# Patient Record
Sex: Female | Born: 1935 | Race: White | Hispanic: No | Marital: Married | Smoking: Former smoker
Health system: Southern US, Community
[De-identification: ages and names within clinical notes are randomized; demographics above are authoritative.]

## PROBLEM LIST (undated history)

## (undated) DIAGNOSIS — Z860100 Personal history of colon polyps, unspecified: Secondary | ICD-10-CM

## (undated) DIAGNOSIS — N951 Menopausal and female climacteric states: Secondary | ICD-10-CM

## (undated) DIAGNOSIS — K5730 Diverticulosis of large intestine without perforation or abscess without bleeding: Secondary | ICD-10-CM

## (undated) DIAGNOSIS — E039 Hypothyroidism, unspecified: Secondary | ICD-10-CM

## (undated) DIAGNOSIS — F319 Bipolar disorder, unspecified: Secondary | ICD-10-CM

## (undated) DIAGNOSIS — G4733 Obstructive sleep apnea (adult) (pediatric): Secondary | ICD-10-CM

## (undated) DIAGNOSIS — F32A Depression, unspecified: Secondary | ICD-10-CM

## (undated) DIAGNOSIS — F329 Major depressive disorder, single episode, unspecified: Secondary | ICD-10-CM

## (undated) DIAGNOSIS — E785 Hyperlipidemia, unspecified: Secondary | ICD-10-CM

## (undated) HISTORY — DX: Major depressive disorder, single episode, unspecified: F32.9

## (undated) HISTORY — DX: Diverticulosis of large intestine without perforation or abscess without bleeding: K57.30

## (undated) HISTORY — DX: Obstructive sleep apnea (adult) (pediatric): G47.33

## (undated) HISTORY — PX: TONSILLECTOMY AND ADENOIDECTOMY: SHX28

## (undated) HISTORY — PX: COLONOSCOPY: SHX174

## (undated) HISTORY — DX: Menopausal and female climacteric states: N95.1

## (undated) HISTORY — DX: Hypothyroidism, unspecified: E03.9

## (undated) HISTORY — DX: Bipolar disorder, unspecified: F31.9

## (undated) HISTORY — PX: POLYPECTOMY: SHX149

## (undated) HISTORY — DX: Hyperlipidemia, unspecified: E78.5

## (undated) HISTORY — DX: Depression, unspecified: F32.A

## (undated) HISTORY — DX: Personal history of colonic polyps: Z86.010

## (undated) HISTORY — PX: APPENDECTOMY: SHX54

## (undated) HISTORY — DX: Personal history of colon polyps, unspecified: Z86.0100

---

## 1998-04-08 ENCOUNTER — Ambulatory Visit (HOSPITAL_COMMUNITY): Admission: RE | Admit: 1998-04-08 | Discharge: 1998-04-08 | Payer: Self-pay | Admitting: Gynecology

## 1999-03-13 ENCOUNTER — Other Ambulatory Visit: Admission: RE | Admit: 1999-03-13 | Discharge: 1999-03-13 | Payer: Self-pay | Admitting: Gynecology

## 2000-02-29 ENCOUNTER — Other Ambulatory Visit: Admission: RE | Admit: 2000-02-29 | Discharge: 2000-02-29 | Payer: Self-pay | Admitting: Gynecology

## 2001-03-03 ENCOUNTER — Other Ambulatory Visit: Admission: RE | Admit: 2001-03-03 | Discharge: 2001-03-03 | Payer: Self-pay | Admitting: Gynecology

## 2001-07-09 ENCOUNTER — Encounter (INDEPENDENT_AMBULATORY_CARE_PROVIDER_SITE_OTHER): Payer: Self-pay | Admitting: Specialist

## 2001-07-10 ENCOUNTER — Inpatient Hospital Stay (HOSPITAL_COMMUNITY): Admission: EM | Admit: 2001-07-10 | Discharge: 2001-07-14 | Payer: Self-pay | Admitting: Emergency Medicine

## 2002-03-04 ENCOUNTER — Other Ambulatory Visit: Admission: RE | Admit: 2002-03-04 | Discharge: 2002-03-04 | Payer: Self-pay | Admitting: Gynecology

## 2003-03-12 ENCOUNTER — Other Ambulatory Visit: Admission: RE | Admit: 2003-03-12 | Discharge: 2003-03-12 | Payer: Self-pay | Admitting: Gynecology

## 2004-03-20 ENCOUNTER — Other Ambulatory Visit: Admission: RE | Admit: 2004-03-20 | Discharge: 2004-03-20 | Payer: Self-pay | Admitting: Gynecology

## 2004-04-19 ENCOUNTER — Ambulatory Visit (HOSPITAL_BASED_OUTPATIENT_CLINIC_OR_DEPARTMENT_OTHER): Admission: RE | Admit: 2004-04-19 | Discharge: 2004-04-19 | Payer: Self-pay | Admitting: Gynecology

## 2004-09-29 ENCOUNTER — Other Ambulatory Visit: Admission: RE | Admit: 2004-09-29 | Discharge: 2004-09-29 | Payer: Self-pay | Admitting: Gynecology

## 2005-04-05 ENCOUNTER — Other Ambulatory Visit: Admission: RE | Admit: 2005-04-05 | Discharge: 2005-04-05 | Payer: Self-pay | Admitting: Gynecology

## 2005-09-24 ENCOUNTER — Other Ambulatory Visit: Admission: RE | Admit: 2005-09-24 | Discharge: 2005-09-24 | Payer: Self-pay | Admitting: Gynecology

## 2006-02-11 ENCOUNTER — Ambulatory Visit: Payer: Self-pay | Admitting: Internal Medicine

## 2006-02-25 ENCOUNTER — Ambulatory Visit: Payer: Self-pay | Admitting: Internal Medicine

## 2006-03-22 ENCOUNTER — Ambulatory Visit: Payer: Self-pay | Admitting: Gastroenterology

## 2006-04-01 ENCOUNTER — Encounter: Payer: Self-pay | Admitting: Internal Medicine

## 2006-04-02 ENCOUNTER — Other Ambulatory Visit: Admission: RE | Admit: 2006-04-02 | Discharge: 2006-04-02 | Payer: Self-pay | Admitting: Gynecology

## 2006-04-05 ENCOUNTER — Encounter: Payer: Self-pay | Admitting: Gastroenterology

## 2006-04-05 ENCOUNTER — Encounter: Payer: Self-pay | Admitting: Internal Medicine

## 2006-04-05 ENCOUNTER — Ambulatory Visit: Payer: Self-pay | Admitting: Gastroenterology

## 2006-04-05 LAB — HM COLONOSCOPY

## 2006-08-28 ENCOUNTER — Ambulatory Visit: Payer: Self-pay | Admitting: Internal Medicine

## 2006-10-01 ENCOUNTER — Other Ambulatory Visit: Admission: RE | Admit: 2006-10-01 | Discharge: 2006-10-01 | Payer: Self-pay | Admitting: Gynecology

## 2007-02-26 ENCOUNTER — Encounter: Payer: Self-pay | Admitting: Internal Medicine

## 2007-02-26 DIAGNOSIS — N951 Menopausal and female climacteric states: Secondary | ICD-10-CM

## 2007-02-26 DIAGNOSIS — F319 Bipolar disorder, unspecified: Secondary | ICD-10-CM

## 2007-02-26 DIAGNOSIS — E039 Hypothyroidism, unspecified: Secondary | ICD-10-CM | POA: Insufficient documentation

## 2007-02-26 DIAGNOSIS — Z86010 Personal history of colonic polyps: Secondary | ICD-10-CM

## 2007-02-26 DIAGNOSIS — K5730 Diverticulosis of large intestine without perforation or abscess without bleeding: Secondary | ICD-10-CM

## 2007-02-26 DIAGNOSIS — E785 Hyperlipidemia, unspecified: Secondary | ICD-10-CM

## 2007-02-27 ENCOUNTER — Encounter: Payer: Self-pay | Admitting: Internal Medicine

## 2007-02-27 ENCOUNTER — Ambulatory Visit: Payer: Self-pay | Admitting: Internal Medicine

## 2007-04-08 ENCOUNTER — Encounter: Payer: Self-pay | Admitting: Internal Medicine

## 2007-09-18 ENCOUNTER — Encounter: Payer: Self-pay | Admitting: Internal Medicine

## 2008-02-26 ENCOUNTER — Ambulatory Visit: Payer: Self-pay | Admitting: Internal Medicine

## 2008-02-26 DIAGNOSIS — F329 Major depressive disorder, single episode, unspecified: Secondary | ICD-10-CM

## 2008-02-26 LAB — CONVERTED CEMR LAB
ALT: 21 units/L (ref 0–35)
AST: 20 units/L (ref 0–37)
Albumin: 3.6 g/dL (ref 3.5–5.2)
BUN: 21 mg/dL (ref 6–23)
Basophils Relative: 0.3 % (ref 0.0–1.0)
Chloride: 106 meq/L (ref 96–112)
Creatinine, Ser: 0.8 mg/dL (ref 0.4–1.2)
Eosinophils Absolute: 0.1 10*3/uL (ref 0.0–0.7)
Eosinophils Relative: 1.7 % (ref 0.0–5.0)
GFR calc non Af Amer: 75 mL/min
HCT: 37.3 % (ref 36.0–46.0)
Hemoglobin: 13.1 g/dL (ref 12.0–15.0)
MCV: 94 fL (ref 78.0–100.0)
Neutrophils Relative %: 60.4 % (ref 43.0–77.0)
RBC: 3.96 M/uL (ref 3.87–5.11)
TSH: 3.04 microintl units/mL (ref 0.35–5.50)
WBC: 6.7 10*3/uL (ref 4.5–10.5)

## 2008-02-27 ENCOUNTER — Telehealth (INDEPENDENT_AMBULATORY_CARE_PROVIDER_SITE_OTHER): Payer: Self-pay

## 2009-02-24 ENCOUNTER — Encounter: Payer: Self-pay | Admitting: Internal Medicine

## 2009-02-25 ENCOUNTER — Ambulatory Visit: Payer: Self-pay | Admitting: Internal Medicine

## 2009-02-25 LAB — CONVERTED CEMR LAB
Albumin: 3.5 g/dL (ref 3.5–5.2)
Basophils Absolute: 0 10*3/uL (ref 0.0–0.1)
CO2: 28 meq/L (ref 19–32)
Chloride: 108 meq/L (ref 96–112)
Eosinophils Absolute: 0.1 10*3/uL (ref 0.0–0.7)
Glucose, Bld: 97 mg/dL (ref 70–99)
HCT: 35.7 % — ABNORMAL LOW (ref 36.0–46.0)
HDL: 36 mg/dL — ABNORMAL LOW (ref >39.00)
Hemoglobin: 12.4 g/dL (ref 12.0–15.0)
Lymphs Abs: 1.9 10*3/uL (ref 0.7–4.0)
MCHC: 34.7 g/dL (ref 30.0–36.0)
MCV: 94.9 fL (ref 78.0–100.0)
Neutro Abs: 3.3 10*3/uL (ref 1.4–7.7)
Potassium: 4.2 meq/L (ref 3.5–5.1)
RDW: 12.6 % (ref 11.5–14.6)
Sodium: 143 meq/L (ref 135–145)
TSH: 10.16 microintl units/mL — ABNORMAL HIGH (ref 0.35–5.50)

## 2009-02-28 ENCOUNTER — Telehealth: Payer: Self-pay | Admitting: Internal Medicine

## 2010-01-10 ENCOUNTER — Encounter: Payer: Self-pay | Admitting: Internal Medicine

## 2010-02-28 ENCOUNTER — Ambulatory Visit: Payer: Self-pay | Admitting: Internal Medicine

## 2010-02-28 DIAGNOSIS — N764 Abscess of vulva: Secondary | ICD-10-CM

## 2010-03-14 ENCOUNTER — Telehealth: Payer: Self-pay

## 2010-04-25 ENCOUNTER — Encounter: Payer: Self-pay | Admitting: Internal Medicine

## 2010-09-17 LAB — CONVERTED CEMR LAB
Albumin: 4 g/dL (ref 3.5–5.2)
Alkaline Phosphatase: 56 units/L (ref 39–117)
Basophils Absolute: 0 10*3/uL (ref 0.0–0.1)
Basophils Relative: 0.2 % (ref 0.0–1.0)
Bilirubin, Direct: 0.1 mg/dL (ref 0.0–0.3)
CO2: 27 meq/L (ref 19–32)
CO2: 28 meq/L (ref 19–32)
Calcium: 9.1 mg/dL (ref 8.4–10.5)
Chloride: 109 meq/L (ref 96–112)
Cholesterol: 164 mg/dL (ref 0–200)
Creatinine, Ser: 0.7 mg/dL (ref 0.4–1.2)
Direct LDL: 103.4 mg/dL
Eosinophils Absolute: 0.1 10*3/uL (ref 0.0–0.6)
Eosinophils Absolute: 0.1 10*3/uL (ref 0.0–0.7)
Eosinophils Relative: 1.4 % (ref 0.0–5.0)
GFR calc Af Amer: 127 mL/min
Glucose, Bld: 93 mg/dL (ref 70–99)
Glucose, Bld: 96 mg/dL (ref 70–99)
HDL: 43.3 mg/dL (ref >39.00)
Hemoglobin: 13.2 g/dL (ref 12.0–15.0)
Hemoglobin: 13.5 g/dL (ref 12.0–15.0)
Lymphocytes Relative: 29.4 % (ref 12.0–46.0)
Lymphocytes Relative: 31.9 % (ref 12.0–46.0)
Lymphs Abs: 2 10*3/uL (ref 0.7–4.0)
MCHC: 34.9 g/dL (ref 30.0–36.0)
MCV: 94.6 fL (ref 78.0–100.0)
MCV: 95 fL (ref 78.0–100.0)
Monocytes Absolute: 0.3 10*3/uL (ref 0.1–1.0)
Monocytes Absolute: 0.5 10*3/uL (ref 0.2–0.7)
Monocytes Relative: 7.9 % (ref 3.0–11.0)
Neutro Abs: 3.5 10*3/uL (ref 1.4–7.7)
Neutro Abs: 4.4 10*3/uL (ref 1.4–7.7)
Potassium: 4 meq/L (ref 3.5–5.1)
RDW: 12.7 % (ref 11.5–14.6)
Sodium: 143 meq/L (ref 135–145)
TSH: 1.57 microintl units/mL (ref 0.35–5.50)
TSH: 2.86 microintl units/mL (ref 0.35–5.50)
Total CK: 307 units/L (ref 7–177)
Total Protein: 6.8 g/dL (ref 6.0–8.3)
Total Protein: 6.9 g/dL (ref 6.0–8.3)
Triglycerides: 142 mg/dL (ref 0.0–149.0)
VLDL: 31 mg/dL (ref 0–40)
WBC: 6 10*3/uL (ref 4.5–10.5)

## 2010-09-21 NOTE — Letter (Signed)
Summary: Gynecology-Dr. Beather Arbour  Gynecology-Dr. Beather Arbour   Imported By: Maryln Gottron 01/17/2010 13:32:49  _____________________________________________________________________  External Attachment:    Type:   Image     Comment:   External Document

## 2010-09-21 NOTE — Assessment & Plan Note (Signed)
Summary: pt will come in fasting/njr   Vital Signs:  Patient profile:   76 year old female Height:      62 inches Weight:      197 pounds BMI:     36.16 Temp:     97.9 degrees F oral BP sitting:   118 / 70  (right arm) Cuff size:   regular  Vitals Entered By: Duard Brady LPN (February 28, 2010 8:33 AM) CC: cpx - doing well  Is Patient Diabetic? No   CC:  cpx - doing well .  History of Present Illness: 76 year old patient who is seen today for a comprehensive evaluation.  She has a history of depression and is followed by psychiatry.  She is followed by Dr. Nicholas Lose for her gynecologic care.  She has mild dyslipidemia, hypothyroidism, and a history of colonic polyps.  She remains on Lipitor 10 mg daily Here for Medicare AWV:  1.   Risk factors based on Past M, S, F history:  personal risk factors include dyslipidemia.  She has a family history of coronary artery disease and a personal history of colonic polyps. 2.   Physical Activities: no activity restrictions, fairly sedentary 3.   Depression/mood: history of bipolar depression, which has been stable followed by psychiatry 4.   Hearing: no hearing deficits 5.   ADL's: completely independent in all aspects of daily living 6.   Fall Risk: low 7.   Home Safety: no problems identified 8.   Height, weight, &visual acuity:no change in high at or weight.  Visual acuity is normal.  Does have annual eye examinations 9.   Counseling: heart healthy diet regular.  Exercise and modest weight loss.  All encouraged 10.   Labs ordered based on risk factors: complete laboratory profile, including lipid panel will be reviewed 11.           Referral Coordination- mammogram this fall 12.           Care Plan- heart healthy diet calorie restricted diet and exercise regimen.  All encouraged 13.            Cognitive Assessment- alert and oriented, with normal affect   Allergies (verified): No Known Drug Allergies  Past History:  Past Medical  History: Colonic polyps, hx of Depression ( Dr Raquel James) Diverticulosis, colon Hyperlipidemia Hypothyroidism menopausal syndrome OSA (CPAP) 17 cm (Clint Young)  Past Surgical History: Reviewed history from 02/26/2008 and no changes required. Appendectomy 2002 Thyroidectomy colonoscopy August 2007  Family History: Reviewed history from 02/26/2008 and no changes required. father died att 41, complications of coronary artery disease mother died at 73-history of diabetes one brother, status post CABG Family History of CAD Female 1st degree relative <50 family history also positive for breast and lung cancer  Social History: Reviewed history from 02/27/2007 and no changes required. married two daughters, 4 grandchildren  Review of Systems  The patient denies anorexia, fever, weight loss, weight gain, vision loss, decreased hearing, hoarseness, chest pain, syncope, dyspnea on exertion, peripheral edema, prolonged cough, headaches, hemoptysis, abdominal pain, melena, hematochezia, severe indigestion/heartburn, hematuria, incontinence, genital sores, muscle weakness, suspicious skin lesions, transient blindness, difficulty walking, depression, unusual weight change, abnormal bleeding, enlarged lymph nodes, angioedema, and breast masses.    Physical Exam  General:  overweight-appearing.  110/72overweight-appearing.   Head:  Normocephalic and atraumatic without obvious abnormalities. No apparent alopecia or balding. Eyes:  No corneal or conjunctival inflammation noted. EOMI. Perrla. Funduscopic exam benign, without hemorrhages, exudates or papilledema. Vision grossly  normal. Ears:  External ear exam shows no significant lesions or deformities.  Otoscopic examination reveals clear canals, tympanic membranes are intact bilaterally without bulging, retraction, inflammation or discharge. Hearing is grossly normal bilaterally. Mouth:  Oral mucosa and oropharynx without lesions or exudates.  Teeth  in good repair. Neck:  No deformities, masses, or tenderness noted. Chest Wall:  No deformities, masses, or tenderness noted. Breasts:  No mass, nodules, thickening, tenderness, bulging, retraction, inflamation, nipple discharge or skin changes noted.   Lungs:  Normal respiratory effort, chest expands symmetrically. Lungs are clear to auscultation, no crackles or wheezes. Heart:  Normal rate and regular rhythm. S1 and S2 normal without gallop, murmur, click, rub or other extra sounds. Abdomen:  Bowel sounds positive,abdomen soft and non-tender without masses, organomegaly or hernias noted. Msk:  No deformity or scoliosis noted of thoracic or lumbar spine.   Pulses:  R and L carotid,radial,femoral,dorsalis pedis and posterior tibial pulses are full and equal bilaterally Extremities:  No clubbing, cyanosis, edema, or deformity noted with normal full range of motion of all joints.   Neurologic:  No cranial nerve deficits noted. Station and gait are normal. Plantar reflexes are down-going bilaterally. DTRs are symmetrical throughout. Sensory, motor and coordinative functions appear intact. Skin:  Intact without suspicious lesions or rashes Cervical Nodes:  No lymphadenopathy noted Axillary Nodes:  No palpable lymphadenopathy Inguinal Nodes:  No significant adenopathy Psych:  Cognition and judgment appear intact. Alert and cooperative with normal attention span and concentration. No apparent delusions, illusions, hallucinations   Impression & Recommendations:  Problem # 1:  HEALTH MAINTENANCE EXAM (ICD-V70.0)  Orders: EKG w/ Interpretation (93000) First annual wellness visit with prevention plan  (Z6109)  Complete Medication List: 1)  Vivelle-dot 0.0375 Mg/24hr Pttw (Estradiol) .Marland Kitchen.. 1 2x week 2)  Lipitor 10 Mg Tabs (Atorvastatin calcium) .Marland Kitchen.. 1 once daily 3)  Divalproex Sodium 250 Mg Tbec (Divalproex sodium) .... 3 once daily 4)  Prometrium 200 Mg Caps (Progesterone micronized) .... Uad 5)   Clonazepam 1 Mg Tabs (Clonazepam) .Marland Kitchen.. 1 at bedtime 6)  Levothyroxine Sodium 100 Mcg Tabs (Levothyroxine sodium) .Marland Kitchen.. 1 once daily 7)  Prometrium 200 Mg Caps (Progesterone micronized) .... As directed  Other Orders: Venipuncture ([address]) TLB-Lipid Panel (80061-LIPID) TLB-BMP (Basic Metabolic Panel-BMET) (80048-METABOL) TLB-CBC Platelet - w/Differential (85025-CBCD) TLB-Hepatic/Liver Function Pnl (80076-HEPATIC) TLB-TSH (Thyroid Stimulating Hormone) (09811-BJY)  Patient Instructions: 1)  Limit your Sodium (Salt). 2)  It is important that you exercise regularly at least 20 minutes 5 times a week. If you develop chest pain, have severe difficulty breathing, or feel very tired , stop exercising immediately and seek medical attention. 3)  You need to lose weight. Consider a lower calorie diet and regular exercise.  4)  Take 400-600mg  of Ibuprofen (Advil, Motrin) with food every 4-6 hours as needed for relief of pain or comfort of fever. Prescriptions: LEVOTHYROXINE SODIUM 100 MCG TABS (LEVOTHYROXINE SODIUM) 1 once daily  #90.0 Each x 6   Entered and Authorized by:   Gordy Savers  MD   Signed by:   Gordy Savers  MD on 02/28/2010   Method used:   Electronically to        [facility] [id number]* (retail)       [address] [place], [place]  [address]       Ph: [phone]       Fax: [phone]   RxID:   [id number] LIPITOR 10 MG  TABS (ATORVASTATIN  CALCIUM) 1 once daily  #90.0 Each x 6   Entered and Authorized by:   Gordy Savers  MD   Signed by:   Gordy Savers  MD on 02/28/2010   Method used:   Electronically to        [facility] [id number]* (retail)       [address] [place], [place]  [address]       Ph: [phone]       Fax: [phone]   RxID:   [id number]   Appended Document: pt will come in fasting/njr Addendum:      1.  Depression- contiue psychiatric f/u      2.  Dyslipidemia-continue lipitor      3.   Hypothyroidism- continue levothyroxine

## 2010-09-21 NOTE — Progress Notes (Signed)
Summary: labwork mail  Phone Note Call from Patient Call back at Home Phone [phone]   Caller: Patient Call For: Gordy Savers  MD Summary of Call: Pt would like a copy of labwork mail to her. Initial call taken by: Heron Sabins,  March 14, 2010 9:38 AM  Follow-up for Phone Call        labs mailed to home address as requestedKIK Follow-up by: Duard Brady LPN,  March 14, 2010 10:11 AM

## 2010-10-12 ENCOUNTER — Other Ambulatory Visit: Payer: Self-pay | Admitting: Gynecology

## 2011-01-05 NOTE — Op Note (Signed)
[facility]  Patient:    Emily Castaneda, Emily Castaneda Visit Number: [id number] MRN: [id number]          Service Type: SUR Location: 3W 0356 01 Attending Physician:  Meredith Leeds Dictated by:   Zigmund Daniel, M.D. Proc. Date: 07/09/01 Admit Date:  07/09/2001                             Operative Report  PREOPERATIVE DIAGNOSIS:  Acute appendicitis.  POSTOPERATIVE DIAGNOSIS:  Acute appendicitis with perforation and peritonitis.  OPERATION:  Appendectomy.  SURGEON:  Zigmund Daniel, M.D.  ANESTHESIA:  General.  DESCRIPTION OF PROCEDURE:  After the patient was anesthetized, monitored, and had a Foley catheter, I palpated the abdomen, and found a large mass in the right lower quadrant.  It was evident to me that laparoscopic surgery would be difficult and hazardous, so I made a lower midline incision, and entered the abdomen, and removed fluid which was present.  It was cloudy, slightly smelly fluid.  There was obvious peritonitis around the small bowel and right lower quadrant, but no feces was noted.  I found inflammation in the right lower quadrant.  The sigmoid appeared normal.  There was an obvious perforated appendix.  I mobilized the cecum by incising peritoneum to it, and inferior to it, and got it mobilized enough to see the acutely inflamed and perforated appendix.  It was partly walled off by the ileum, so the peritonitis was less than it could have been by the localized reaction.  I clamped the mesoappendix with a Kelly clamp and divided it, and suture ligated the appendiceal artery with 2-0 Vicryl.  I suture ligated the base of the appendix with a 2-0 Vicryl suture ligature.  I amputated the appendix and cauterized the mucosa of the stump.  The appendix was healthy at the base and the appearance of this appeared secure.  I got good hemostasis and then copiously irrigated the local area.  I irrigated the abdominal cavity with about 2 L  of fluid and removed the irrigant.  Hemostasis was good.  Sponge, needle, and instrument counts were correct.  No other abdominal abnormalities were noted.  I closed the fascia with running #1 PDS and loosely closed the skin with 3-0 nylon, packing gauze into the _________ between the closure.  The patient tolerated the operation well. Dictated by:   Zigmund Daniel, M.D. Attending Physician:  Meredith Leeds DD:  07/09/01 TD:  07/10/01 Job: [id number]/[id number]

## 2011-01-05 NOTE — Letter (Signed)
December 12, 2006     RE:  ZARRIA, TOWELL  MRN:  [id number]  /  DOB:  [birth year]   To whom it may concern,   Ladesha Pacini. Woodbeck is a patient followed in our internal medicine practice.  She has a number of medical problems including bipolar disorder and is  treated with multiple drugs.  This office feels that she may benefit  from a driving school to assist with safety issues.    Sincerely,      Gordy Savers, MD  Electronically Signed    PFK/MedQ  DD: 12/12/2006  DT: 12/12/2006  Job #: [id number]

## 2011-01-05 NOTE — Discharge Summary (Signed)
[facility]  Patient:    LAVETA, GILKEY Visit Number: [id number] MRN: [id number]          Service Type: SUR Location: 3W 0356 01 Attending Physician:  Meredith Leeds Dictated by:   Zigmund Daniel, M.D. Admit Date:  07/09/2001 Discharge Date: 07/14/2001                             Discharge Summary  HISTORY OF PRESENT ILLNESS:  The patient is a 76 year old white female with several days of increasing abdominal pain.  She was found to have tenderness in the right lower quadrant and a white count of 28,000.  Appendicitis or some other acute abdominal process was suspected, and I was consulted.  I agreed, and advised exploration with probable appendectomy.  The patients general health is quite good.  She suffers from bipolar illness, and that has been stable lately.  HOSPITAL COURSE:  After fluid resuscitation and administration of IV antibiotics, I took the patient to the operating room, and after she was anesthetized I found that there was a very large right lower quadrant mass.  I explored her through a lower midline incision, finding acute appendicitis with perforation, abscess, and peritonitis.  I was able to do an appendectomy rather then a colectomy, and then copiously irrigated the abdominal cavity. The patient did quite well postoperatively with pretty early return of GI function, and was eating and feeling well by the fourth postoperative day. Her bowels had moved.  She was afebrile, and white count had returned to normal.  I sent her home after removal of the sutures, and application of Steri-Strips.  She was given pain medication and oral antibiotic.  She was to call if there was a fever, wound redness or drainage, or other problem.  DIAGNOSIS:  Acute appendicitis with abscess and peritonitis.  OPERATION:  Appendectomy.  CONDITION ON DISCHARGE:  Improved. Dictated by:   Zigmund Daniel, M.D. Attending Physician:  Meredith Leeds DD:  07/24/01 TD:  07/24/01 Job: [id number]/[id number]

## 2011-01-05 NOTE — H&P (Signed)
[facility]  Patient:    Emily Castaneda, Emily Castaneda Visit Number: [id number] MRN: [id number]          Service Type: SUR Location: 3W 0356 01 Attending Physician:  Meredith Leeds Dictated by:   Zigmund Daniel, M.D. Admit Date:  07/09/2001 Discharge Date: 07/14/2001                           History and Physical  CHIEF COMPLAINT:  Abdominal pain.  HISTORY OF PRESENT ILLNESS:  The patient is a 76 year old white female with a white count of 28,000, and right lower quadrant pain and tenderness progressive over a few days.  There was also anorexia, previous nausea, vomiting, and diarrhea.  She had no chronic GI disease.  PAST MEDICAL HISTORY:  Bipolar illness, and is on Depakote, klonopin, and she also take Synthroid for hypothyroidism.  SOCIAL HISTORY:  She does not smoke.  She drinks socially and moderately.  She denies serious chronic aliments.  Review of systems, family history, childhood illnesses, unremarkable.  PHYSICAL EXAMINATION:  GENERAL:  The patient is a little bit overweight.  Mental status was normal.  VITAL SIGNS:  Normal as recorded by the nurse.  HEENT:  Unremarkable.  NECK:  Unremarkable.  CHEST:  Clear to auscultation.  BREASTS:  Normal.  HEART:  Regular rate and rhythm.  No murmur or gallop.  ABDOMEN:  Tender in the right lower quadrant with rebound tenderness.  RECTAL:  Not performed.  PELVIC:  Not performed.  The remainder of the examination unremarkable.  IMPRESSION:  Acute appendicitis.  PLAN:  Appendectomy. Dictated by:   Zigmund Daniel, M.D. Attending Physician:  Meredith Leeds DD:  07/30/01 TD:  07/30/01 Job: [id number]

## 2011-01-05 NOTE — Procedures (Signed)
NAME:  BRINLYN, CENA              ACCOUNT NO.:  [id number]   MEDICAL RECORD NO.:  [id number]          PATIENT TYPE:  OUT   LOCATION:  SLEEP CENTER                 FACILITY:  [facility]   PHYSICIAN:  Clinton D. Maple Hudson, M.D. DATE OF BIRTH:  03/16/1935   DATE OF ADMISSION:  04/19/2004  DATE OF DISCHARGE:  04/19/2004                              NOCTURNAL POLYSOMNOGRAM   REFERRING PHYSICIAN:  Luvenia Redden, M.D.   INDICATIONS FOR STUDY/HISTORY:  Hypersomnia with sleep apnea.  Excessive  daytime somnolence.  Complaint of insufficient energy with additional  diagnosis of bipolar disorder.  An Epworth Sleepiness Scale of 11/24.  Neck  size 20 inches.  BMI 32.8.  Weight 183 pounds.   MEDICATIONS:  Depakote, Lipitor, Synthroid, clonazepam, aspirin, Tylenol,  multivitamins.   SLEEP ARCHITECTURE:  Total sleep time 301 minutes with sleep efficiency of  77%.  Stage I was 20%.  Stage II 64%.  Stages III and IV absent.  REM was  16% of total sleep time.  Latency to sleep onset 30 minutes.  Latency to REM  217 minutes.  Awake after sleep onset 59 minutes.  Arousal index 46.   RESPIRATORY DATA:  Split study protocol.  RDI 75 per hour before CPAP,  indicating severe obstructive sleep apnea/hypopnea syndrome.  This included  137 obstructive apneas and 39 hypopneas before CPAP.  The events were not  positional.  REM RDI was 50 per hour.  CPAP was titrated to suggested  initial home pressure of 17 CWP (RDI 0 per hour).  The technician took her  up to 20 CWP because of some breakthrough snoring.  That experience suggests  that comfort would be difficult to maintain at higher pressures, and an  initial home trial of 17 CWP is recommended.  A medium Respiron full-face  mask was utilized with a heated humidifier.   OXYGEN DATA:  Moderately loud snoring before CPAP with moderate oxygen  desaturation to 77%.  After CPAP oxygen saturation held at 93-95%.   CARDIAC DATA:  Sinus rhythm with occasional PAC.   MOVEMENT/PARASOMNIA:  Occasional leg jerk without significant effect on  sleep.   IMPRESSION/RECOMMENDATIONS:  Severe obstructive sleep apnea/hypopnea  syndrome, Respiratory Disturbance Index of 75 per hour with moderate oxygen  desaturation and loud snoring.  CPAP control was achieved at 17 CWP using a  medication Respironics comfort gel, full face mask with heated humidifier.                                  ______________________________                                Rennis Chris Maple Hudson, M.D.                                Diplomate, American Board of Sleep Medicine   CDY/MEDQ  D:  04/23/2004 13:27:03  T:  04/24/2004 19:53:08  Job:  [id number]

## 2011-01-23 ENCOUNTER — Other Ambulatory Visit: Payer: Self-pay | Admitting: Gynecology

## 2011-03-12 ENCOUNTER — Encounter: Payer: Self-pay | Admitting: Internal Medicine

## 2011-03-13 ENCOUNTER — Encounter: Payer: Self-pay | Admitting: Internal Medicine

## 2011-03-13 ENCOUNTER — Ambulatory Visit (INDEPENDENT_AMBULATORY_CARE_PROVIDER_SITE_OTHER): Payer: Medicare Other | Admitting: Internal Medicine

## 2011-03-13 VITALS — BP 110/70 | HR 72 | Temp 97.5°F | Resp 16 | Ht 63.0 in | Wt 193.0 lb

## 2011-03-13 DIAGNOSIS — Z860100 Personal history of colon polyps, unspecified: Secondary | ICD-10-CM

## 2011-03-13 DIAGNOSIS — Z0000 Encounter for general adult medical examination without abnormal findings: Secondary | ICD-10-CM

## 2011-03-13 DIAGNOSIS — E785 Hyperlipidemia, unspecified: Secondary | ICD-10-CM

## 2011-03-13 DIAGNOSIS — E039 Hypothyroidism, unspecified: Secondary | ICD-10-CM

## 2011-03-13 DIAGNOSIS — N951 Menopausal and female climacteric states: Secondary | ICD-10-CM

## 2011-03-13 DIAGNOSIS — F3289 Other specified depressive episodes: Secondary | ICD-10-CM

## 2011-03-13 DIAGNOSIS — F329 Major depressive disorder, single episode, unspecified: Secondary | ICD-10-CM

## 2011-03-13 DIAGNOSIS — K5730 Diverticulosis of large intestine without perforation or abscess without bleeding: Secondary | ICD-10-CM

## 2011-03-13 LAB — CBC WITH DIFFERENTIAL/PLATELET
Basophils Absolute: 0 10*3/uL (ref 0.0–0.1)
Eosinophils Absolute: 0.1 10*3/uL (ref 0.0–0.7)
HCT: 38.1 % (ref 36.0–46.0)
Lymphs Abs: 1.7 10*3/uL (ref 0.7–4.0)
MCV: 94.6 fl (ref 78.0–100.0)
Monocytes Absolute: 0.4 10*3/uL (ref 0.1–1.0)
Neutrophils Relative %: 66.8 % (ref 43.0–77.0)
Platelets: 175 10*3/uL (ref 150.0–400.0)
RDW: 13.8 % (ref 11.5–14.6)

## 2011-03-13 LAB — HEPATIC FUNCTION PANEL
ALT: 21 U/L (ref 0–35)
AST: 21 U/L (ref 0–37)
Albumin: 3.8 g/dL (ref 3.5–5.2)
Alkaline Phosphatase: 43 U/L (ref 39–117)
Bilirubin, Direct: 0.1 mg/dL (ref 0.0–0.3)
Total Protein: 7 g/dL (ref 6.0–8.3)

## 2011-03-13 LAB — BASIC METABOLIC PANEL
BUN: 18 mg/dL (ref 6–23)
Calcium: 8.5 mg/dL (ref 8.4–10.5)
Chloride: 110 mEq/L (ref 96–112)
Creatinine, Ser: 0.7 mg/dL (ref 0.4–1.2)
GFR: 86.9 mL/min (ref >60.00)

## 2011-03-13 LAB — LIPID PANEL
Cholesterol: 154 mg/dL (ref 0–200)
Triglycerides: 169 mg/dL — ABNORMAL HIGH (ref 0.0–149.0)

## 2011-03-13 LAB — TSH: TSH: 2.82 u[IU]/mL (ref 0.35–5.50)

## 2011-03-13 MED ORDER — ATORVASTATIN CALCIUM 10 MG PO TABS: 10.0000 mg | ORAL_TABLET | Freq: Every day | ORAL | Status: DC

## 2011-03-13 MED ORDER — LEVOTHYROXINE SODIUM 100 MCG PO TABS: 100.0000 ug | ORAL_TABLET | Freq: Every day | ORAL | Status: DC

## 2011-03-13 NOTE — Progress Notes (Signed)
  Subjective:    Patient ID: Emily Castaneda, female    DOB: 1934-10-12, 76 y.o.   MRN: [id number]  HPI CC: cpx - doing well .   History of Present Illness:   76 year-old patient who is seen today for a comprehensive evaluation. She has a history of depression and is followed by psychiatry. She is followed by Dr. Nicholas Lose for her gynecologic care. She has mild dyslipidemia, hypothyroidism, and a history of colonic polyps. She remains on Lipitor 10 mg daily   Here for Medicare AWV:   1. Risk factors based on Past M, S, F history: personal risk factors include dyslipidemia. She has a family history of coronary artery disease and a personal history of colonic polyps.  2. Physical Activities: no activity restrictions, fairly sedentary  3. Depression/mood: history of bipolar depression, which has been stable followed by psychiatry  4. Hearing: no hearing deficits  5. ADL's: completely independent in all aspects of daily living  6. Fall Risk: low  7. Home Safety: no problems identified  8. Height, weight, &visual acuity:no change in high at or weight. Visual acuity is normal. Does have annual eye examinations  9. Counseling: heart healthy diet regular. Exercise and modest weight loss. All encouraged  10. Labs ordered based on risk factors: complete laboratory profile, including lipid panel will be reviewed  11. Referral Coordination- mammogram this fall  12. Care Plan- heart healthy diet calorie restricted diet and exercise regimen. All encouraged  13. Cognitive Assessment- alert and oriented, with normal affect   Allergies (verified):  No Known Drug Allergies   Past History:  Past Medical History:  Colonic polyps, hx of  Depression ( Dr Raquel James)  Diverticulosis, colon  Hyperlipidemia  Hypothyroidism  menopausal syndrome  OSA (CPAP) 17 cm (Clint Young)   Past Surgical History:  Reviewed history from 02/26/2008 and no changes required.  Appendectomy 2002  Thyroidectomy  colonoscopy  August 2007   Family History:  Reviewed history from 02/26/2008 and no changes required.  father died att 56, complications of coronary artery disease  mother died at 73-history of diabetes  one brother, status post CABG  Family History of CAD Female 1st degree relative <50  family history also positive for breast and lung cancer   Social History:  Reviewed history from 02/27/2007 and no changes required.  married two daughters, 4 grandchildren    Review of Systems     Objective:   Physical Exam        Assessment & Plan:

## 2011-03-13 NOTE — Patient Instructions (Signed)
Limit your sodium (Salt) intake  You need to lose weight.  Consider a lower calorie diet and regular exercise.    It is important that you exercise regularly, at least 20 minutes 3 to 4 times per week.  If you develop chest pain or shortness of breath seek  medical attention.  Schedule your colonoscopy to help detect colon cancer.  Return in one year for follow-up

## 2011-03-14 NOTE — Progress Notes (Signed)
Quick Note:  Copy faxed to Dr. Raquel James ______

## 2011-03-19 ENCOUNTER — Other Ambulatory Visit: Payer: Self-pay

## 2011-03-19 NOTE — Telephone Encounter (Signed)
rx sent in on 03/15/11 90x6 rf---verified.

## 2011-04-30 ENCOUNTER — Encounter: Payer: Self-pay | Admitting: Internal Medicine

## 2011-05-17 ENCOUNTER — Ambulatory Visit (HOSPITAL_BASED_OUTPATIENT_CLINIC_OR_DEPARTMENT_OTHER)
Admission: RE | Admit: 2011-05-17 | Discharge: 2011-05-17 | Disposition: A | Discharge status: Home / Self Care | Payer: Medicare Other | Source: Ambulatory Visit | Attending: Gynecology | Admitting: Gynecology

## 2011-05-17 ENCOUNTER — Other Ambulatory Visit: Payer: Self-pay | Admitting: Gynecology

## 2011-05-17 DIAGNOSIS — N950 Postmenopausal bleeding: Secondary | ICD-10-CM | POA: Insufficient documentation

## 2011-05-17 DIAGNOSIS — F319 Bipolar disorder, unspecified: Secondary | ICD-10-CM | POA: Insufficient documentation

## 2011-05-17 DIAGNOSIS — E039 Hypothyroidism, unspecified: Secondary | ICD-10-CM | POA: Insufficient documentation

## 2011-05-17 LAB — POCT HEMOGLOBIN-HEMACUE: Hemoglobin: 12.8 g/dL (ref 12.0–15.0)

## 2011-05-23 NOTE — Op Note (Signed)
  [name], Emily Castaneda                ACCOUNT NO.:  [id number]  MEDICAL RECORD NO.:  [id number]  LOCATION:                                 FACILITY:  PHYSICIAN:  Gretta Cool, M.D. DATE OF BIRTH:  1934-11-27  DATE OF PROCEDURE:  05/17/2011 DATE OF DISCHARGE:                              OPERATIVE REPORT   PREOPERATIVE DIAGNOSES: 1. Recurrent postmenopausal bleeding. 2. History of hysteroscopy, resection of endometrial polyp and     ablation in 1999. 3. Negative endometrial sampling outpatient office, but suspicious     endometrial ultrasound.  POSTOPERATIVE DIAGNOSES: 1. Recurrent postmenopausal bleeding. 2. History of hysteroscopy, resection of endometrial polyp and     ablation in 1999. 3. Negative endometrial sampling outpatient office, but suspicious     endometrial ultrasound.  PROCEDURE:  Hysteroscopy with resection of endometrial tissue, suspicious for hyperplasia or more.  SURGEON:  Gretta Cool, MD  ANESTHESIA:  IV sedation and paracervical block.  DESCRIPTION OF PROCEDURE:  Under excellent paracervical block and sedation as above, the patient prepped and draped in Allen stirrups. The cervix was grasped with a single-tooth tenaculum and pulled down into view.  It was progressively dilated with series Pratt dilators to accommodate 5 mm resectoscope.  The resectoscope was introduced and the cavity examined.  There was much loculation of the cavity, but there was endometrial looking tissue protruding from the apical areas.  That was resected and that opened up further endometrial cavity expose to the view of the hysteroscope.  That tissue was also resected as possible and submitted for pathologic exam.  Tissue was felt to be suspicious for endometrial hyperplasia or more.  At this point, the procedure was terminated without complication.  The patient returned to recovery room in excellent condition.          ______________________________ Gretta Cool, M.D.     CWL/MEDQ  D:  05/17/2011  T:  05/17/2011  Job:  [id number]  cc:   Gordy Savers, MD [address] [place] [address]  Electronically Signed by Beather Arbour M.D. on 05/23/2011 03:58:36 PM

## 2011-05-29 ENCOUNTER — Other Ambulatory Visit: Payer: Self-pay | Admitting: Internal Medicine

## 2011-09-07 ENCOUNTER — Telehealth: Payer: Self-pay | Admitting: Internal Medicine

## 2011-09-07 NOTE — Telephone Encounter (Signed)
i spoke with pt and she states she needs new cpap supplies from [facility]. Pt has not been seen in over 10 years by CDY. I advised pt she will need consult apt to get new supplies. Pt is scheduled to see RA 09/24/11 at 10:15. Pt aware to arrive 15 min early to fill out paper work and is aware of the address. Nothing further was needed

## 2011-09-24 ENCOUNTER — Ambulatory Visit (INDEPENDENT_AMBULATORY_CARE_PROVIDER_SITE_OTHER): Payer: Medicare Other | Admitting: Pulmonary Disease

## 2011-09-24 ENCOUNTER — Encounter: Payer: Self-pay | Admitting: Pulmonary Disease

## 2011-09-24 VITALS — BP 126/78 | HR 95 | Temp 98.2°F | Ht 63.0 in | Wt 194.0 lb

## 2011-09-24 DIAGNOSIS — Z9989 Dependence on other enabling machines and devices: Secondary | ICD-10-CM

## 2011-09-24 DIAGNOSIS — G4733 Obstructive sleep apnea (adult) (pediatric): Secondary | ICD-10-CM

## 2011-09-24 NOTE — Progress Notes (Signed)
  Subjective:    Patient ID: Emily Castaneda, female    DOB: 1935/02/06, 77 y.o.   MRN: [id number]  HPI PCP - [facility] - Jeralyn Bennett  76/F, bipolar disorder , hypothyroidism presents for management of OSA  PSG 8/05 showed severe OSA with RDI 75/h corrected by CPAP 17 cm ESS 4/24 Takes klonopin at bedtime, occ takes lunesta  Bedtime 10-30 -11pm, latency 20-30 mins, few nocturnal awakenings, no nocturia, oob at 0700, rested , no dry mouth, no headaches She has been compliant with CPAP with nasal pillows -but needs new hose, pillows. Her humidifer shows crusting & filter cover was taped up. She denies excessive daytime somnolence , dryness, pressure is ok - she used other interfaces before settling on nasal pillows. There is no history suggestive of cataplexy, sleep paralysis or parasomnias     Review of Systems  Constitutional: Negative for fever and unexpected weight change.  HENT: Positive for congestion, rhinorrhea, sneezing, postnasal drip and sinus pressure. Negative for ear pain, nosebleeds, sore throat, trouble swallowing and dental problem.   Eyes: Positive for itching. Negative for redness.  Respiratory: Negative for cough, chest tightness, shortness of breath and wheezing.   Cardiovascular: Negative for palpitations and leg swelling.  Gastrointestinal: Negative for nausea, vomiting and diarrhea.  Genitourinary: Negative for dysuria.  Musculoskeletal: Negative for joint swelling.  Skin: Negative for rash.  Neurological: Negative for headaches.  Hematological: Does not bruise/bleed easily.  Psychiatric/Behavioral: Positive for dysphoric mood. The patient is not nervous/anxious.        Objective:   Physical Exam  Gen. Pleasant, obese, in no distress, normal affect ENT - no lesions, no post nasal drip, class 2-3 airway Neck: No JVD, no thyromegaly, no carotid bruits Lungs: no use of accessory muscles, no dullness to percussion, decreased without rales or rhonchi    Cardiovascular: Rhythm regular, heart sounds  normal, no murmurs or gallops, no peripheral edema Abdomen: soft and non-tender, no hepatosplenomegaly, BS normal. Musculoskeletal: No deformities, no cyanosis or clubbing Neuro:  alert, non focal, no tremors       Assessment & Plan:

## 2011-09-24 NOTE — Patient Instructions (Signed)
We will get you CPAP supplies for your machine

## 2011-09-24 NOTE — Assessment & Plan Note (Signed)
Will get her new supplies - ct CPAP 17 cm with nasal pillows, chk download in 1 mnth Went over care of machine including filter & humidifier - she may need a new one Weight loss encouraged, compliance with goal of at least 4-6 hrs every night is the expectation. Advised against medications with sedative side effects Cautioned against driving when sleepy - understanding that sleepiness will vary on a day to day basis

## 2011-11-30 ENCOUNTER — Other Ambulatory Visit: Payer: Self-pay | Admitting: Internal Medicine

## 2012-02-01 ENCOUNTER — Other Ambulatory Visit: Payer: Self-pay | Admitting: Gynecology

## 2012-02-28 ENCOUNTER — Other Ambulatory Visit: Payer: Self-pay | Admitting: Internal Medicine

## 2012-03-04 ENCOUNTER — Telehealth: Payer: Self-pay | Admitting: Pulmonary Disease

## 2012-03-04 DIAGNOSIS — G4733 Obstructive sleep apnea (adult) (pediatric): Secondary | ICD-10-CM

## 2012-03-04 NOTE — Telephone Encounter (Signed)
Pt called back.  Pt stated she is concerned about obtaining her new CPAP machine.  Pt has had current CPAP machine for the past 6 years.  Pt stated that [facility] has provided her w/ a re-built motor for the time being.  Pt is going out of town on March 16, 2012 & would like to ensure she has a new CPAP prior to this date.  Advised pt that a nurse will call her back before COB today.    Antionette Fairy

## 2012-03-04 NOTE — Telephone Encounter (Signed)
Spoke with pt. She states that she is needing order sent to [facility] for a new CPAP machine. She states that her machine is 75 yrs old.  RA, please advise if okay to send order thanks!

## 2012-03-04 NOTE — Telephone Encounter (Signed)
Ok to send order. 

## 2012-03-20 ENCOUNTER — Telehealth: Payer: Self-pay

## 2012-03-20 DIAGNOSIS — E039 Hypothyroidism, unspecified: Secondary | ICD-10-CM

## 2012-03-20 MED ORDER — LEVOTHYROXINE SODIUM 100 MCG PO TABS: 100.0000 ug | ORAL_TABLET | Freq: Every day | ORAL | Status: DC

## 2012-03-20 NOTE — Telephone Encounter (Signed)
Needs to be seen - last seen 02/2011

## 2012-03-28 ENCOUNTER — Encounter: Payer: Medicare Other | Admitting: Internal Medicine

## 2012-04-01 ENCOUNTER — Encounter: Payer: Medicare Other | Admitting: Internal Medicine

## 2012-04-07 ENCOUNTER — Telehealth: Payer: Self-pay | Admitting: Internal Medicine

## 2012-04-07 ENCOUNTER — Encounter: Payer: Self-pay | Admitting: Family Medicine

## 2012-04-07 ENCOUNTER — Ambulatory Visit (INDEPENDENT_AMBULATORY_CARE_PROVIDER_SITE_OTHER): Payer: Medicare Other | Admitting: Family Medicine

## 2012-04-07 VITALS — BP 110/62 | HR 102 | Temp 99.2°F | Wt 192.0 lb

## 2012-04-07 DIAGNOSIS — J40 Bronchitis, not specified as acute or chronic: Secondary | ICD-10-CM

## 2012-04-07 MED ORDER — AMOXICILLIN-POT CLAVULANATE 875-125 MG PO TABS: 1.0000 | ORAL_TABLET | Freq: Two times a day (BID) | ORAL | Status: AC

## 2012-04-07 NOTE — Telephone Encounter (Signed)
Caller: Gene/Spouse; Patient Name: Emily Castaneda; PCP: Eleonore Chiquito; Best Callback Phone Number: [phone].  Pt reports she has been diagnosed with Acute Bronchitis (pt was seen at [facility]) and she hasnt slept since 03/26/12.  She reports continuous coughing and stopped up on left side.  Pt states the medications she was prescribed are not working.  All emergent symptoms of Upper Respiratory Infection Protocol ruled out with exception to 'Symptoms worsen after 7 days or symptoms do not improve after 14 days of home care'.  Home care advice given and appt scheduled for 11:15 with Dr Clent Ridges

## 2012-04-07 NOTE — Progress Notes (Signed)
  Subjective:    Patient ID: Emily Castaneda, female    DOB: Jul 24, 1935, 77 y.o.   MRN: [id number]  HPI Here for 12 days of stuffy head, PND, ST, chest congestion, and coughing up green sputum. Low grade fevers. She got sick while on a cruise through [place], [place] and then she flew home. She was seen at [facility] on 04-03-12 and was felt to have a viral infection. Her WBC was normal. She was given cough syrup, an albuterol inhaler, and a prednisone taper. She is no better at all. No NVD. Drinking lots of fluids.    Review of Systems  Constitutional: Positive for fever.  HENT: Positive for congestion, sore throat and postnasal drip.   Eyes: Negative.   Respiratory: Positive for cough, chest tightness and wheezing.   Cardiovascular: Negative.        Objective:   Physical Exam  Constitutional: She appears well-developed and well-nourished.       Coughing frequently  HENT:  Right Ear: External ear normal.  Left Ear: External ear normal.  Nose: Nose normal.  Mouth/Throat: Oropharynx is clear and moist.  Eyes: Conjunctivae are normal.  Neck: No thyromegaly present.  Cardiovascular: Normal rate, regular rhythm, normal heart sounds and intact distal pulses.   Pulmonary/Chest: Effort normal. She has no rales.       Scattered rhonchi and wheezes   Lymphadenopathy:    She has no cervical adenopathy.          Assessment & Plan:  Given Augmentin. Recheck prn

## 2012-04-10 ENCOUNTER — Encounter: Payer: Medicare Other | Admitting: Internal Medicine

## 2012-04-22 ENCOUNTER — Telehealth: Payer: Self-pay | Admitting: Internal Medicine

## 2012-04-22 NOTE — Telephone Encounter (Signed)
Caller: Eugene/Spouse; PCP: Eleonore Chiquito (Family Practice); CB#: [phone]; Call regarding Sore Throat   04-22-12  he states that she has had a sore throat and hearing loss onset about 03-20-12, he wants to know if they should come back and see the doctor they saw or see Dr Kirtland Bouchard or see specialist.  She has no fever, she is able to eat and drinkl  They just need direction of where to go for further care.  I suggested seeing Dr Kirtland Bouchard and if he felt she needed specialist then he could get her set up with one.   He is agreeable to that.  Appointment made for 04-23-12 at 1015 with Dr Kirtland Bouchard

## 2012-04-23 ENCOUNTER — Encounter: Payer: Self-pay | Admitting: Internal Medicine

## 2012-04-23 ENCOUNTER — Ambulatory Visit (INDEPENDENT_AMBULATORY_CARE_PROVIDER_SITE_OTHER): Payer: Medicare Other | Admitting: Internal Medicine

## 2012-04-23 VITALS — BP 100/80 | Temp 97.6°F | Wt 191.0 lb

## 2012-04-23 DIAGNOSIS — E039 Hypothyroidism, unspecified: Secondary | ICD-10-CM

## 2012-04-23 DIAGNOSIS — E785 Hyperlipidemia, unspecified: Secondary | ICD-10-CM

## 2012-04-23 DIAGNOSIS — H6590 Unspecified nonsuppurative otitis media, unspecified ear: Secondary | ICD-10-CM

## 2012-04-23 NOTE — Progress Notes (Signed)
Subjective:    Patient ID: Emily Castaneda, female    DOB: 1934/12/25, 77 y.o.   MRN: [id number]  HPI  77 year old patient who is seen today for followup. She developed an acute illness on approximately August 7 when out  of the country enjoying a cruise in [place]. She presented at that time with a severe sore throat. She was seen at both at [facility] ER and more recently in this office and was treated with Augmentin earlier. She has no fever. Her sore throat has resolved. Her chief complaint today is fullness in the left ear as well as some weakness. Her husband will be undergoing surgery for repair of a AAA later this month she also complains of diminished auditory acuity on the left. Stable medical problems include depression dyslipidemia and hypothyroidism  Past Medical History  Diagnosis Date  . Hx of colonic polyps   . Depression     Dr. Raquel James  . Diverticulosis of colon   . Hyperlipidemia   . Hypothyroidism   . Menopausal syndrome   . OSA (obstructive sleep apnea)     cpap; 17 cm (clint young)  . Endometriosis     History   Social History  . Marital Status: Married    Spouse Name: N/A    Number of Children: N/A  . Years of Education: N/A   Occupational History  . Not on file.   Social History Main Topics  . Smoking status: Former Smoker    Types: Cigarettes    Quit date: 08/21/1983  . Smokeless tobacco: Never Used   Comment: only smoked socially  . Alcohol Use: Yes     occ glass of wine   . Drug Use: No  . Sexually Active: Not on file   Other Topics Concern  . Not on file   Social History Narrative  . No narrative on file    Past Surgical History  Procedure Date  . Polypectomy     Dr. Nicholas Lose    Family History  Problem Relation Age of Onset  . Diabetes Mother   . Heart disease Father     complications of coronary heart disease  . Heart disease Other   . Cancer Other     BREAST AND LUNG     No Known Allergies  Current Outpatient Prescriptions on  File Prior to Visit  Medication Sig Dispense Refill  . atorvastatin (LIPITOR) 10 MG tablet TAKE 1 TABLET BY MOUTH ONCE DAILY  90 tablet  3  . atorvastatin (LIPITOR) 10 MG tablet TAKE 1 TABLET BY MOUTH DAILY  90 tablet  0  . Cholecalciferol (VITAMIN D3) 1000 UNITS CAPS Take 1 capsule by mouth daily.      . clonazePAM (KLONOPIN) 1 MG tablet Take 1 mg by mouth at bedtime.       . divalproex (DEPAKOTE) 250 MG EC tablet Take 250 mg by mouth. 5 tablets at bedtime      . estradiol (VIVELLE-DOT) 0.0375 MG/24HR Place 1 patch onto the skin 2 (two) times a week.        . eszopiclone (LUNESTA) 2 MG TABS Take 2 mg by mouth at bedtime as needed. Take immediately before bedtime      . levothyroxine (SYNTHROID, LEVOTHROID) 100 MCG tablet Take 1 tablet (100 mcg total) by mouth daily.  90 tablet  0  . progesterone (PROMETRIUM) 200 MG capsule Take 200 mg by mouth. Use as directed         BP 100/80  Temp  97.6 F (36.4 C) (Oral)  Wt 191 lb (86.637 kg)  SpO2 96%      Review of Systems  Constitutional: Negative.   HENT: Positive for hearing loss and ear pain (Fullness). Negative for congestion, sore throat, rhinorrhea, dental problem, sinus pressure and tinnitus.        Weber did not lateralize The left tympanic membrane showed some mild hyperemic vessels superiorly Auditory acuity mildly diminished on the left  Eyes: Negative for pain, discharge and visual disturbance.  Respiratory: Negative for cough and shortness of breath.   Cardiovascular: Negative for chest pain, palpitations and leg swelling.  Gastrointestinal: Negative for nausea, vomiting, abdominal pain, diarrhea, constipation, blood in stool and abdominal distention.  Genitourinary: Negative for dysuria, urgency, frequency, hematuria, flank pain, vaginal bleeding, vaginal discharge, difficulty urinating, vaginal pain and pelvic pain.  Musculoskeletal: Negative for joint swelling, arthralgias and gait problem.  Skin: Negative for rash.    Neurological: Negative for dizziness, syncope, speech difficulty, weakness, numbness and headaches.  Hematological: Negative for adenopathy.  Psychiatric/Behavioral: Negative for behavioral problems, dysphoric mood and agitation. The patient is not nervous/anxious.        Objective:   Physical Exam  Constitutional: She is oriented to person, place, and time. She appears well-developed and well-nourished.  HENT:  Head: Normocephalic.  Right Ear: External ear normal.  Left Ear: External ear normal.  Mouth/Throat: Oropharynx is clear and moist.       The left tympanic membrane revealed some very mild hyperemic vessels superiorly Hearing is impaired slightly on the left Weber did not lateralized    Eyes: Conjunctivae and EOM are normal. Pupils are equal, round, and reactive to light.  Neck: Normal range of motion. Neck supple. No thyromegaly present.  Cardiovascular: Normal rate, regular rhythm, normal heart sounds and intact distal pulses.   Pulmonary/Chest: Effort normal and breath sounds normal. No respiratory distress. She has no wheezes. She has no rales. She exhibits no tenderness.  Abdominal: Soft. Bowel sounds are normal. She exhibits no mass. There is no tenderness.  Musculoskeletal: Normal range of motion.  Lymphadenopathy:    She has no cervical adenopathy.  Neurological: She is alert and oriented to person, place, and time.  Skin: Skin is warm and dry. No rash noted.  Psychiatric: She has a normal mood and affect. Her behavior is normal.          Assessment & Plan:   Slowly resolving URI. Pharyngitis resolved probable mild serous otitis. Will treat with Nasonex decongestants saline irrigation Dyslipidemia stable

## 2012-04-23 NOTE — Patient Instructions (Signed)
Nasonex use daily   Use saline irrigation, warm  moist compresses and over-the-counter decongestants only as directed.  Call if there is no improvement in 5 to 7 days, or sooner if you develop increasing pain, fever, or any new symptoms.  Call or return to clinic prn if these symptoms worsen or fail to improve as anticipated.

## 2012-05-01 ENCOUNTER — Encounter: Payer: Medicare Other | Admitting: Internal Medicine

## 2012-05-09 ENCOUNTER — Encounter: Payer: Self-pay | Admitting: Gastroenterology

## 2012-05-16 LAB — HM MAMMOGRAPHY: HM Mammogram: NEGATIVE

## 2012-05-19 ENCOUNTER — Encounter: Payer: Self-pay | Admitting: Internal Medicine

## 2012-05-19 ENCOUNTER — Ambulatory Visit (INDEPENDENT_AMBULATORY_CARE_PROVIDER_SITE_OTHER): Payer: Medicare Other | Admitting: Internal Medicine

## 2012-05-19 VITALS — BP 120/78 | HR 72 | Resp 20 | Ht 62.0 in | Wt 194.0 lb

## 2012-05-19 DIAGNOSIS — Z860100 Personal history of colon polyps, unspecified: Secondary | ICD-10-CM

## 2012-05-19 DIAGNOSIS — E039 Hypothyroidism, unspecified: Secondary | ICD-10-CM

## 2012-05-19 DIAGNOSIS — E785 Hyperlipidemia, unspecified: Secondary | ICD-10-CM

## 2012-05-19 DIAGNOSIS — G4733 Obstructive sleep apnea (adult) (pediatric): Secondary | ICD-10-CM

## 2012-05-19 DIAGNOSIS — Z0000 Encounter for general adult medical examination without abnormal findings: Secondary | ICD-10-CM

## 2012-05-19 DIAGNOSIS — N951 Menopausal and female climacteric states: Secondary | ICD-10-CM

## 2012-05-19 LAB — LIPID PANEL
HDL: 46.1 mg/dL (ref >39.00)
Triglycerides: 113 mg/dL (ref 0.0–149.0)

## 2012-05-19 LAB — COMPREHENSIVE METABOLIC PANEL
CO2: 23 mEq/L (ref 19–32)
Calcium: 8.8 mg/dL (ref 8.4–10.5)
Chloride: 111 mEq/L (ref 96–112)
Creatinine, Ser: 0.7 mg/dL (ref 0.4–1.2)
GFR: 88.07 mL/min (ref >60.00)
Glucose, Bld: 91 mg/dL (ref 70–99)
Total Bilirubin: 0.4 mg/dL (ref 0.3–1.2)
Total Protein: 6.5 g/dL (ref 6.0–8.3)

## 2012-05-19 LAB — CBC WITH DIFFERENTIAL/PLATELET
Basophils Absolute: 0 10*3/uL (ref 0.0–0.1)
Eosinophils Relative: 0.7 % (ref 0.0–5.0)
HCT: 36.1 % (ref 36.0–46.0)
Lymphocytes Relative: 28.6 % (ref 12.0–46.0)
Lymphs Abs: 2 10*3/uL (ref 0.7–4.0)
Monocytes Relative: 8.9 % (ref 3.0–12.0)
Platelets: 229 10*3/uL (ref 150.0–400.0)
WBC: 7.1 10*3/uL (ref 4.5–10.5)

## 2012-05-19 LAB — TSH: TSH: 4.28 u[IU]/mL (ref 0.35–5.50)

## 2012-05-19 MED ORDER — ATORVASTATIN CALCIUM 10 MG PO TABS: 10.0000 mg | ORAL_TABLET | Freq: Every day | ORAL | Status: DC

## 2012-05-19 MED ORDER — LEVOTHYROXINE SODIUM 100 MCG PO TABS: 100.0000 ug | ORAL_TABLET | Freq: Every day | ORAL | Status: DC

## 2012-05-19 NOTE — Patient Instructions (Addendum)
Limit your sodium (Salt) intake  You need to lose weight.  Consider a lower calorie diet and regular exercise.    It is important that you exercise regularly, at least 20 minutes 3 to 4 times per week.  If you develop chest pain or shortness of breath seek  medical attention.  Return in one year for follow-up   Schedule your colonoscopy to help detect colon cancer. 

## 2012-05-19 NOTE — Progress Notes (Signed)
Patient ID: Emily Castaneda, female   DOB: 11-25-1934, 77 y.o.   MRN: [id number]  Subjective:    Patient ID: Emily Castaneda, female    DOB: [birth year], 77 y.o.   MRN: [id number]  Hyperlipidemia Pertinent negatives include no chest pain or shortness of breath.   CC: cpx - doing well .   History of Present Illness:   77 year-old patient who is seen today for a comprehensive evaluation. She has a history of depression and is followed by psychiatry. She is followed by Dr. Nicholas Lose for her gynecologic care. She has mild dyslipidemia, hypothyroidism, and a history of colonic polyps. She remains on Lipitor 10 mg daily.  Last colonoscopy was 2007.  Here for Medicare AWV:   1. Risk factors based on Past M, S, F history: personal risk factors include dyslipidemia. She has a family history of coronary artery disease and a personal history of colonic polyps.  2. Physical Activities: no activity restrictions, fairly sedentary  3. Depression/mood: history of bipolar depression, which has been stable followed by psychiatry  4. Hearing: no hearing deficits  5. ADL's: completely independent in all aspects of daily living  6. Fall Risk: low  7. Home Safety: no problems identified  8. Height, weight, &visual acuity:no change in high at or weight. Visual acuity is normal. Does have annual eye examinations  9. Counseling: heart healthy diet regular. Exercise and modest weight loss. All encouraged  10. Labs ordered based on risk factors: complete laboratory profile, including lipid panel will be reviewed  11. Referral Coordination- mammogram this fall  12. Care Plan- heart healthy diet calorie restricted diet and exercise regimen. All encouraged  13. Cognitive Assessment- alert and oriented, with normal affect   Allergies (verified):  No Known Drug Allergies   Past History:  Past Medical History:  Colonic polyps, hx of  Depression ( Dr Raquel James)  Diverticulosis, colon  Hyperlipidemia  Hypothyroidism    menopausal syndrome  OSA (CPAP) 17 cm (Clint Young)   Past Surgical History:  Reviewed history from 02/26/2008 and no changes required.  Appendectomy 2002  Thyroidectomy  colonoscopy August 2007   Family History:  Reviewed history from 02/26/2008 and no changes required.  father died att 40, complications of coronary artery disease  mother died at 73-history of diabetes  one brother, status post CABG  Family History of CAD Female 1st degree relative <50  family history also positive for breast and lung cancer   Social History:  Reviewed history from 02/27/2007 and no changes required.  married two daughters, 4 grandchildren    Review of Systems  Constitutional: Negative for fever, appetite change, fatigue and unexpected weight change.  HENT: Negative for hearing loss, ear pain, nosebleeds, congestion, sore throat, mouth sores, trouble swallowing, neck stiffness, dental problem, voice change, sinus pressure and tinnitus.   Eyes: Negative for photophobia, pain, redness and visual disturbance.  Respiratory: Negative for cough, chest tightness and shortness of breath.   Cardiovascular: Negative for chest pain, palpitations and leg swelling.  Gastrointestinal: Negative for nausea, vomiting, abdominal pain, diarrhea, constipation, blood in stool, abdominal distention and rectal pain.  Genitourinary: Negative for dysuria, urgency, frequency, hematuria, flank pain, vaginal bleeding, vaginal discharge, difficulty urinating, genital sores, vaginal pain, menstrual problem and pelvic pain.  Musculoskeletal: Negative for back pain and arthralgias.  Skin: Negative for rash.  Neurological: Negative for dizziness, syncope, speech difficulty, weakness, light-headedness, numbness and headaches.  Hematological: Negative for adenopathy. Does not bruise/bleed easily.  Psychiatric/Behavioral: Negative for suicidal ideas,  behavioral problems, self-injury, dysphoric mood and agitation. The patient is not  nervous/anxious.        Objective:   Physical Exam  Constitutional: She is oriented to person, place, and time. She appears well-developed and well-nourished.  HENT:  Head: Normocephalic and atraumatic.  Right Ear: External ear normal.  Left Ear: External ear normal.  Mouth/Throat: Oropharynx is clear and moist.  Eyes: Conjunctivae normal and EOM are normal.  Neck: Normal range of motion. Neck supple. No JVD present. No thyromegaly present.  Cardiovascular: Normal rate, regular rhythm, normal heart sounds and intact distal pulses.   No murmur heard.      Right dorsalis pedis pulse diminished  Pulmonary/Chest: Effort normal and breath sounds normal. She has no wheezes. She has no rales.  Abdominal: Soft. Bowel sounds are normal. She exhibits no distension and no mass. There is no tenderness. There is no rebound and no guarding.       Obese Lower midline scar  Genitourinary: Vagina normal.  Musculoskeletal: Normal range of motion. She exhibits no edema and no tenderness.  Neurological: She is alert and oriented to person, place, and time. She has normal reflexes. No cranial nerve deficit. She exhibits normal muscle tone. Coordination normal.  Skin: Skin is warm and dry. No rash noted.  Psychiatric: She has a normal mood and affect. Her behavior is normal.          Assessment & Plan:   Preventive health examination Hypothyroidism. We'll check a TSH Hyperlipidemia. A lipid profile be reviewed Exogenous obesity. Weight loss exercise encouraged History colonic polyps. Followup colonoscopy recommended. She has received a letter to schedule followup History depression. Followup psychiatry Menopausal syndrome. A recent mammogram has been performed last week she does receive annual gynecologic care OSA on CPAP. Stable   Recheck 1 year or as needed All medications refilled

## 2012-06-18 ENCOUNTER — Encounter: Payer: Self-pay | Admitting: Gastroenterology

## 2012-07-22 ENCOUNTER — Ambulatory Visit (AMBULATORY_SURGERY_CENTER): Payer: Medicare Other

## 2012-07-22 VITALS — Ht 63.0 in | Wt 198.6 lb

## 2012-07-22 DIAGNOSIS — Z86010 Personal history of colonic polyps: Secondary | ICD-10-CM

## 2012-07-22 DIAGNOSIS — Z1211 Encounter for screening for malignant neoplasm of colon: Secondary | ICD-10-CM

## 2012-07-22 MED ORDER — MOVIPREP 100 G PO SOLR: ORAL | Status: DC

## 2012-08-01 ENCOUNTER — Encounter: Payer: Self-pay | Admitting: Gastroenterology

## 2012-08-01 ENCOUNTER — Encounter: Payer: Medicare Other | Admitting: Gastroenterology

## 2012-08-01 ENCOUNTER — Ambulatory Visit (AMBULATORY_SURGERY_CENTER): Payer: Medicare Other | Admitting: Gastroenterology

## 2012-08-01 VITALS — BP 112/60 | HR 71 | Temp 97.8°F | Resp 17 | Ht 63.0 in | Wt 198.0 lb

## 2012-08-01 DIAGNOSIS — K5730 Diverticulosis of large intestine without perforation or abscess without bleeding: Secondary | ICD-10-CM

## 2012-08-01 DIAGNOSIS — D126 Benign neoplasm of colon, unspecified: Secondary | ICD-10-CM

## 2012-08-01 DIAGNOSIS — Z86010 Personal history of colonic polyps: Secondary | ICD-10-CM

## 2012-08-01 DIAGNOSIS — Z1211 Encounter for screening for malignant neoplasm of colon: Secondary | ICD-10-CM

## 2012-08-01 MED ORDER — SODIUM CHLORIDE 0.9 % IV SOLN: 500.0000 mL | INTRAVENOUS | Status: DC

## 2012-08-01 NOTE — Patient Instructions (Addendum)
YOU HAD AN ENDOSCOPIC PROCEDURE TODAY AT THE [facility]: Refer to the procedure report that was given to you for any specific questions about what was found during the examination.  If the procedure report does not answer your questions, please call your gastroenterologist to clarify.  If you requested that your care partner not be given the details of your procedure findings, then the procedure report has been included in a sealed envelope for you to review at your convenience later.  YOU SHOULD EXPECT: Some feelings of bloating in the abdomen. Passage of more gas than usual.  Walking can help get rid of the air that was put into your GI tract during the procedure and reduce the bloating. If you had a lower endoscopy (such as a colonoscopy or flexible sigmoidoscopy) you may notice spotting of blood in your stool or on the toilet paper. If you underwent a bowel prep for your procedure, then you may not have a normal bowel movement for a few days.  DIET: Your first meal following the procedure should be a light meal and then it is ok to progress to your normal diet.  A half-sandwich or bowl of soup is an example of a good first meal.  Heavy or fried foods are harder to digest and may make you feel nauseous or bloated.  Likewise meals heavy in dairy and vegetables can cause extra gas to form and this can also increase the bloating.  Drink plenty of fluids but you should avoid alcoholic beverages for 24 hours.  ACTIVITY: Your care partner should take you home directly after the procedure.  You should plan to take it easy, moving slowly for the rest of the day.  You can resume normal activity the day after the procedure however you should NOT DRIVE or use heavy machinery for 24 hours (because of the sedation medicines used during the test).    SYMPTOMS TO REPORT IMMEDIATELY: A gastroenterologist can be reached at any hour.  During normal business hours, 8:30 AM to 5:00 PM Monday through Friday,  call [phone].  After hours and on weekends, please call the GI answering service at [phone] who will take a message and have the physician on call contact you.   Following lower endoscopy (colonoscopy or flexible sigmoidoscopy):  Excessive amounts of blood in the stool  Significant tenderness or worsening of abdominal pains  Swelling of the abdomen that is new, acute  Fever of 100F or higher  FOLLOW UP: If any biopsies were taken you will be contacted by phone or by letter within the next 1-3 weeks.  Call your gastroenterologist if you have not heard about the biopsies in 3 weeks.  Our staff will call the home number listed on your records the next business day following your procedure to check on you and address any questions or concerns that you may have at that time regarding the information given to you following your procedure. This is a courtesy call and so if there is no answer at the home number and we have not heard from you through the emergency physician on call, we will assume that you have returned to your regular daily activities without incident.  SIGNATURES/CONFIDENTIALITY: You and/or your care partner have signed paperwork which will be entered into your electronic medical record.  These signatures attest to the fact that that the information above on your After Visit Summary has been reviewed and is understood.  Full responsibility of the confidentiality of this   discharge information lies with you and/or your care-partner.   Diverticulosis-handout given  High Fiber Diet-handout given  Repeat colonoscopy in 5 years  

## 2012-08-01 NOTE — Progress Notes (Signed)
Patient did not experience any of the following events: a burn prior to discharge; a fall within the facility; wrong site/side/patient/procedure/implant event; or a hospital transfer or hospital admission upon discharge from the facility. (G8907) Patient did not have preoperative order for IV antibiotic SSI prophylaxis. (G8918)  

## 2012-08-01 NOTE — Op Note (Signed)
[facility] [address] [place], [address]   COLONOSCOPY PROCEDURE REPORT  PATIENT: Emily, Castaneda  MR#: [id number] BIRTHDATE: 04-27-1935 , 77  yrs. old GENDER: Female ENDOSCOPIST: Mardella Layman, MD, [facility] REFERRED BY: PROCEDURE DATE:  08/01/2012 PROCEDURE:   Colonoscopy with snare polypectomy ASA CLASS:   Class II INDICATIONS:Patient's personal history of adenomatous colon polyps.  MEDICATIONS: propofol (Diprivan) 200mg  IV  DESCRIPTION OF PROCEDURE:   After the risks and benefits and of the procedure were explained, informed consent was obtained.  A digital rectal exam revealed no abnormalities of the rectum.    The LB [id number]  endoscope was introduced through the anus and advanced to the cecum, which was identified by both the appendix and ileocecal valve .  The quality of the prep was good, using MoviPrep .  The instrument was then slowly withdrawn as the colon was fully examined.     COLON FINDINGS: The colon was redundant.  Manual abdominal counter-pressure was used to reach the cecum.   There was moderate diverticulosis noted in the descending colon and sigmoid colon with associated muscular hypertrophy.     Retroflexed views revealed no abnormalities.  There is a very redundant and tortuous colon. in the right colon there was a flat 1 cm polyp with a mucous catheter and was removed with the hot snare technique. A smaller right colon polyp was removed with the cold snare.    The scope was then withdrawn from the patient and the procedure completed.  COMPLICATIONS: There were no complications. ENDOSCOPIC IMPRESSION: 1.   The colon was redundant ,difficult exam. 2.   There was moderate diverticulosis noted in the descending colon and sigmoid colon 3. Right colon serrated adenomas RECOMMENDATIONS: 1.  Await biopsy results 2.  High fiber diet 3.  Repeat colonoscopy in 5 years if polyp adenomatous; otherwise 10 years   REPEAT  EXAM:  GE:XBMWU Lysle Dingwall, MD  _______________________________ eSigned:  Mardella Layman, MD, [facility] 08/01/2012 2:42 PM     PATIENT NAME:  Emily, Castaneda MR#: [id number]

## 2012-08-04 ENCOUNTER — Telehealth: Payer: Self-pay | Admitting: *Deleted

## 2012-08-04 NOTE — Telephone Encounter (Signed)
  Follow up Call-  Call back number 08/01/2012  Post procedure Call Back phone  # [phone]  Permission to leave phone message Yes     Patient questions:  Do you have a fever, pain , or abdominal swelling? no Pain Score  0 *  Have you tolerated food without any problems? yes  Have you been able to return to your normal activities? yes  Do you have any questions about your discharge instructions: Diet   no Medications  no Follow up visit  no  Do you have questions or concerns about your Care? no  Actions: * If pain score is 4 or above: No action needed, pain <4.

## 2012-08-08 ENCOUNTER — Encounter: Payer: Self-pay | Admitting: Gastroenterology

## 2012-08-10 ENCOUNTER — Other Ambulatory Visit: Payer: Self-pay | Admitting: Internal Medicine

## 2012-12-18 ENCOUNTER — Encounter: Payer: Self-pay | Admitting: Internal Medicine

## 2012-12-18 ENCOUNTER — Ambulatory Visit (INDEPENDENT_AMBULATORY_CARE_PROVIDER_SITE_OTHER): Payer: Medicare Other | Admitting: Internal Medicine

## 2012-12-18 VITALS — BP 120/80 | HR 99 | Temp 98.0°F | Resp 20 | Wt 200.0 lb

## 2012-12-18 DIAGNOSIS — F3289 Other specified depressive episodes: Secondary | ICD-10-CM

## 2012-12-18 DIAGNOSIS — G4733 Obstructive sleep apnea (adult) (pediatric): Secondary | ICD-10-CM

## 2012-12-18 DIAGNOSIS — E785 Hyperlipidemia, unspecified: Secondary | ICD-10-CM

## 2012-12-18 DIAGNOSIS — Z9989 Dependence on other enabling machines and devices: Secondary | ICD-10-CM

## 2012-12-18 DIAGNOSIS — F329 Major depressive disorder, single episode, unspecified: Secondary | ICD-10-CM

## 2012-12-18 DIAGNOSIS — E039 Hypothyroidism, unspecified: Secondary | ICD-10-CM

## 2012-12-18 NOTE — Progress Notes (Signed)
Subjective:    Patient ID: Emily Castaneda, female    DOB: 1935/03/29, 78 y.o.   MRN: [id number]  HPI  78 year old patient who is seen today at the urging of her husband do to mainly decreased exercise capacity. She was on trips recently to [place] and also the [place] and had a difficult time keeping up with the her party. She states she does have some left hip pain and some difficulty going downstairs but not going upstairs;  her weight is 200 pounds and she is not very active. She is planning on starting water aerobics next month. On a trip to [place] she forgot her thyroid supplementation. She has been compliant with the medication at this time  Past Medical History  Diagnosis Date  . Hx of colonic polyps   . Depression     Dr. Raquel James  . Diverticulosis of colon   . Hyperlipidemia   . Hypothyroidism   . Menopausal syndrome   . OSA (obstructive sleep apnea)     cpap; 17 cm (clint young)  . Endometriosis   . Manic, depressive     History   Social History  . Marital Status: Married    Spouse Name: N/A    Number of Children: N/A  . Years of Education: N/A   Occupational History  . Not on file.   Social History Main Topics  . Smoking status: Former Smoker    Types: Cigarettes    Quit date: 08/21/1983  . Smokeless tobacco: Never Used     Comment: only smoked socially  . Alcohol Use: .5 - 1 oz/week    1-2 drink(s) per week     Comment: occ glass of wine   . Drug Use: No  . Sexually Active: Not on file   Other Topics Concern  . Not on file   Social History Narrative  . No narrative on file    Past Surgical History  Procedure Laterality Date  . Polypectomy      Dr. Nicholas Lose  . Colonoscopy    . Appendectomy    . Tonsillectomy and adenoidectomy      Family History  Problem Relation Age of Onset  . Diabetes Mother   . Heart disease Father     complications of coronary heart disease  . Heart disease Other   . Cancer Other     BREAST AND LUNG   . Diabetes Brother    . Colon cancer Neg Hx   . Rectal cancer Neg Hx   . Stomach cancer Neg Hx     No Known Allergies  Current Outpatient Prescriptions on File Prior to Visit  Medication Sig Dispense Refill  . atorvastatin (LIPITOR) 10 MG tablet Take 1 tablet (10 mg total) by mouth daily.  90 tablet  3  . Cholecalciferol (VITAMIN D3) 1000 UNITS CAPS Take 1 capsule by mouth daily.      . clonazePAM (KLONOPIN) 1 MG tablet Take 1 mg by mouth at bedtime.       . divalproex (DEPAKOTE) 250 MG EC tablet Take 250 mg by mouth. 5 tablets at bedtime      . eszopiclone (LUNESTA) 2 MG TABS Take 2 mg by mouth at bedtime as needed. Take immediately before bedtime      . levothyroxine (SYNTHROID, LEVOTHROID) 100 MCG tablet TAKE 1 TABLET BY MOUTH DAILY  90 tablet  0   No current facility-administered medications on file prior to visit.    BP 120/80  Pulse 99  Temp(Src)  98 F (36.7 C) (Oral)  Resp 20  Wt 200 lb (90.719 kg)  BMI 35.44 kg/m2  SpO2 96%       Review of Systems  Constitutional: Negative.   HENT: Negative for hearing loss, congestion, sore throat, rhinorrhea, dental problem, sinus pressure and tinnitus.   Eyes: Negative for pain, discharge and visual disturbance.  Respiratory: Negative for cough and shortness of breath.   Cardiovascular: Negative for chest pain, palpitations and leg swelling.  Gastrointestinal: Negative for nausea, vomiting, abdominal pain, diarrhea, constipation, blood in stool and abdominal distention.  Genitourinary: Negative for dysuria, urgency, frequency, hematuria, flank pain, vaginal bleeding, vaginal discharge, difficulty urinating, vaginal pain and pelvic pain.  Musculoskeletal: Positive for gait problem. Negative for joint swelling and arthralgias.  Skin: Negative for rash.  Neurological: Negative for dizziness, syncope, speech difficulty, weakness, numbness and headaches.  Hematological: Negative for adenopathy.  Psychiatric/Behavioral: Negative for behavioral problems,  dysphoric mood and agitation. The patient is not nervous/anxious.        Objective:   Physical Exam  Constitutional: She is oriented to person, place, and time. She appears well-developed and well-nourished.  HENT:  Head: Normocephalic.  Right Ear: External ear normal.  Left Ear: External ear normal.  Mouth/Throat: Oropharynx is clear and moist.  Eyes: Conjunctivae and EOM are normal. Pupils are equal, round, and reactive to light.  Neck: Normal range of motion. Neck supple. No thyromegaly present.  Cardiovascular: Normal rate, regular rhythm, normal heart sounds and intact distal pulses.   Pulmonary/Chest: Effort normal and breath sounds normal.  Abdominal: Soft. Bowel sounds are normal. She exhibits no mass. There is no tenderness.  Musculoskeletal: Normal range of motion.  Lymphadenopathy:    She has no cervical adenopathy.  Neurological: She is alert and oriented to person, place, and time.  Skin: Skin is warm and dry. No rash noted.  Psychiatric: She has a normal mood and affect. Her behavior is normal.          Assessment & Plan:   Exercise intolerance. Probably multifactorial with deconditioning weight and arthritis are contributing factors. The patient also was off thyroid supplementation during one of her prolonged trips. Exam is unremarkable. Laboratory studies from the fall reviewed  History depression. Followup psychiatry  Seen the fall for her annual physical

## 2012-12-18 NOTE — Patient Instructions (Signed)
It is important that you exercise regularly, at least 20 minutes 3 to 4 times per week.  If you develop chest pain or shortness of breath seek  medical attention.  You need to lose weight.  Consider a lower calorie diet and regular exercise.  Return in 6 months for follow-up  

## 2012-12-18 NOTE — Progress Notes (Signed)
  Subjective:    Patient ID: Emily Castaneda, female    DOB: Jan 05, 1935, 78 y.o.   MRN: [id number]  HPI  Wt Readings from Last 3 Encounters:  12/18/12 200 lb (90.719 kg)  08/01/12 198 lb (89.812 kg)  07/22/12 198 lb 9.6 oz (90.084 kg)    Review of Systems     Objective:   Physical Exam        Assessment & Plan:

## 2013-01-07 ENCOUNTER — Other Ambulatory Visit (HOSPITAL_COMMUNITY)
Admission: RE | Admit: 2013-01-07 | Discharge: 2013-01-07 | Disposition: A | Discharge status: Home / Self Care | Payer: Medicare Other | Source: Ambulatory Visit | Attending: Gynecology | Admitting: Gynecology

## 2013-01-07 ENCOUNTER — Ambulatory Visit (INDEPENDENT_AMBULATORY_CARE_PROVIDER_SITE_OTHER): Payer: Medicare Other | Admitting: Gynecology

## 2013-01-07 ENCOUNTER — Encounter: Payer: Self-pay | Admitting: Gynecology

## 2013-01-07 VITALS — BP 130/80 | Ht 62.0 in | Wt 199.0 lb

## 2013-01-07 DIAGNOSIS — F319 Bipolar disorder, unspecified: Secondary | ICD-10-CM

## 2013-01-07 DIAGNOSIS — Z780 Asymptomatic menopausal state: Secondary | ICD-10-CM

## 2013-01-07 DIAGNOSIS — R5381 Other malaise: Secondary | ICD-10-CM

## 2013-01-07 DIAGNOSIS — Z1151 Encounter for screening for human papillomavirus (HPV): Secondary | ICD-10-CM | POA: Insufficient documentation

## 2013-01-07 DIAGNOSIS — N898 Other specified noninflammatory disorders of vagina: Secondary | ICD-10-CM

## 2013-01-07 DIAGNOSIS — Z124 Encounter for screening for malignant neoplasm of cervix: Secondary | ICD-10-CM

## 2013-01-07 DIAGNOSIS — Z01419 Encounter for gynecological examination (general) (routine) without abnormal findings: Secondary | ICD-10-CM | POA: Insufficient documentation

## 2013-01-07 DIAGNOSIS — R5383 Other fatigue: Secondary | ICD-10-CM

## 2013-01-07 LAB — WET PREP FOR TRICH, YEAST, CLUE: Yeast Wet Prep HPF POC: NONE SEEN

## 2013-01-07 NOTE — Patient Instructions (Signed)
Shingles Vaccine What You Need to Know WHAT IS SHINGLES?  Shingles is a painful skin rash, often with blisters. It is also called Herpes Zoster or just Zoster.  A shingles rash usually appears on one side of the face or body and lasts from 2 to 4 weeks. Its main symptom is pain, which can be quite severe. Other symptoms of shingles can include fever, headache, chills, and upset stomach. Very rarely, a shingles infection can lead to pneumonia, hearing problems, blindness, brain inflammation (encephalitis), or death.  For about 1 person in 5, severe pain can continue even after the rash clears up. This is called post-herpetic neuralgia.  Shingles is caused by the Varicella Zoster virus. This is the same virus that causes chickenpox. Only someone who has had a case of chickenpox or rarely, has gotten chickenpox vaccine, can get shingles. The virus stays in your body. It can reappear many years later to cause a case of shingles.  You cannot catch shingles from another person with shingles. However, a person who has never had chickenpox (or chickenpox vaccine) could get chickenpox from someone with shingles. This is not very common.  Shingles is far more common in people 50 and older than in younger people. It is also more common in people whose immune systems are weakened because of a disease such as cancer or drugs such as steroids or chemotherapy.  At least 1 million people get shingles per year in the [place]. SHINGLES VACCINE  A vaccine for shingles was licensed in 2006. In clinical trials, the vaccine reduced the risk of shingles by 50%. It can also reduce the pain in people who still get shingles after being vaccinated.  A single dose of shingles vaccine is recommended for adults 60 years of age and older. SOME PEOPLE SHOULD NOT GET SHINGLES VACCINE OR SHOULD WAIT A person should not get shingles vaccine if he or she:  Has ever had a life-threatening allergic reaction to gelatin, the  antibiotic neomycin, or any other component of shingles vaccine. Tell your caregiver if you have any severe allergies.  Has a weakened immune system because of current:  AIDS or another disease that affects the immune system.  Treatment with drugs that affect the immune system, such as prolonged use of high-dose steroids.  Cancer treatment, such as radiation or chemotherapy.  Cancer affecting the bone marrow or lymphatic system, such as leukemia or lymphoma.  Is pregnant, or might be pregnant. Women should not become pregnant until at least 4 weeks after getting shingles vaccine. Someone with a minor illness, such as a cold, may be vaccinated. Anyone with a moderate or severe acute illness should usually wait until he or she recovers before getting the vaccine. This includes anyone with a temperature of 101.3 F (38 C) or higher. WHAT ARE THE RISKS FROM SHINGLES VACCINE?  A vaccine, like any medicine, could possibly cause serious problems, such as severe allergic reactions. However, the risk of a vaccine causing serious harm, or death, is extremely small.  No serious problems have been identified with shingles vaccine. Mild Problems  Redness, soreness, swelling, or itching at the site of the injection (about 1 person in 3).  Headache (about 1 person in 70). Like all vaccines, shingles vaccine is being closely monitored for unusual or severe problems. WHAT IF THERE IS A MODERATE OR SEVERE REACTION? What should I look for? Any unusual condition, such as a severe allergic reaction or a high fever. If a severe allergic reaction   occurred, it would be within a few minutes to an hour after the shot. Signs of a serious allergic reaction can include difficulty breathing, weakness, hoarseness or wheezing, a fast heartbeat, hives, dizziness, paleness, or swelling of the throat. What should I do?  Call your caregiver, or get the person to a caregiver right away.  Tell the caregiver what  happened, the date and time it happened, and when the vaccination was given.  Ask the caregiver to report the reaction by filing a Vaccine Adverse Event Reporting System (VAERS) form. Or, you can file this report through the VAERS web site at www.vaers.hhs.gov or by calling 1-800-822-7967. VAERS does not provide medical advice. HOW CAN I LEARN MORE?  Ask your caregiver. He or she can give you the vaccine package insert or suggest other sources of information.  Contact the Centers for Disease Control and Prevention (CDC):  Call 1-800-232-4636 (1-800-CDC-INFO).  Visit the CDC website at www.cdc.gov/vaccines CDC Shingles Vaccine VIS (05/25/08) Document Released: 06/03/2006 Document Revised: 10/29/2011 Document Reviewed: 05/25/2008 ExitCare Patient Information 2014 [employer].  

## 2013-01-07 NOTE — Progress Notes (Signed)
Emily Castaneda 1935-01-06 [id number]   History:    78 y.o. who is new to the practice and was a former patient of Dr. Beather Arbour presented to the office complaining of a vaginal discharge. Her gynecological history after reviewing her records was as follows:  resectoscopic polypectomy 1999 benign endometrial polyp no hyperplasia or malignancy identified  Resectoscopic polypectomy also in 1989  CIN-1 treated with LEEP excision several years ago  Endometrial biopsy benign February 2012 for postmenopausal bleeding  Patient has history of bipolar disorder. Her primary physician is Dr. Reeves Dam who has been treating her for dyslipidemia. Patient had her T. Vaccine and her Pneumovax vaccine but has not had her shingles vaccine. Patient has had normal Pap smear for the past 3 years and her mammogram was normal in 2013.   Past medical history,surgical history, family history and social history were all reviewed and documented in the EPIC chart.  Gynecologic History No LMP recorded. Patient is postmenopausal. Contraception: post menopausal status Last Pap: 2013. Results were: normal Last mammogram: 2013. Results were: normal  Obstetric History OB History   Grav Para Term Preterm Abortions TAB SAB Ect Mult Living   3 3        3      # Outc Date GA Lbr Len/2nd Wgt Sex Del Anes PTL Lv   1 PAR            2 PAR            3 PAR                ROS: A ROS was performed and pertinent positives and negatives are included in the history.  GENERAL: No fevers or chills. HEENT: No change in vision, no earache, sore throat or sinus congestion. NECK: No pain or stiffness. CARDIOVASCULAR: No chest pain or pressure. No palpitations. PULMONARY: No shortness of breath, cough or wheeze. GASTROINTESTINAL: No abdominal pain, nausea, vomiting or diarrhea, melena or bright red blood per rectum. GENITOURINARY: No urinary frequency, urgency, hesitancy or dysuria. MUSCULOSKELETAL: No joint or muscle pain,  no back pain, no recent trauma. DERMATOLOGIC: No rash, no itching, no lesions. ENDOCRINE: No polyuria, polydipsia, no heat or cold intolerance. No recent change in weight. HEMATOLOGICAL: No anemia or easy bruising or bleeding. NEUROLOGIC: No headache, seizures, numbness, tingling or weakness. PSYCHIATRIC: No depression, no loss of interest in normal activity or change in sleep pattern.     Exam: chaperone present  BP 130/80  Ht 5\' 2"  (1.575 m)  Wt 199 lb (90.266 kg)  BMI 36.39 kg/m2  Body mass index is 36.39 kg/(m^2).  General appearance : Well developed well nourished female. No acute distress HEENT: Neck supple, trachea midline, no carotid bruits, no thyroidmegaly Lungs: Clear to auscultation, no rhonchi or wheezes, or rib retractions  Heart: Regular rate and rhythm, no murmurs or gallops Breast:Examined in sitting and supine position were symmetrical in appearance, no palpable masses or tenderness,  no skin retraction, no nipple inversion, no nipple discharge, no skin discoloration, no axillary or supraclavicular lymphadenopathy Abdomen: no palpable masses or tenderness, no rebound or guarding Extremities: no edema or skin discoloration or tenderness  Pelvic:  Bartholin, Urethra, Skene Glands: Within normal limits             Vagina: No gross lesions or discharge  Cervix: No gross lesions or discharge  Uterus  axial, normal size, shape and consistency, non-tender and mobile  Adnexa  Without masses or tenderness  Anus and perineum  normal   Rectovaginal  normal sphincter tone without palpated masses or tenderness             Hemoccult card provided   Wet prep negative  Assessment/Plan:  78 y.o. postmenopausal patient who does not recall when her last bone density study was done but she has informed me that it was normal over 5 years ago. She will schedule a bone density study here in our office the next 2 weeks. We did do a Pap smear today because of her history of dysplasia in the  past. We did discuss the new guidelines. She was given a prescription for shingles vaccine. Hemoccult cards were provided her to submit to the office for testing. We discussed importance of calcium and vitamin D and regular exercise for osteoporosis prevention. She was reminded to do her monthly self breast examination. She will need a followup colonoscopy in 2019. She will schedule her mammogram for September of this year.   Ok Edwards MD, 9:49 AM 01/07/2013

## 2013-03-03 ENCOUNTER — Ambulatory Visit (INDEPENDENT_AMBULATORY_CARE_PROVIDER_SITE_OTHER): Payer: Medicare Other

## 2013-03-03 DIAGNOSIS — Z780 Asymptomatic menopausal state: Secondary | ICD-10-CM

## 2013-05-19 ENCOUNTER — Encounter: Payer: Self-pay | Admitting: Internal Medicine

## 2013-05-19 LAB — HM MAMMOGRAPHY: HM Mammogram: NEGATIVE

## 2013-05-21 ENCOUNTER — Encounter: Payer: Self-pay | Admitting: Gynecology

## 2013-05-31 ENCOUNTER — Other Ambulatory Visit: Payer: Self-pay | Admitting: Internal Medicine

## 2013-06-01 NOTE — Telephone Encounter (Signed)
Pt husband called and stated that they where leaving the state, early tomorrow morning, and they would like to receive this refill asap. Please assist.

## 2013-08-14 ENCOUNTER — Other Ambulatory Visit: Payer: Self-pay | Admitting: Internal Medicine

## 2013-09-11 ENCOUNTER — Ambulatory Visit (INDEPENDENT_AMBULATORY_CARE_PROVIDER_SITE_OTHER): Payer: Medicare Other | Admitting: Internal Medicine

## 2013-09-11 ENCOUNTER — Encounter: Payer: Self-pay | Admitting: Internal Medicine

## 2013-09-11 VITALS — BP 110/70 | HR 86 | Temp 97.9°F | Resp 20 | Ht 62.0 in | Wt 200.0 lb

## 2013-09-11 DIAGNOSIS — Z860100 Personal history of colon polyps, unspecified: Secondary | ICD-10-CM

## 2013-09-11 DIAGNOSIS — E785 Hyperlipidemia, unspecified: Secondary | ICD-10-CM

## 2013-09-11 DIAGNOSIS — Z0000 Encounter for general adult medical examination without abnormal findings: Secondary | ICD-10-CM

## 2013-09-11 DIAGNOSIS — Z9989 Dependence on other enabling machines and devices: Secondary | ICD-10-CM

## 2013-09-11 DIAGNOSIS — E039 Hypothyroidism, unspecified: Secondary | ICD-10-CM

## 2013-09-11 DIAGNOSIS — K5730 Diverticulosis of large intestine without perforation or abscess without bleeding: Secondary | ICD-10-CM

## 2013-09-11 DIAGNOSIS — F3130 Bipolar disorder, current episode depressed, mild or moderate severity, unspecified: Secondary | ICD-10-CM

## 2013-09-11 DIAGNOSIS — G4733 Obstructive sleep apnea (adult) (pediatric): Secondary | ICD-10-CM

## 2013-09-11 DIAGNOSIS — Z23 Encounter for immunization: Secondary | ICD-10-CM

## 2013-09-11 LAB — COMPREHENSIVE METABOLIC PANEL
ALBUMIN: 3.7 g/dL (ref 3.5–5.2)
ALT: 26 U/L (ref 0–35)
AST: 28 U/L (ref 0–37)
Alkaline Phosphatase: 42 U/L (ref 39–117)
BUN: 20 mg/dL (ref 6–23)
CHLORIDE: 107 meq/L (ref 96–112)
CO2: 28 mEq/L (ref 19–32)
Calcium: 9.2 mg/dL (ref 8.4–10.5)
Creatinine, Ser: 0.9 mg/dL (ref 0.4–1.2)
GFR: 68.07 mL/min (ref >60.00)
Glucose, Bld: 104 mg/dL — ABNORMAL HIGH (ref 70–99)
POTASSIUM: 4.2 meq/L (ref 3.5–5.1)
Sodium: 143 mEq/L (ref 135–145)
Total Bilirubin: 0.6 mg/dL (ref 0.3–1.2)
Total Protein: 7 g/dL (ref 6.0–8.3)

## 2013-09-11 LAB — CBC WITH DIFFERENTIAL/PLATELET
Basophils Absolute: 0 10*3/uL (ref 0.0–0.1)
Basophils Relative: 0.3 % (ref 0.0–3.0)
EOS ABS: 0 10*3/uL (ref 0.0–0.7)
Eosinophils Relative: 0.6 % (ref 0.0–5.0)
HCT: 38.5 % (ref 36.0–46.0)
Hemoglobin: 13.1 g/dL (ref 12.0–15.0)
Lymphocytes Relative: 32.3 % (ref 12.0–46.0)
Lymphs Abs: 2.1 10*3/uL (ref 0.7–4.0)
MCHC: 34 g/dL (ref 30.0–36.0)
MCV: 95.2 fl (ref 78.0–100.0)
Monocytes Absolute: 0.6 10*3/uL (ref 0.1–1.0)
Monocytes Relative: 9.3 % (ref 3.0–12.0)
NEUTROS PCT: 57.5 % (ref 43.0–77.0)
Neutro Abs: 3.8 10*3/uL (ref 1.4–7.7)
Platelets: 210 10*3/uL (ref 150.0–400.0)
RBC: 4.04 Mil/uL (ref 3.87–5.11)
RDW: 14.1 % (ref 11.5–14.6)
WBC: 6.7 10*3/uL (ref 4.5–10.5)

## 2013-09-11 LAB — TSH: TSH: 2.65 u[IU]/mL (ref 0.35–5.50)

## 2013-09-11 LAB — LIPID PANEL
CHOLESTEROL: 168 mg/dL (ref 0–200)
HDL: 45.5 mg/dL (ref >39.00)
LDL CALC: 87 mg/dL (ref 0–99)
Total CHOL/HDL Ratio: 4
Triglycerides: 179 mg/dL — ABNORMAL HIGH (ref 0.0–149.0)
VLDL: 35.8 mg/dL (ref 0.0–40.0)

## 2013-09-11 MED ORDER — ATORVASTATIN CALCIUM 10 MG PO TABS: ORAL_TABLET | ORAL | Status: DC

## 2013-09-11 MED ORDER — LEVOTHYROXINE SODIUM 100 MCG PO TABS: ORAL_TABLET | ORAL | Status: DC

## 2013-09-11 NOTE — Progress Notes (Signed)
Pre-visit discussion using our clinic review tool. No additional management support is needed unless otherwise documented below in the visit note.  

## 2013-09-11 NOTE — Progress Notes (Signed)
Patient ID: Emily Castaneda, female   DOB: 1935/07/24, 79 y.o.   MRN: [id number]  Subjective:    Patient ID: Emily Castaneda, female    DOB: 17-May-1935, 79 y.o.   MRN: [id number]  Hyperlipidemia Pertinent negatives include no chest pain or shortness of breath.   CC: cpx - doing well .   History of Present Illness:   79  year-old patient who is seen today for a comprehensive evaluation. She has a history of depression and is followed by psychiatry. She is followed by GYN. She has a history of dyslipidemia hypothyroidism, and a history of colonic polyps. She remains on Lipitor 10 mg daily.  Last colonoscopy was 2013.   Here for Medicare AWV:   1. Risk factors based on Past M, S, F history: personal risk factors include dyslipidemia. She has a family history of coronary artery disease and a personal history of colonic polyps.  2. Physical Activities: no activity restrictions, fairly sedentary  3. Depression/mood: history of bipolar depression, which has been stable followed by psychiatry  4. Hearing: no hearing deficits  5. ADL's: completely independent in all aspects of daily living  6. Fall Risk: low  7. Home Safety: no problems identified  8. Height, weight, &visual acuity:no change in high at or weight. Visual acuity is normal. Does have annual eye examinations  9. Counseling: heart healthy diet regular. Exercise and modest weight loss. All encouraged  10. Labs ordered based on risk factors: complete laboratory profile, including lipid panel will be reviewed  11. Referral Coordination- mammogram this fall  12. Care Plan- heart healthy diet calorie restricted diet and exercise regimen. All encouraged  13. Cognitive Assessment- alert and oriented, with normal affect   Allergies (verified):  No Known Drug Allergies   Past History:  Past Medical History:  Colonic polyps, hx of  Sharlett Iles) Depression ( Dr Abner Greenspan)  Diverticulosis, colon  Hyperlipidemia  Hypothyroidism  menopausal  syndrome  OSA (CPAP) 17 cm (Clint Young)   Past Surgical History:   Appendectomy 2002  Thyroidectomy  colonoscopy August 2007  2013  Family History:   father died att 8, complications of coronary artery disease  mother died at 37-history of diabetes  one brother, status post CABG  Family History of CAD Female 1st degree relative <50  family history also positive for breast and lung cancer   Social History:   married two daughters, 4 grandchildren    Review of Systems  Constitutional: Negative for fever, appetite change, fatigue and unexpected weight change.  HENT: Negative for congestion, dental problem, ear pain, hearing loss, mouth sores, nosebleeds, sinus pressure, sore throat, tinnitus, trouble swallowing and voice change.   Eyes: Negative for photophobia, pain, redness and visual disturbance.  Respiratory: Negative for cough, chest tightness and shortness of breath.   Cardiovascular: Negative for chest pain, palpitations and leg swelling.  Gastrointestinal: Negative for nausea, vomiting, abdominal pain, diarrhea, constipation, blood in stool, abdominal distention and rectal pain.  Genitourinary: Negative for dysuria, urgency, frequency, hematuria, flank pain, vaginal bleeding, vaginal discharge, difficulty urinating, genital sores, vaginal pain, menstrual problem and pelvic pain.  Musculoskeletal: Negative for arthralgias, back pain and neck stiffness.  Skin: Negative for rash.  Neurological: Negative for dizziness, syncope, speech difficulty, weakness, light-headedness, numbness and headaches.  Hematological: Negative for adenopathy. Does not bruise/bleed easily.  Psychiatric/Behavioral: Negative for suicidal ideas, behavioral problems, self-injury, dysphoric mood and agitation. The patient is not nervous/anxious.        Objective:   Physical Exam  Constitutional: She is oriented to person, place, and time. She appears well-developed and well-nourished.  HENT:  Head:  Normocephalic and atraumatic.  Right Ear: External ear normal.  Left Ear: External ear normal.  Mouth/Throat: Oropharynx is clear and moist.  Eyes: Conjunctivae and EOM are normal.  Neck: Normal range of motion. Neck supple. No JVD present. No thyromegaly present.  Cardiovascular: Normal rate, regular rhythm, normal heart sounds and intact distal pulses.   No murmur heard. Right dorsalis pedis pulse diminished  Pulmonary/Chest: Effort normal and breath sounds normal. She has no wheezes. She has no rales.  Abdominal: Soft. Bowel sounds are normal. She exhibits no distension and no mass. There is no tenderness. There is no rebound and no guarding.  Obese Lower midline scar  Genitourinary: Vagina normal.  Musculoskeletal: Normal range of motion. She exhibits no edema and no tenderness.  Neurological: She is alert and oriented to person, place, and time. She has normal reflexes. No cranial nerve deficit. She exhibits normal muscle tone. Coordination normal.  Skin: Skin is warm and dry. No rash noted.  Psychiatric: She has a normal mood and affect. Her behavior is normal.          Assessment & Plan:   Preventive health examination Hypothyroidism. We'll check a TSH Hyperlipidemia. A lipid profile be reviewed Exogenous obesity. Weight loss exercise encouraged History colonic polyps. History depression. Followup psychiatry Menopausal syndrome. A recent mammogram has been performed;  she does receive annual gynecologic care OSA on CPAP. Stable   Recheck 1 year or as needed All medications refilled

## 2013-09-11 NOTE — Patient Instructions (Signed)
Limit your sodium (Salt) intake    It is important that you exercise regularly, at least 20 minutes 3 to 4 times per week.  If you develop chest pain or shortness of breath seek  medical attention.  You need to lose weight.  Consider a lower calorie diet and regular exercise.  Take a calcium supplement, plus [phone] units of vitamin D  Return in one year for follow-up

## 2013-11-12 ENCOUNTER — Telehealth: Payer: Self-pay | Admitting: Internal Medicine

## 2013-11-12 MED ORDER — HYDROCODONE-HOMATROPINE 5-1.5 MG/5ML PO SYRP: 5.0000 mL | ORAL_SOLUTION | Freq: Four times a day (QID) | ORAL | Status: DC | PRN

## 2013-11-12 MED ORDER — AZITHROMYCIN 250 MG PO TABS: ORAL_TABLET | ORAL | Status: DC

## 2013-11-12 NOTE — Telephone Encounter (Signed)
Noted and Dr. Raliegh Ip aware, see other message.

## 2013-11-12 NOTE — Telephone Encounter (Signed)
Pt's husband calling on behalf of pt to inform PCP that when they last spoke earlier today the patient was not running a fever.  However, she now has a fever of 101.

## 2013-11-12 NOTE — Telephone Encounter (Signed)
Called Emily Castaneda back told him Dr. Raliegh Ip is going to send Z-Pak Rx into the pharmacy. Emily Castaneda verbalized understanding.

## 2013-11-12 NOTE — Telephone Encounter (Signed)
Discussed with Dr. Raliegh Ip, okay to send Z-pak into pharmacy.

## 2013-11-12 NOTE — Telephone Encounter (Signed)
Hydromet 6 ounces 1 teaspoon  every 6 hours as needed for cough and congestion 

## 2013-11-12 NOTE — Telephone Encounter (Signed)
Pt hus is calling back requesting dr Raliegh Ip to call him personally

## 2013-11-12 NOTE — Telephone Encounter (Signed)
Spoke to pt's husband Cornelia Copa, told him Rx for cough medicine is ready for pickup. He said he talked to Dr. Raliegh Ip little while ago and he asked if pt had fever he said no but when he checked it again at 2:00 pt temp was 101 degrees to let Dr. Raliegh Ip know incase he wants to order something else. Told him okay will let him know and get back to him.

## 2013-11-12 NOTE — Telephone Encounter (Signed)
Pt's husband is calling wanting to know if dr. Raliegh Ip can call in something for his wife, states she has real bad chest congestion and coughing. Pt states she is not feeling well enough to come in. Requesting nurse call with recommendation on what to do. Send to [facility] on [address]

## 2013-11-12 NOTE — Telephone Encounter (Signed)
Please advise 

## 2013-11-13 ENCOUNTER — Inpatient Hospital Stay (HOSPITAL_COMMUNITY)
Admission: EM | Admit: 2013-11-13 | Discharge: 2013-11-17 | DRG: 202 | Disposition: A | Discharge status: Home / Self Care | Payer: Medicare Other | Attending: Internal Medicine | Admitting: Internal Medicine

## 2013-11-13 ENCOUNTER — Encounter (HOSPITAL_COMMUNITY): Payer: Self-pay | Admitting: Emergency Medicine

## 2013-11-13 ENCOUNTER — Emergency Department (HOSPITAL_COMMUNITY): Payer: Medicare Other

## 2013-11-13 DIAGNOSIS — J219 Acute bronchiolitis, unspecified: Secondary | ICD-10-CM | POA: Diagnosis present

## 2013-11-13 DIAGNOSIS — J9600 Acute respiratory failure, unspecified whether with hypoxia or hypercapnia: Secondary | ICD-10-CM | POA: Diagnosis present

## 2013-11-13 DIAGNOSIS — Z803 Family history of malignant neoplasm of breast: Secondary | ICD-10-CM

## 2013-11-13 DIAGNOSIS — Z8249 Family history of ischemic heart disease and other diseases of the circulatory system: Secondary | ICD-10-CM

## 2013-11-13 DIAGNOSIS — E785 Hyperlipidemia, unspecified: Secondary | ICD-10-CM | POA: Diagnosis present

## 2013-11-13 DIAGNOSIS — R509 Fever, unspecified: Secondary | ICD-10-CM | POA: Insufficient documentation

## 2013-11-13 DIAGNOSIS — Z860100 Personal history of colon polyps, unspecified: Secondary | ICD-10-CM

## 2013-11-13 DIAGNOSIS — R0902 Hypoxemia: Secondary | ICD-10-CM

## 2013-11-13 DIAGNOSIS — F3289 Other specified depressive episodes: Secondary | ICD-10-CM

## 2013-11-13 DIAGNOSIS — E039 Hypothyroidism, unspecified: Secondary | ICD-10-CM

## 2013-11-13 DIAGNOSIS — F319 Bipolar disorder, unspecified: Secondary | ICD-10-CM | POA: Diagnosis present

## 2013-11-13 DIAGNOSIS — J218 Acute bronchiolitis due to other specified organisms: Principal | ICD-10-CM

## 2013-11-13 DIAGNOSIS — G4733 Obstructive sleep apnea (adult) (pediatric): Secondary | ICD-10-CM

## 2013-11-13 DIAGNOSIS — D72829 Elevated white blood cell count, unspecified: Secondary | ICD-10-CM | POA: Diagnosis present

## 2013-11-13 DIAGNOSIS — J209 Acute bronchitis, unspecified: Secondary | ICD-10-CM | POA: Diagnosis present

## 2013-11-13 DIAGNOSIS — Z833 Family history of diabetes mellitus: Secondary | ICD-10-CM

## 2013-11-13 DIAGNOSIS — F329 Major depressive disorder, single episode, unspecified: Secondary | ICD-10-CM

## 2013-11-13 DIAGNOSIS — Z79899 Other long term (current) drug therapy: Secondary | ICD-10-CM

## 2013-11-13 DIAGNOSIS — I10 Essential (primary) hypertension: Secondary | ICD-10-CM | POA: Diagnosis present

## 2013-11-13 DIAGNOSIS — J9601 Acute respiratory failure with hypoxia: Secondary | ICD-10-CM | POA: Diagnosis present

## 2013-11-13 DIAGNOSIS — Z9989 Dependence on other enabling machines and devices: Secondary | ICD-10-CM

## 2013-11-13 DIAGNOSIS — Z87891 Personal history of nicotine dependence: Secondary | ICD-10-CM

## 2013-11-13 DIAGNOSIS — F32A Depression, unspecified: Secondary | ICD-10-CM | POA: Diagnosis present

## 2013-11-13 LAB — COMPREHENSIVE METABOLIC PANEL
ALK PHOS: 50 U/L (ref 39–117)
ALT: 21 U/L (ref 0–35)
AST: 25 U/L (ref 0–37)
Albumin: 3.3 g/dL — ABNORMAL LOW (ref 3.5–5.2)
BILIRUBIN TOTAL: 0.5 mg/dL (ref 0.3–1.2)
BUN: 29 mg/dL — ABNORMAL HIGH (ref 6–23)
CHLORIDE: 97 meq/L (ref 96–112)
CO2: 25 meq/L (ref 19–32)
Calcium: 9.5 mg/dL (ref 8.4–10.5)
Creatinine, Ser: 1.03 mg/dL (ref 0.50–1.10)
GFR calc Af Amer: 60 mL/min — ABNORMAL LOW (ref >90)
GFR, EST NON AFRICAN AMERICAN: 51 mL/min — AB (ref >90)
Glucose, Bld: 126 mg/dL — ABNORMAL HIGH (ref 70–99)
Potassium: 4.6 mEq/L (ref 3.7–5.3)
SODIUM: 138 meq/L (ref 137–147)
Total Protein: 7.6 g/dL (ref 6.0–8.3)

## 2013-11-13 LAB — CBC WITH DIFFERENTIAL/PLATELET
Basophils Absolute: 0 10*3/uL (ref 0.0–0.1)
Basophils Relative: 0 % (ref 0–1)
Eosinophils Absolute: 0 10*3/uL (ref 0.0–0.7)
Eosinophils Relative: 0 % (ref 0–5)
HCT: 38.4 % (ref 36.0–46.0)
Hemoglobin: 13 g/dL (ref 12.0–15.0)
LYMPHS ABS: 1.1 10*3/uL (ref 0.7–4.0)
Lymphocytes Relative: 9 % — ABNORMAL LOW (ref 12–46)
MCH: 32.5 pg (ref 26.0–34.0)
MCHC: 33.9 g/dL (ref 30.0–36.0)
MCV: 96 fL (ref 78.0–100.0)
MONOS PCT: 14 % — AB (ref 3–12)
Monocytes Absolute: 1.7 10*3/uL — ABNORMAL HIGH (ref 0.1–1.0)
Neutro Abs: 9.2 10*3/uL — ABNORMAL HIGH (ref 1.7–7.7)
Neutrophils Relative %: 77 % (ref 43–77)
PLATELETS: 140 10*3/uL — AB (ref 150–400)
RBC: 4 MIL/uL (ref 3.87–5.11)
RDW: 13 % (ref 11.5–15.5)
WBC: 12 10*3/uL — ABNORMAL HIGH (ref 4.0–10.5)

## 2013-11-13 LAB — I-STAT CG4 LACTIC ACID, ED: Lactic Acid, Venous: 3.5 mmol/L — ABNORMAL HIGH (ref 0.5–2.2)

## 2013-11-13 MED ORDER — CEFTRIAXONE SODIUM 1 G IJ SOLR: 1.0000 g | INTRAMUSCULAR | Status: DC

## 2013-11-13 MED ORDER — SODIUM CHLORIDE 0.9 % IV SOLN
1000.0000 mL | Freq: Once | INTRAVENOUS | Status: AC
  Administered 2013-11-13: 1000 mL via INTRAVENOUS

## 2013-11-13 MED ORDER — DEXTROSE 5 % IV SOLN: 500.0000 mg | INTRAVENOUS | Status: DC

## 2013-11-13 MED ORDER — DEXTROSE 5 % IV SOLN
1.0000 g | Freq: Once | INTRAVENOUS | Status: AC
  Administered 2013-11-13: 1 g via INTRAVENOUS
  Filled 2013-11-13: qty 10

## 2013-11-13 MED ORDER — SODIUM CHLORIDE 0.9 % IV SOLN: 1000.0000 mL | Freq: Once | INTRAVENOUS | Status: DC

## 2013-11-13 MED ORDER — DEXTROSE 5 % IV SOLN
500.0000 mg | Freq: Once | INTRAVENOUS | Status: AC
  Administered 2013-11-13: 500 mg via INTRAVENOUS

## 2013-11-13 MED ORDER — SODIUM CHLORIDE 0.9 % IV SOLN: 1000.0000 mL | INTRAVENOUS | Status: DC

## 2013-11-13 NOTE — ED Notes (Signed)
Pt began having cough, congestion, fever x 4 days.

## 2013-11-13 NOTE — ED Notes (Signed)
EKG given to EDP,Knapp,J. For review. 

## 2013-11-13 NOTE — ED Notes (Signed)
Pt. On cardiac monitor. 

## 2013-11-13 NOTE — ED Provider Notes (Signed)
CSN: [id number]     Arrival date & time 11/13/13  1942 History   First MD Initiated Contact with Patient 11/13/13 1959     Chief Complaint  Patient presents with  . Fever  . Cough    HPI Patient presents to emergency room with complaints of cough, congestion and fever for the last 4 days. Patient has had an occasional cough but rarely has felt like she's had a lot of mucus in her chest and sinuses. She's also had some throat discomfort. She has had a fever for the last couple of days with the highest being 104. She spoke with her primary care doctor initially recommended over-the-counter cough medications. Family called back when she started having fevers and an antibiotic, azithromycin, was called in for her. Patient feels like her symptoms have not been getting any better. She has felt somewhat weak. She denies any vomiting or diarrhea. She's had some slight nausea at times. She denies any dysuria. She has not noticed any rashes. Past Medical History  Diagnosis Date  . Hx of colonic polyps   . Depression     Dr. Abner Greenspan  . Diverticulosis of colon   . Hyperlipidemia   . Hypothyroidism   . Menopausal syndrome   . OSA (obstructive sleep apnea)     cpap; 17 cm (clint young)  . Endometriosis   . Manic, depressive    Past Surgical History  Procedure Laterality Date  . Polypectomy      Dr. Ubaldo Glassing  . Colonoscopy    . Appendectomy    . Tonsillectomy and adenoidectomy     Family History  Problem Relation Age of Onset  . Diabetes Mother   . Heart disease Father     complications of coronary heart disease  . Heart disease Other   . Cancer Other     BREAST AND LUNG   . Diabetes Brother   . Colon cancer Neg Hx   . Rectal cancer Neg Hx   . Stomach cancer Neg Hx   . Breast cancer Maternal Aunt   . Cancer Maternal Grandfather     LUNG  . Breast cancer Daughter    History  Substance Use Topics  . Smoking status: Former Smoker    Types: Cigarettes    Quit date: 08/21/1983  .  Smokeless tobacco: Never Used     Comment: only smoked socially  . Alcohol Use: .5 - 1 oz/week    1-2 drink(s) per week     Comment: occ glass of wine    OB History   Grav Para Term Preterm Abortions TAB SAB Ect Mult Living   3 3        3      Review of Systems  All other systems reviewed and are negative.      Allergies  Review of patient's allergies indicates no known allergies.  Home Medications   Current Outpatient Rx  Name  Route  Sig  Dispense  Refill  . atorvastatin (LIPITOR) 10 MG tablet   Oral   Take 10 mg by mouth daily.         Marland Kitchen azithromycin (ZITHROMAX) 250 MG tablet   Oral   Take 250-500 mg by mouth daily. Take 2 tablets on day 1 then take 1 tablet daily until finished         . cholecalciferol (VITAMIN D) 1000 UNITS tablet   Oral   Take 1,000 Units by mouth daily.         Marland Kitchen  clonazePAM (KLONOPIN) 1 MG tablet   Oral   Take 1 mg by mouth at bedtime.          . divalproex (DEPAKOTE ER) 250 MG 24 hr tablet   Oral   Take 1,250 mg by mouth every evening.         . eszopiclone (LUNESTA) 2 MG TABS   Oral   Take 2 mg by mouth at bedtime as needed for sleep. Take immediately before bedtime         . guaiFENesin (MUCINEX) 600 MG 12 hr tablet   Oral   Take 600 mg by mouth 2 (two) times daily as needed for to loosen phlegm.         Marland Kitchen HYDROcodone-homatropine (HYCODAN) 5-1.5 MG/5ML syrup   Oral   Take 5 mLs by mouth every 6 (six) hours as needed for cough.   120 mL   0   . levothyroxine (SYNTHROID, LEVOTHROID) 100 MCG tablet   Oral   Take 100 mcg by mouth daily before breakfast.          BP 127/60  Pulse 124  Temp(Src) 100.2 F (37.9 C) (Oral)  Resp 22  Ht 5\' 2"  (1.575 m)  Wt 190 lb (86.183 kg)  BMI 34.74 kg/m2  SpO2 93% Physical Exam  Nursing note and vitals reviewed. Constitutional: No distress.  HENT:  Head: Normocephalic and atraumatic.  Right Ear: External ear normal.  Left Ear: External ear normal.  Mouth/Throat: No  oropharyngeal exudate.  Eyes: Conjunctivae are normal. Right eye exhibits no discharge. Left eye exhibits no discharge. No scleral icterus.  Neck: Neck supple. No tracheal deviation present.  Cardiovascular: Normal rate, regular rhythm and intact distal pulses.   Pulmonary/Chest: Effort normal. No stridor. No respiratory distress. She has no wheezes (And few rhonchi). She has rales.  Abdominal: Soft. Bowel sounds are normal. She exhibits no distension. There is no tenderness. There is no rebound and no guarding.  Musculoskeletal: She exhibits no edema and no tenderness.  Neurological: She is alert. She has normal strength. No cranial nerve deficit (no facial droop, extraocular movements intact, no slurred speech) or sensory deficit. She exhibits normal muscle tone. She displays no seizure activity. Coordination normal.  Skin: Skin is warm and dry. No rash noted.  Psychiatric: She has a normal mood and affect.    ED Course  Procedures (including critical care time) Labs Review Labs Reviewed  CBC WITH DIFFERENTIAL - Abnormal; Notable for the following:    WBC 12.0 (*)    Platelets 140 (*)    Lymphocytes Relative 9 (*)    Monocytes Relative 14 (*)    Neutro Abs 9.2 (*)    Monocytes Absolute 1.7 (*)    All other components within normal limits  COMPREHENSIVE METABOLIC PANEL - Abnormal; Notable for the following:    Glucose, Bld 126 (*)    BUN 29 (*)    Albumin 3.3 (*)    GFR calc non Af Amer 51 (*)    GFR calc Af Amer 60 (*)    All other components within normal limits  I-STAT CG4 LACTIC ACID, ED - Abnormal; Notable for the following:    Lactic Acid, Venous 3.50 (*)    All other components within normal limits  CULTURE, BLOOD (ROUTINE X 2)  CULTURE, BLOOD (ROUTINE X 2)  URINE CULTURE  URINALYSIS, ROUTINE W REFLEX MICROSCOPIC   Imaging Review Dg Chest 2 View  11/13/2013   CLINICAL DATA:  Fever, cough and congestion.  EXAM: CHEST  2 VIEW  COMPARISON:  None.  FINDINGS: Cardiac  silhouette is mildly enlarged. Normal mediastinal and hilar contours.  Clear lungs.  No pleural effusion or pneumothorax.  The bony thorax is intact.  IMPRESSION: No acute cardiopulmonary disease.   Electronically Signed   By: Lajean Manes M.D.   On: 11/13/2013 21:33     EKG Interpretation   Date/Time:  Friday November 13 2013 19:51:07 EDT Ventricular Rate:  121 PR Interval:  172 QRS Duration: 90 QT Interval:  290 QTC Calculation: 411 R Axis:   45 Text Interpretation:  Sinus tachycardia Since last tracing rate faster  Confirmed by Jullian Previti  MD-J, Jameriah Trotti ([id number]) on 11/13/2013 7:57:29 PM     Medications  0.9 %  sodium chloride infusion (1,000 mLs Intravenous New Bag/Given 11/13/13 2102)    Followed by  0.9 %  sodium chloride infusion (not administered)    Followed by  0.9 %  sodium chloride infusion (not administered)  cefTRIAXone (ROCEPHIN) 1 g in dextrose 5 % 50 mL IVPB (not administered)  azithromycin (ZITHROMAX) 500 mg in dextrose 5 % 250 mL IVPB (not administered)  cefTRIAXone (ROCEPHIN) 1 g in dextrose 5 % 50 mL IVPB (1 g Intravenous New Bag/Given 11/13/13 2249)  azithromycin (ZITHROMAX) 500 mg in dextrose 5 % 250 mL IVPB (0 mg Intravenous Stopped 11/13/13 2250)    MDM   Final diagnoses:  Fever    Oxygen saturation is normal on supplemental oxygen.  No pna on cxr however she continues to have very ronchorous breath sounds.  Elevated lactic acid level concerning.  May have subclinical pneumonia versus bronchitis.  Will consult with hospitalist regarding admission for further treatment.    Kathalene Frames, MD 11/13/13 [phone]

## 2013-11-14 ENCOUNTER — Encounter (HOSPITAL_COMMUNITY): Payer: Self-pay | Admitting: *Deleted

## 2013-11-14 DIAGNOSIS — J9600 Acute respiratory failure, unspecified whether with hypoxia or hypercapnia: Secondary | ICD-10-CM

## 2013-11-14 DIAGNOSIS — J219 Acute bronchiolitis, unspecified: Secondary | ICD-10-CM | POA: Diagnosis present

## 2013-11-14 DIAGNOSIS — R509 Fever, unspecified: Secondary | ICD-10-CM

## 2013-11-14 LAB — URINALYSIS, ROUTINE W REFLEX MICROSCOPIC
Bilirubin Urine: NEGATIVE
GLUCOSE, UA: NEGATIVE mg/dL
HGB URINE DIPSTICK: NEGATIVE
KETONES UR: NEGATIVE mg/dL
Leukocytes, UA: NEGATIVE
Nitrite: NEGATIVE
Protein, ur: NEGATIVE mg/dL
Specific Gravity, Urine: 1.019 (ref 1.005–1.030)
Urobilinogen, UA: 0.2 mg/dL (ref 0.0–1.0)
pH: 5.5 (ref 5.0–8.0)

## 2013-11-14 LAB — COMPREHENSIVE METABOLIC PANEL
ALBUMIN: 2.9 g/dL — AB (ref 3.5–5.2)
ALT: 18 U/L (ref 0–35)
AST: 23 U/L (ref 0–37)
Alkaline Phosphatase: 43 U/L (ref 39–117)
BUN: 24 mg/dL — AB (ref 6–23)
CO2: 24 mEq/L (ref 19–32)
Calcium: 9.1 mg/dL (ref 8.4–10.5)
Chloride: 101 mEq/L (ref 96–112)
Creatinine, Ser: 0.85 mg/dL (ref 0.50–1.10)
GFR calc Af Amer: 75 mL/min — ABNORMAL LOW (ref >90)
GFR calc non Af Amer: 65 mL/min — ABNORMAL LOW (ref >90)
Glucose, Bld: 129 mg/dL — ABNORMAL HIGH (ref 70–99)
POTASSIUM: 4.1 meq/L (ref 3.7–5.3)
Sodium: 139 mEq/L (ref 137–147)
TOTAL PROTEIN: 6.9 g/dL (ref 6.0–8.3)
Total Bilirubin: 0.4 mg/dL (ref 0.3–1.2)

## 2013-11-14 LAB — BLOOD GAS, ARTERIAL
Acid-base deficit: 0.9 mmol/L (ref 0.0–2.0)
BICARBONATE: 23.8 meq/L (ref 20.0–24.0)
Drawn by: 317871
O2 Content: 4 L/min
O2 Saturation: 92.6 %
PH ART: 7.37 (ref 7.350–7.450)
Patient temperature: 37
TCO2: 21.7 mmol/L (ref 0–100)
pCO2 arterial: 42.1 mmHg (ref 35.0–45.0)
pO2, Arterial: 64.6 mmHg — ABNORMAL LOW (ref 80.0–100.0)

## 2013-11-14 LAB — CBC WITH DIFFERENTIAL/PLATELET
BASOS PCT: 0 % (ref 0–1)
Basophils Absolute: 0 10*3/uL (ref 0.0–0.1)
EOS PCT: 0 % (ref 0–5)
Eosinophils Absolute: 0 10*3/uL (ref 0.0–0.7)
HEMATOCRIT: 36.3 % (ref 36.0–46.0)
Hemoglobin: 12 g/dL (ref 12.0–15.0)
LYMPHS ABS: 0.9 10*3/uL (ref 0.7–4.0)
Lymphocytes Relative: 9 % — ABNORMAL LOW (ref 12–46)
MCH: 31.9 pg (ref 26.0–34.0)
MCHC: 33.1 g/dL (ref 30.0–36.0)
MCV: 96.5 fL (ref 78.0–100.0)
MONOS PCT: 13 % — AB (ref 3–12)
Monocytes Absolute: 1.2 10*3/uL — ABNORMAL HIGH (ref 0.1–1.0)
NEUTROS ABS: 7.5 10*3/uL (ref 1.7–7.7)
Neutrophils Relative %: 78 % — ABNORMAL HIGH (ref 43–77)
Platelets: 131 10*3/uL — ABNORMAL LOW (ref 150–400)
RBC: 3.76 MIL/uL — ABNORMAL LOW (ref 3.87–5.11)
RDW: 13.1 % (ref 11.5–15.5)
WBC: 9.6 10*3/uL (ref 4.0–10.5)

## 2013-11-14 LAB — EXPECTORATED SPUTUM ASSESSMENT W GRAM STAIN, RFLX TO RESP C

## 2013-11-14 LAB — INFLUENZA PANEL BY PCR (TYPE A & B)
H1N1 flu by pcr: NOT DETECTED
INFLAPCR: NEGATIVE
Influenza B By PCR: NEGATIVE

## 2013-11-14 LAB — LACTIC ACID, PLASMA: Lactic Acid, Venous: 1.4 mmol/L (ref 0.5–2.2)

## 2013-11-14 LAB — STREP PNEUMONIAE URINARY ANTIGEN: Strep Pneumo Urinary Antigen: NEGATIVE

## 2013-11-14 LAB — EXPECTORATED SPUTUM ASSESSMENT W REFEX TO RESP CULTURE

## 2013-11-14 MED ORDER — IPRATROPIUM-ALBUTEROL 0.5-2.5 (3) MG/3ML IN SOLN
3.0000 mL | RESPIRATORY_TRACT | Status: DC
  Administered 2013-11-14 – 2013-11-16 (×16): 3 mL via RESPIRATORY_TRACT
  Filled 2013-11-14 (×14): qty 3

## 2013-11-14 MED ORDER — DEXTROSE 5 % IV SOLN
1.0000 g | INTRAVENOUS | Status: DC
  Administered 2013-11-14 – 2013-11-16 (×3): 1 g via INTRAVENOUS
  Filled 2013-11-14 (×4): qty 10

## 2013-11-14 MED ORDER — DEXTROSE 5 % IV SOLN
1.0000 g | INTRAVENOUS | Status: DC
  Administered 2013-11-14: 1 g via INTRAVENOUS

## 2013-11-14 MED ORDER — LEVOTHYROXINE SODIUM 100 MCG PO TABS
100.0000 ug | ORAL_TABLET | Freq: Every day | ORAL | Status: DC
  Administered 2013-11-14 – 2013-11-17 (×4): 100 ug via ORAL
  Filled 2013-11-14 (×5): qty 1

## 2013-11-14 MED ORDER — ATORVASTATIN CALCIUM 10 MG PO TABS
10.0000 mg | ORAL_TABLET | Freq: Every day | ORAL | Status: DC
  Administered 2013-11-14 – 2013-11-17 (×4): 10 mg via ORAL
  Filled 2013-11-14 (×4): qty 1

## 2013-11-14 MED ORDER — ZOLPIDEM TARTRATE 5 MG PO TABS
5.0000 mg | ORAL_TABLET | Freq: Every evening | ORAL | Status: DC | PRN
  Administered 2013-11-14 – 2013-11-16 (×2): 5 mg via ORAL
  Filled 2013-11-14 (×2): qty 1

## 2013-11-14 MED ORDER — OSELTAMIVIR PHOSPHATE 75 MG PO CAPS
75.0000 mg | ORAL_CAPSULE | Freq: Every day | ORAL | Status: DC
  Administered 2013-11-14: 75 mg via ORAL
  Filled 2013-11-14 (×2): qty 1

## 2013-11-14 MED ORDER — PREDNISONE 50 MG PO TABS
50.0000 mg | ORAL_TABLET | Freq: Every day | ORAL | Status: DC
  Administered 2013-11-14 – 2013-11-15 (×2): 50 mg via ORAL
  Filled 2013-11-14 (×4): qty 1

## 2013-11-14 MED ORDER — ALBUTEROL SULFATE (2.5 MG/3ML) 0.083% IN NEBU: 2.5000 mg | INHALATION_SOLUTION | RESPIRATORY_TRACT | Status: DC | PRN

## 2013-11-14 MED ORDER — DIVALPROEX SODIUM ER 500 MG PO TB24
1250.0000 mg | ORAL_TABLET | Freq: Every evening | ORAL | Status: DC
  Administered 2013-11-14 – 2013-11-16 (×3): 1250 mg via ORAL
  Filled 2013-11-14 (×4): qty 1

## 2013-11-14 MED ORDER — DEXTROSE 5 % IV SOLN
500.0000 mg | INTRAVENOUS | Status: DC
  Administered 2013-11-14: 500 mg via INTRAVENOUS

## 2013-11-14 MED ORDER — SODIUM CHLORIDE 0.9 % IV SOLN
INTRAVENOUS | Status: DC
  Administered 2013-11-14: 02:00:00 via INTRAVENOUS

## 2013-11-14 MED ORDER — CLONAZEPAM 1 MG PO TABS
1.0000 mg | ORAL_TABLET | Freq: Every day | ORAL | Status: DC
  Administered 2013-11-14 – 2013-11-16 (×3): 1 mg via ORAL
  Filled 2013-11-14 (×3): qty 1

## 2013-11-14 MED ORDER — GUAIFENESIN ER 600 MG PO TB12
600.0000 mg | ORAL_TABLET | Freq: Two times a day (BID) | ORAL | Status: DC
  Administered 2013-11-14 – 2013-11-17 (×7): 600 mg via ORAL
  Filled 2013-11-14 (×10): qty 1

## 2013-11-14 MED ORDER — DEXTROSE 5 % IV SOLN
500.0000 mg | INTRAVENOUS | Status: DC
  Administered 2013-11-14 – 2013-11-16 (×3): 500 mg via INTRAVENOUS
  Filled 2013-11-14 (×4): qty 500

## 2013-11-14 MED ORDER — ENOXAPARIN SODIUM 40 MG/0.4ML ~~LOC~~ SOLN
40.0000 mg | SUBCUTANEOUS | Status: DC
  Administered 2013-11-14 – 2013-11-17 (×4): 40 mg via SUBCUTANEOUS
  Filled 2013-11-14 (×4): qty 0.4

## 2013-11-14 NOTE — Progress Notes (Addendum)
[facility] PROGRESS NOTE  Emily Castaneda RXV:[id number] DOB: March 17, 1935 DOA: 11/13/2013 PCP: Nyoka Cowden, MD  Assessment/Plan: Acute bronchitis/ bronchiolitis Continue O2 via nasal cannula, empiric IV Rocephin and azithromycin. Continue scheduled duonebs and add oral steroids. Follow blood culture, sputum culture, urine negative for strep antigen. Pending legionella antigen. ABG on admission showed mild hypoxia. elevated lactic acid on admission which has now resolved. -Flu PCR pending. On empiric Tamiflu -supportive care  with Tylenol and antitussives.   Obstructive sleep apnea  CPAP at bedtime    Hypothyroidism  Continue Synthroid    Depression and mood disorder  Continue Depakote     DVT Prophylaxis: subcutaneous Heparin  Nutrition: Advance as tolerated regular    Code Status: Full code Family Communication: None at bedside Disposition Plan: Home once improved   Consultants:  None  Procedures:  None  Antibiotics:  IV Rocephin and azithromycin  HPI/Subjective: Patient sen and examined. C/o productive cough with whitish phlegm with pleuritic chest pain on coughing and SOB on exertion  Objective: Filed Vitals:   11/14/13 0423  BP: 100/86  Pulse: 89  Temp: 98.8 F (37.1 C)  Resp: 20    Intake/Output Summary (Last 24 hours) at 11/14/13 1010 Last data filed at 11/14/13 0630  Gross per 24 hour  Intake    225 ml  Output      0 ml  Net    225 ml   Filed Weights   11/13/13 1945 11/13/13 2035 11/14/13 0126  Weight: 90.719 kg (200 lb) 86.183 kg (190 lb) 87.8 kg (193 lb 9 oz)    Exam:   General:  She female lying in bed in no acute distress  HEENT: No pallor, no icterus, moist oral mucosa,  Cardiovascular: Normal S1-S2, no murmurs or gallops  Respiratory: Bilateral coarse breath sounds with scattered wheezing and rhonchi  Abdomen: Soft, nontender, nondistended, bowel sounds present  Extremities: Warm, no edema  CNS: AAO  x3    Data Reviewed: Basic Metabolic Panel:  Recent Labs Lab 11/13/13 2000 11/14/13 0403  NA 138 139  K 4.6 4.1  CL 97 101  CO2 25 24  GLUCOSE 126* 129*  BUN 29* 24*  CREATININE 1.03 0.85  CALCIUM 9.5 9.1   Liver Function Tests:  Recent Labs Lab 11/13/13 2000 11/14/13 0403  AST 25 23  ALT 21 18  ALKPHOS 50 43  BILITOT 0.5 0.4  PROT 7.6 6.9  ALBUMIN 3.3* 2.9*   No results found for this basename: LIPASE, AMYLASE,  in the last 168 hours No results found for this basename: AMMONIA,  in the last 168 hours CBC:  Recent Labs Lab 11/13/13 2000 11/14/13 0403  WBC 12.0* 9.6  NEUTROABS 9.2* 7.5  HGB 13.0 12.0  HCT 38.4 36.3  MCV 96.0 96.5  PLT 140* 131*   Cardiac Enzymes: No results found for this basename: CKTOTAL, CKMB, CKMBINDEX, TROPONINI,  in the last 168 hours BNP (last 3 results) No results found for this basename: PROBNP,  in the last 8760 hours CBG: No results found for this basename: GLUCAP,  in the last 168 hours  Recent Results (from the past 240 hour(s))  CULTURE, EXPECTORATED SPUTUM-ASSESSMENT     Status: None   Collection Time    11/14/13  4:01 AM      Result Value Ref Range Status   Specimen Description SPU   Final   Special Requests NONE   Final   Sputum evaluation     Final   Value: THIS  SPECIMEN IS ACCEPTABLE. RESPIRATORY CULTURE REPORT TO FOLLOW.   Report Status 11/14/2013 FINAL   Final     Studies: Dg Chest 2 View  11/13/2013   CLINICAL DATA:  Fever, cough and congestion.  EXAM: CHEST  2 VIEW  COMPARISON:  None.  FINDINGS: Cardiac silhouette is mildly enlarged. Normal mediastinal and hilar contours.  Clear lungs.  No pleural effusion or pneumothorax.  The bony thorax is intact.  IMPRESSION: No acute cardiopulmonary disease.   Electronically Signed   By: Lajean Manes M.D.   On: 11/13/2013 21:33    Scheduled Meds: . sodium chloride  1,000 mL Intravenous Once  . atorvastatin  10 mg Oral Daily  . azithromycin  500 mg Intravenous Q24H   . cefTRIAXone (ROCEPHIN)  IV  1 g Intravenous Q24H  . clonazePAM  1 mg Oral QHS  . divalproex  1,250 mg Oral QPM  . enoxaparin (LOVENOX) injection  40 mg Subcutaneous Q24H  . guaiFENesin  600 mg Oral BID  . ipratropium-albuterol  3 mL Nebulization Q4H  . levothyroxine  100 mcg Oral QAC breakfast  . oseltamivir  75 mg Oral Daily   Continuous Infusions: . sodium chloride 50 mL/hr at 11/14/13 0200      Time spent: 25 minutes    Artrell Lawless  [facility] Pager [phone]. If 7PM-7AM, please contact night-coverage at [email or web address], password [email or web address] 11/14/2013, 10:10 AM  LOS: 1 day

## 2013-11-14 NOTE — Progress Notes (Signed)
Order noted for nocturnal CPAP. Equipment available and at the patient bedside. However, she declines at this time. RT will continue to follow.

## 2013-11-14 NOTE — Progress Notes (Signed)
PHARMACY - TAMIFLU  PHARMACIST - PHYSICIAN COMMUNICATION  DR:    CONCERNING: Renal dysfunction & Tamiflu  DESCRIPTION:  This patient has an order for Oseltamivir (Tamiflu) and has an CRCL@ 47 ml/min. The package insert for Oseltamivir recommends 30mg  BID x 5 days for patients with CrCl 30-60 ml/min.  To preserve an adequate supply of 30mg  capsules for our patients with more significant renal impairment the Infectious Diseases team has recommended to substitute Oseltamivir 75mg  qday x 5 days for patients with CrCl 30-60 ml/min at this time.   RECOMMENDATION:  Oseltamivir 75mg  PO DAILY x 5 days has been substituted for your patient. If you have any questions about this temporary substitution please feel free to call the pharmacy @ [phone].  Thank you, Leone Haven, PharmD

## 2013-11-14 NOTE — H&P (Signed)
[facility] History and Physical  Patient: Emily Castaneda  NWG:[id number]  DOB: [birth year]  DOS: the patient was seen and examined on 11/13/2013 PCP: Nyoka Cowden, MD  Chief Complaint: Shortness of breath and cough  HPI: Emily Castaneda is a 79 y.o. female with Past medical history of hypothyroidism, sleep apnea, hypertension, dyslipidemia. The patient presented with progressively worsening shortness of breath with cough and congestion. She mentions on Monday she was started having some nasal congestion. 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 From Wednesday onwards the patient started having progressively worsening symptoms of congestion and cough with whitish expectoration and without any blood. She started having fever. Initially her PCP recommended symptomatic treatments suspecting viral bronchitis. But she continues to have fever and progressively worsening shortness of breath for weeks her PCP placed on Z-Pak. She has taken 3 tablets of Z-Pak despite which her symptoms were not improving and her shortness of breath and generalized weakness for progressively worsening and therefore they brought her to the hospital. Patient denies any episodes of dizziness or lightheadedness, chest pain or chest tightness, nausea or vomiting, aspiration, diarrhea, burning urination. She has chronic leg swelling which is unchanged she denies any orthopnea or PND. She mentions when she lies down she starts having cough and cannot lie down on her back She denies any other sick contact or recent hospitalization  The patient is coming from home. And at her baseline is Independent for most of her  ADL.  Review of Systems: as mentioned in the history of present illness.  A Comprehensive review of the other systems is negative.  Past Medical History  Diagnosis Date  . Hx of colonic polyps   . Depression     Dr. Abner Greenspan  .  Diverticulosis of colon   . Hyperlipidemia   . Hypothyroidism   . Menopausal syndrome   . OSA (obstructive sleep apnea)     cpap; 17 cm (clint young)  . Endometriosis   . Manic, depressive    Past Surgical History  Procedure Laterality Date  . Polypectomy      Dr. Ubaldo Glassing  . Colonoscopy    . Appendectomy    . Tonsillectomy and adenoidectomy     Social History:  reports that she quit smoking about 30 years ago. Her smoking use included Cigarettes. She smoked 0.00 packs per day. She has never used smokeless tobacco. She reports that she drinks about 0.5 ounces of alcohol per week. She reports that she does not use illicit drugs.  No Known Allergies  Family History  Problem Relation Age of Onset  . Diabetes Mother   . Heart disease Father     complications of coronary heart disease  . Heart disease Other   . Cancer Other     BREAST AND LUNG   . Diabetes Brother   . Colon cancer Neg Hx   . Rectal cancer Neg Hx   . Stomach cancer Neg Hx   . Breast cancer Maternal Aunt   . Cancer Maternal Grandfather     LUNG  . Breast cancer Daughter     Prior to Admission medications   Medication Sig Start Date End Date Taking? Authorizing Provider  atorvastatin (LIPITOR) 10 MG tablet Take 10 mg by mouth daily.   Yes Historical Provider, MD  azithromycin (ZITHROMAX) 250 MG tablet Take 250-500 mg by mouth daily. Take 2 tablets on day 1 then take 1 tablet daily until finished  Yes Historical Provider, MD  cholecalciferol (VITAMIN D) 1000 UNITS tablet Take 1,000 Units by mouth daily.   Yes Historical Provider, MD  clonazePAM (KLONOPIN) 1 MG tablet Take 1 mg by mouth at bedtime.    Yes Historical Provider, MD  divalproex (DEPAKOTE ER) 250 MG 24 hr tablet Take 1,250 mg by mouth every evening.   Yes Historical Provider, MD  eszopiclone (LUNESTA) 2 MG TABS Take 2 mg by mouth at bedtime as needed for sleep. Take immediately before bedtime   Yes Historical Provider, MD  guaiFENesin (MUCINEX) 600 MG  12 hr tablet Take 600 mg by mouth 2 (two) times daily as needed for to loosen phlegm.   Yes Historical Provider, MD  HYDROcodone-homatropine (HYCODAN) 5-1.5 MG/5ML syrup Take 5 mLs by mouth every 6 (six) hours as needed for cough. 11/12/13  Yes Marletta Lor, MD  levothyroxine (SYNTHROID, LEVOTHROID) 100 MCG tablet Take 100 mcg by mouth daily before breakfast.   Yes Historical Provider, MD    Physical Exam: Filed Vitals:   11/13/13 2035 11/13/13 2045 11/13/13 2215 11/13/13 2300  BP:  125/61 113/50 109/49  Pulse:  111 109 100  Temp:      TempSrc:      Resp:  21 18 20   Height: 5\' 2"  (1.575 m)     Weight: 86.183 kg (190 lb)     SpO2:  91% 95% 96%    General: Alert, Awake and Oriented to Time, Place and Person. Appear in marked distress Eyes: PERRL ENT: Oral Mucosa clear moist. Neck: No JVD Cardiovascular: S1 and S2 Present, aortic systolic Murmur, Peripheral Pulses Present Respiratory: Bilateral Air entry equal and Decreased, bilateral rhonchi and occasional Crackles, no wheezes Abdomen: Bowel Sound Present, Soft and Non tender Skin: No Rash Extremities: Trace Pedal edema, no calf tenderness Neurologic: Grossly Unremarkable.  Labs on Admission:  CBC:  Recent Labs Lab 11/13/13 2000  WBC 12.0*  NEUTROABS 9.2*  HGB 13.0  HCT 38.4  MCV 96.0  PLT 140*    CMP     Component Value Date/Time   NA 138 11/13/2013 2000   K 4.6 11/13/2013 2000   CL 97 11/13/2013 2000   CO2 25 11/13/2013 2000   GLUCOSE 126* 11/13/2013 2000   BUN 29* 11/13/2013 2000   CREATININE 1.03 11/13/2013 2000   CALCIUM 9.5 11/13/2013 2000   PROT 7.6 11/13/2013 2000   ALBUMIN 3.3* 11/13/2013 2000   AST 25 11/13/2013 2000   ALT 21 11/13/2013 2000   ALKPHOS 50 11/13/2013 2000   BILITOT 0.5 11/13/2013 2000   GFRNONAA 51* 11/13/2013 2000   GFRAA 60* 11/13/2013 2000    Radiological Exams on Admission: Dg Chest 2 View  11/13/2013   CLINICAL DATA:  Fever, cough and congestion.  EXAM: CHEST  2 VIEW  COMPARISON:   None.  FINDINGS: Cardiac silhouette is mildly enlarged. Normal mediastinal and hilar contours.  Clear lungs.  No pleural effusion or pneumothorax.  The bony thorax is intact.  IMPRESSION: No acute cardiopulmonary disease.   Electronically Signed   By: Lajean Manes M.D.   On: 11/13/2013 21:33    EKG: Independently reviewed. sinus tachycardia.  Assessment/Plan Principal Problem:   Acute bronchiolitis Active Problems:   HYPOTHYROIDISM   HYPERLIPIDEMIA   DEPRESSION   OSA on CPAP   1. Acute bronchiolitis The patient is presenting with complaints of cough congestion shortness of breath. Her chest x-ray is clear for pneumonia. But she has extensive rhonchi bilaterally on examination. She also appears to  be a deep knee, tachycardic, hypoxic requiring 3 L of oxygen. She has mild leukocytosis and fever. With this suspicion for acute bronchitis/bronchiolitis is high. Possibility of influenza cannot be ruled out. At this point the patient will be admitted to the hospital under telemetry. I would continue her on community acquired coverage with ceftriaxone and azithromycin. I would add influenza PCR and Tamiflu. If the influenza PCR becomes negative I would add steroids. I will place her on duo nebs. I will obtain ABG. I would also add flutter valve and Mucinex for secretion clearance  2. Obstructive sleep apnea CPAP each bedtime  3. Hypothyroidism Continue Synthroid  4. Depression and mood disorder Continue Depakote  DVT Prophylaxis: subcutaneous Heparin Nutrition: Advance as tolerated regular  Code Status: Full  Family Communication: Husband was present at bedside, opportunity was given to ask question and all questions were answered satisfactorily at the time of interview. Disposition: Admitted to inpatient in telemetry unit.  Author: Berle Mull, MD [facility] Pager: [phone]  If 7PM-7AM, please contact night-coverage [email or web address] Password [email or web address]

## 2013-11-15 DIAGNOSIS — J9601 Acute respiratory failure with hypoxia: Secondary | ICD-10-CM | POA: Diagnosis present

## 2013-11-15 LAB — LEGIONELLA ANTIGEN, URINE: Legionella Antigen, Urine: NEGATIVE

## 2013-11-15 LAB — URINE CULTURE
Colony Count: NO GROWTH
Culture: NO GROWTH

## 2013-11-15 NOTE — Progress Notes (Signed)
[facility] PROGRESS NOTE  Emily Castaneda EXH:[id number] DOB: 1935-02-12 DOA: 11/13/2013 PCP: Nyoka Cowden, MD  Assessment/Plan: Acute bronchitis/ bronchiolitis Continue O2 via nasal cannula, maintaining sats on 2L via [name].  empiric IV Rocephin and azithromycin. Continue scheduled duonebs and oral steroids. Follow blood culture, sputum culture, urine negative for strep antigen. Pending legionella antigen. ABG on admission showed mild hypoxia. elevated lactic acid on admission which has now resolved. -Flu PCR negative. . D/c empiric Tamiflu -supportive care  with Tylenol and antitussives.   Obstructive sleep apnea  CPAP at bedtime    Hypothyroidism  Continue Synthroid    Depression and mood disorder  Continue Depakote     DVT Prophylaxis: subcutaneous Heparin  Diet :  regular    Code Status: Full code Family Communication: None at bedside Disposition Plan: Home once improved   Consultants:  None  Procedures:  None  Antibiotics:  IV Rocephin and azithromycin  HPI/Subjective: Patient sen and examined. Cough and SOB improved. Objective: Filed Vitals:   11/15/13 0449  BP: 121/51  Pulse: 85  Temp: 98.3 F (36.8 C)  Resp: 22    Intake/Output Summary (Last 24 hours) at 11/15/13 0945 Last data filed at 11/15/13 0900  Gross per 24 hour  Intake   2910 ml  Output   1350 ml  Net   1560 ml   Filed Weights   11/13/13 1945 11/13/13 2035 11/14/13 0126  Weight: 90.719 kg (200 lb) 86.183 kg (190 lb) 87.8 kg (193 lb 9 oz)    Exam:   General:  She female lying in bed in no acute distress  HEENT: No pallor, no icterus, moist oral mucosa,  Cardiovascular: Normal S1-S2, no murmurs or gallops  Respiratory: Bilateral coarse breath sounds with scattered wheezing and rhonchi. Clearer from yesterday  Abdomen: Soft, nontender, nondistended, bowel sounds present  Extremities: Warm, no edema  CNS: AAO x3    Data Reviewed: Basic Metabolic  Panel:  Recent Labs Lab 11/13/13 2000 11/14/13 0403  NA 138 139  K 4.6 4.1  CL 97 101  CO2 25 24  GLUCOSE 126* 129*  BUN 29* 24*  CREATININE 1.03 0.85  CALCIUM 9.5 9.1   Liver Function Tests:  Recent Labs Lab 11/13/13 2000 11/14/13 0403  AST 25 23  ALT 21 18  ALKPHOS 50 43  BILITOT 0.5 0.4  PROT 7.6 6.9  ALBUMIN 3.3* 2.9*   No results found for this basename: LIPASE, AMYLASE,  in the last 168 hours No results found for this basename: AMMONIA,  in the last 168 hours CBC:  Recent Labs Lab 11/13/13 2000 11/14/13 0403  WBC 12.0* 9.6  NEUTROABS 9.2* 7.5  HGB 13.0 12.0  HCT 38.4 36.3  MCV 96.0 96.5  PLT 140* 131*   Cardiac Enzymes: No results found for this basename: CKTOTAL, CKMB, CKMBINDEX, TROPONINI,  in the last 168 hours BNP (last 3 results) No results found for this basename: PROBNP,  in the last 8760 hours CBG: No results found for this basename: GLUCAP,  in the last 168 hours  Recent Results (from the past 240 hour(s))  CULTURE, BLOOD (ROUTINE X 2)     Status: None   Collection Time    11/13/13  8:39 PM      Result Value Ref Range Status   Specimen Description BLOOD RIGHT ARM   Final   Special Requests BOTTLES DRAWN AEROBIC AND ANAEROBIC 5CC   Final   Culture  Setup Time     Final  Value: 11/13/2013 23:56     Performed at [facility]   Culture     Final   Value:        BLOOD CULTURE RECEIVED NO GROWTH TO DATE CULTURE WILL BE HELD FOR 5 DAYS BEFORE ISSUING A FINAL NEGATIVE REPORT     Performed at [facility]   Report Status PENDING   Incomplete  CULTURE, BLOOD (ROUTINE X 2)     Status: None   Collection Time    11/13/13  8:45 PM      Result Value Ref Range Status   Specimen Description BLOOD RIGHT HAND   Final   Special Requests BOTTLES DRAWN AEROBIC AND ANAEROBIC 5CC   Final   Culture  Setup Time     Final   Value: 11/13/2013 23:56     Performed at [facility]   Culture     Final   Value:        BLOOD CULTURE  RECEIVED NO GROWTH TO DATE CULTURE WILL BE HELD FOR 5 DAYS BEFORE ISSUING A FINAL NEGATIVE REPORT     Performed at [facility]   Report Status PENDING   Incomplete  URINE CULTURE     Status: None   Collection Time    11/13/13 11:48 PM      Result Value Ref Range Status   Specimen Description URINE, CLEAN CATCH   Final   Special Requests NONE   Final   Culture  Setup Time     Final   Value: 11/14/2013 03:39     Performed at [facility]     Final   Value: NO GROWTH     Performed at [facility]   Culture     Final   Value: NO GROWTH     Performed at [facility]   Report Status 11/15/2013 FINAL   Final  CULTURE, EXPECTORATED SPUTUM-ASSESSMENT     Status: None   Collection Time    11/14/13  4:01 AM      Result Value Ref Range Status   Specimen Description SPU   Final   Special Requests NONE   Final   Sputum evaluation     Final   Value: THIS SPECIMEN IS ACCEPTABLE. RESPIRATORY CULTURE REPORT TO FOLLOW.   Report Status 11/14/2013 FINAL   Final  CULTURE, RESPIRATORY (NON-EXPECTORATED)     Status: None   Collection Time    11/14/13  4:01 AM      Result Value Ref Range Status   Specimen Description SPUTUM   Final   Special Requests NONE   Final   Gram Stain     Final   Value: ABUNDANT WBC PRESENT, PREDOMINANTLY PMN     FEW SQUAMOUS EPITHELIAL CELLS PRESENT     MODERATE GRAM POSITIVE COCCI IN PAIRS     IN CLUSTERS IN CHAINS FEW GRAM NEGATIVE RODS     FEW GRAM POSITIVE RODS   Culture PENDING   Incomplete   Report Status PENDING   Incomplete     Studies: Dg Chest 2 View  11/13/2013   CLINICAL DATA:  Fever, cough and congestion.  EXAM: CHEST  2 VIEW  COMPARISON:  None.  FINDINGS: Cardiac silhouette is mildly enlarged. Normal mediastinal and hilar contours.  Clear lungs.  No pleural effusion or pneumothorax.  The bony thorax is intact.  IMPRESSION: No acute cardiopulmonary disease.   Electronically Signed   By: Dedra Skeens.D.  On: 11/13/2013 21:33    Scheduled Meds: . sodium chloride  1,000 mL Intravenous Once  . atorvastatin  10 mg Oral Daily  . azithromycin  500 mg Intravenous Q24H  . cefTRIAXone (ROCEPHIN)  IV  1 g Intravenous Q24H  . clonazePAM  1 mg Oral QHS  . divalproex  1,250 mg Oral QPM  . enoxaparin (LOVENOX) injection  40 mg Subcutaneous Q24H  . guaiFENesin  600 mg Oral BID  . ipratropium-albuterol  3 mL Nebulization Q4H  . levothyroxine  100 mcg Oral QAC breakfast  . oseltamivir  75 mg Oral Daily  . predniSONE  50 mg Oral Q breakfast   Continuous Infusions: . sodium chloride 50 mL/hr at 11/14/13 0200      Time spent: 25 minutes    Aryaan Persichetti  [facility] Pager [phone]. If 7PM-7AM, please contact night-coverage at [email or web address], password [facility] 11/15/2013, 9:45 AM  LOS: 2 days

## 2013-11-15 NOTE — Progress Notes (Signed)
RT spoke to patient about wearing the CPAP and patient stated that she wouldn't feel comfortable wearing the CPAP due to her having to lay flat.  She explained that her having difficulty breathing is causing her to be more upright in the bed.  Patient is currently wearing a nasal cannula 5 L.  RT will continue to monitor.

## 2013-11-16 LAB — CULTURE, RESPIRATORY

## 2013-11-16 LAB — CULTURE, RESPIRATORY W GRAM STAIN: Culture: NORMAL

## 2013-11-16 MED ORDER — IPRATROPIUM-ALBUTEROL 0.5-2.5 (3) MG/3ML IN SOLN
3.0000 mL | Freq: Four times a day (QID) | RESPIRATORY_TRACT | Status: DC
  Administered 2013-11-17: 3 mL via RESPIRATORY_TRACT
  Filled 2013-11-16 (×2): qty 3

## 2013-11-16 MED ORDER — METHYLPREDNISOLONE SODIUM SUCC 125 MG IJ SOLR
60.0000 mg | Freq: Three times a day (TID) | INTRAMUSCULAR | Status: DC
  Administered 2013-11-16 – 2013-11-17 (×3): 60 mg via INTRAVENOUS
  Filled 2013-11-16 (×6): qty 0.96

## 2013-11-16 NOTE — Progress Notes (Signed)
Patient continues to refuse nocturnal CPAP. Order changed to prn per RT protocol.  

## 2013-11-16 NOTE — Progress Notes (Signed)
[facility] PROGRESS NOTE  Emily Castaneda FGH:[id number] DOB: 08-26-1934 DOA: 11/13/2013 PCP: Nyoka Cowden, MD   Brief narrative 79 y/o female with HTN, hypothyroidism, dyslipidemia OSA, presented with acute severe bronchitis with hypoxic respiratory failure.    Assessment/Plan: Acute bronchitis/ bronchiolitis Continue O2 via nasal cannula, maintaining sats on 2L via [place] ( reported to require 5 L at night) .  empiric IV Rocephin and azithromycin. Continue scheduled duonebs, prn albuterol nebs. switch po prednisone to IV solumedrol 60 mg q8 hrs.   blood culture so far negative, sputum culture growing mixed bacteria , urine negative for strep antigen.  legionella antigen negative. ABG on admission showed mild hypoxia. elevated lactic acid on admission which has now resolved. -Flu PCR negative.  -supportive care  with Tylenol and antitussives.   Obstructive sleep apnea  CPAP at bedtime    Hypothyroidism  Continue Synthroid    Depression and mood disorder  Continue Depakote     DVT Prophylaxis: subcutaneous Heparin  Diet :  regular    Code Status: Full code Family Communication: None at bedside Disposition Plan: Home once improved   Consultants:  None  Procedures:  None  Antibiotics:  IV Rocephin and azithromycin  HPI/Subjective: Patient seen and examined. Reports Cough and SOB to be better, however requirng almost 5 L via [place] periodically . Afebrile. ' . Objective: Filed Vitals:   11/16/13 0556  BP: 124/56  Pulse: 85  Temp: 97.6 F (36.4 C)  Resp: 20    Intake/Output Summary (Last 24 hours) at 11/16/13 0911 Last data filed at 11/16/13 0846  Gross per 24 hour  Intake   3350 ml  Output   1275 ml  Net   2075 ml   Filed Weights   11/13/13 1945 11/13/13 2035 11/14/13 0126  Weight: 90.719 kg (200 lb) 86.183 kg (190 lb) 87.8 kg (193 lb 9 oz)    Exam:   General:  She female lying in bed in no acute distress  HEENT: No pallor, no  icterus, moist oral mucosa,  Cardiovascular: Normal S1-S2, no murmurs or gallops  Respiratory: audible wheeze. Bilateral coarse breath sounds with scattered wheezing    Abdomen: Soft, nontender, nondistended, bowel sounds present  Extremities: Warm, no edema  CNS: AAO x3    Data Reviewed: Basic Metabolic Panel:  Recent Labs Lab 11/13/13 2000 11/14/13 0403  NA 138 139  K 4.6 4.1  CL 97 101  CO2 25 24  GLUCOSE 126* 129*  BUN 29* 24*  CREATININE 1.03 0.85  CALCIUM 9.5 9.1   Liver Function Tests:  Recent Labs Lab 11/13/13 2000 11/14/13 0403  AST 25 23  ALT 21 18  ALKPHOS 50 43  BILITOT 0.5 0.4  PROT 7.6 6.9  ALBUMIN 3.3* 2.9*   No results found for this basename: LIPASE, AMYLASE,  in the last 168 hours No results found for this basename: AMMONIA,  in the last 168 hours CBC:  Recent Labs Lab 11/13/13 2000 11/14/13 0403  WBC 12.0* 9.6  NEUTROABS 9.2* 7.5  HGB 13.0 12.0  HCT 38.4 36.3  MCV 96.0 96.5  PLT 140* 131*   Cardiac Enzymes: No results found for this basename: CKTOTAL, CKMB, CKMBINDEX, TROPONINI,  in the last 168 hours BNP (last 3 results) No results found for this basename: PROBNP,  in the last 8760 hours CBG: No results found for this basename: GLUCAP,  in the last 168 hours  Recent Results (from the past 240 hour(s))  CULTURE, BLOOD (ROUTINE X 2)  Status: None   Collection Time    11/13/13  8:39 PM      Result Value Ref Range Status   Specimen Description BLOOD RIGHT ARM   Final   Special Requests BOTTLES DRAWN AEROBIC AND ANAEROBIC 5CC   Final   Culture  Setup Time     Final   Value: 11/13/2013 23:56     Performed at [facility]   Culture     Final   Value:        BLOOD CULTURE RECEIVED NO GROWTH TO DATE CULTURE WILL BE HELD FOR 5 DAYS BEFORE ISSUING A FINAL NEGATIVE REPORT     Performed at [facility]   Report Status PENDING   Incomplete  CULTURE, BLOOD (ROUTINE X 2)     Status: None   Collection Time     11/13/13  8:45 PM      Result Value Ref Range Status   Specimen Description BLOOD RIGHT HAND   Final   Special Requests BOTTLES DRAWN AEROBIC AND ANAEROBIC 5CC   Final   Culture  Setup Time     Final   Value: 11/13/2013 23:56     Performed at [facility]   Culture     Final   Value:        BLOOD CULTURE RECEIVED NO GROWTH TO DATE CULTURE WILL BE HELD FOR 5 DAYS BEFORE ISSUING A FINAL NEGATIVE REPORT     Performed at [facility]   Report Status PENDING   Incomplete  URINE CULTURE     Status: None   Collection Time    11/13/13 11:48 PM      Result Value Ref Range Status   Specimen Description URINE, CLEAN CATCH   Final   Special Requests NONE   Final   Culture  Setup Time     Final   Value: 11/14/2013 03:39     Performed at [facility]     Final   Value: NO GROWTH     Performed at [facility]   Culture     Final   Value: NO GROWTH     Performed at [facility]   Report Status 11/15/2013 FINAL   Final  CULTURE, EXPECTORATED SPUTUM-ASSESSMENT     Status: None   Collection Time    11/14/13  4:01 AM      Result Value Ref Range Status   Specimen Description SPU   Final   Special Requests NONE   Final   Sputum evaluation     Final   Value: THIS SPECIMEN IS ACCEPTABLE. RESPIRATORY CULTURE REPORT TO FOLLOW.   Report Status 11/14/2013 FINAL   Final  CULTURE, RESPIRATORY (NON-EXPECTORATED)     Status: None   Collection Time    11/14/13  4:01 AM      Result Value Ref Range Status   Specimen Description SPUTUM   Final   Special Requests NONE   Final   Gram Stain     Final   Value: ABUNDANT WBC PRESENT, PREDOMINANTLY PMN     FEW SQUAMOUS EPITHELIAL CELLS PRESENT     MODERATE GRAM POSITIVE COCCI IN PAIRS     IN CLUSTERS IN CHAINS FEW GRAM NEGATIVE RODS     FEW GRAM POSITIVE RODS   Culture     Final   Value: NORMAL OROPHARYNGEAL FLORA     Performed at [facility]   Report Status PENDING   Incomplete  Studies: No results found.  Scheduled Meds: . sodium chloride  1,000 mL Intravenous Once  . atorvastatin  10 mg Oral Daily  . azithromycin  500 mg Intravenous Q24H  . cefTRIAXone (ROCEPHIN)  IV  1 g Intravenous Q24H  . clonazePAM  1 mg Oral QHS  . divalproex  1,250 mg Oral QPM  . enoxaparin (LOVENOX) injection  40 mg Subcutaneous Q24H  . guaiFENesin  600 mg Oral BID  . ipratropium-albuterol  3 mL Nebulization Q4H  . levothyroxine  100 mcg Oral QAC breakfast  . methylPREDNISolone (SOLU-MEDROL) injection  60 mg Intravenous 3 times per day   Continuous Infusions:      Time spent: 25 minutes    Anacaren Kohan  [facility] Pager [phone]. If 7PM-7AM, please contact night-coverage at [email or web address], password [facility] 11/16/2013, 9:11 AM  LOS: 3 days

## 2013-11-17 ENCOUNTER — Inpatient Hospital Stay (HOSPITAL_COMMUNITY): Payer: Medicare Other

## 2013-11-17 DIAGNOSIS — J209 Acute bronchitis, unspecified: Secondary | ICD-10-CM | POA: Diagnosis present

## 2013-11-17 MED ORDER — PREDNISONE 50 MG PO TABS: 50.0000 mg | ORAL_TABLET | Freq: Every day | ORAL | Status: DC

## 2013-11-17 MED ORDER — ALBUTEROL SULFATE HFA 108 (90 BASE) MCG/ACT IN AERS: 2.0000 | INHALATION_SPRAY | Freq: Four times a day (QID) | RESPIRATORY_TRACT | Status: DC | PRN

## 2013-11-17 MED ORDER — LEVOFLOXACIN 750 MG PO TABS: 750.0000 mg | ORAL_TABLET | Freq: Every day | ORAL | Status: DC

## 2013-11-17 NOTE — Discharge Summary (Addendum)
Physician Discharge Summary  PATRICK SOHM KGY:[id number] DOB: 10-30-1934 DOA: 11/13/2013  PCP: Nyoka Cowden, MD  Admit date: 11/13/2013 Discharge date: 11/17/2013  Time spent: 40 minutes  Recommendations for Outpatient Follow-up:  outpt PCP follow up in 1 week  Discharge Diagnoses:  Principal Problem:   Acute respiratory failure with hypoxia  Active Problems:   Acute bronchitis/ bronchiolitis   HYPOTHYROIDISM   HYPERLIPIDEMIA   DEPRESSION   OSA on CPAP   Discharge Condition: fair  Diet recommendation: regular  Filed Weights   11/13/13 1945 11/13/13 2035 11/14/13 0126  Weight: 90.719 kg (200 lb) 86.183 kg (190 lb) 87.8 kg (193 lb 9 oz)    History of present illness:  Please refer to admission H&P for details, but in brief, 79 y/o female with HTN, hypothyroidism, dyslipidemia OSA, presented with acute severe bronchitis with hypoxic respiratory failure.    Hospital Course:  Acute bronchitis/ bronchiolitis  Placed on O2 via nasal cannula, maintaining sats on 2L via [place] .  ABG on admission showed mild hypoxia. elevated lactic acid on admission which has now resolved.  -Flu PCR negative.  -supportive care with Tylenol and antitussives. Started empiric IV Rocephin and azithromycin. Patient also placed on scheduled duonebs, prn albuterol nebs.given persistent wheezing and cough started IV solumedrol and switched to po prednisone with improvement. blood culture negative, sputum culture growing mixed bacteria , urine negative for strep and legionella antigen. -patient much improved today with no shortness of breath and has minimal cough. maintaining o2 sat on RA. Repeat CXR done this am given left basilar crackles and  is normal.   i will discharge her on oral Levaquin for 2 more days to complete a 7 day course. Will prescribe her albuterol inhaler and 5 more days of oral prednisone.  Obstructive sleep apnea  CPAP at bedtime   Hypothyroidism  Continue Synthroid    Depression and mood disorder  Continue Depakote    Diet : regular   Code Status: Full code  Family Communication: None at bedside  Disposition Plan: Home   Consultants:  None Procedures:  None Antibiotics:  IV Rocephin and azithromycin     Discharge Exam: Filed Vitals:   11/17/13 0737  BP:   Pulse: 74  Temp:   Resp: 18    General: elderly  female lying in bed in no acute distress  HEENT: No pallor, no icterus, moist oral mucosa,  Cardiovascular: Normal S1-S2, no murmurs or gallops  Respiratory: clear b/l, fine left  basilar crackles  Abdomen: Soft, nontender, nondistended, bowel sounds present  Extremities: Warm, no edema  CNS: AAO x3   Discharge Instructions   Future Appointments Provider Department Dept Phone   01/08/2014 9:30 AM Terrance Mass, MD [facility] [phone]       Medication List    STOP taking these medications       azithromycin 250 MG tablet  Commonly known as:  ZITHROMAX      TAKE these medications       albuterol 108 (90 BASE) MCG/ACT inhaler  Commonly known as:  PROAIR HFA  Inhale 2 puffs into the lungs every 6 (six) hours as needed for wheezing or shortness of breath.     atorvastatin 10 MG tablet  Commonly known as:  LIPITOR  Take 10 mg by mouth daily.     cholecalciferol 1000 UNITS tablet  Commonly known as:  VITAMIN D  Take 1,000 Units by mouth daily.     clonazePAM 1 MG tablet  Commonly known as:  KLONOPIN  Take 1 mg by mouth at bedtime.     divalproex 250 MG 24 hr tablet  Commonly known as:  DEPAKOTE ER  Take 1,250 mg by mouth every evening.     eszopiclone 2 MG Tabs tablet  Commonly known as:  LUNESTA  Take 2 mg by mouth at bedtime as needed for sleep. Take immediately before bedtime     guaiFENesin 600 MG 12 hr tablet  Commonly known as:  MUCINEX  Take 600 mg by mouth 2 (two) times daily as needed for to loosen phlegm.     HYDROcodone-homatropine 5-1.5 MG/5ML syrup  Commonly  known as:  HYCODAN  Take 5 mLs by mouth every 6 (six) hours as needed for cough.     levofloxacin 750 MG tablet  Commonly known as:  LEVAQUIN  Take 1 tablet (750 mg total) by mouth daily. Until 4/2 ( 2 more days)     levothyroxine 100 MCG tablet  Commonly known as:  SYNTHROID, LEVOTHROID  Take 100 mcg by mouth daily before breakfast.     predniSONE 50 MG tablet  Commonly known as:  DELTASONE  Take 1 tablet (50 mg total) by mouth daily with breakfast.       No Known Allergies     Follow-up Information   Follow up with Nyoka Cowden, MD In 1 week.   Specialty:  Internal Medicine   Contact information:   [place] [address] [phone]        The results of significant diagnostics from this hospitalization (including imaging, microbiology, ancillary and laboratory) are listed below for reference.    Significant Diagnostic Studies: Dg Chest 2 View  11/13/2013   CLINICAL DATA:  Fever, cough and congestion.  EXAM: CHEST  2 VIEW  COMPARISON:  None.  FINDINGS: Cardiac silhouette is mildly enlarged. Normal mediastinal and hilar contours.  Clear lungs.  No pleural effusion or pneumothorax.  The bony thorax is intact.  IMPRESSION: No acute cardiopulmonary disease.   Electronically Signed   By: Lajean Manes M.D.   On: 11/13/2013 21:33    Microbiology: Recent Results (from the past 240 hour(s))  CULTURE, BLOOD (ROUTINE X 2)     Status: None   Collection Time    11/13/13  8:39 PM      Result Value Ref Range Status   Specimen Description BLOOD RIGHT ARM   Final   Special Requests BOTTLES DRAWN AEROBIC AND ANAEROBIC 5CC   Final   Culture  Setup Time     Final   Value: 11/13/2013 23:56     Performed at [facility]   Culture     Final   Value:        BLOOD CULTURE RECEIVED NO GROWTH TO DATE CULTURE WILL BE HELD FOR 5 DAYS BEFORE ISSUING A FINAL NEGATIVE REPORT     Performed at [facility]   Report Status PENDING   Incomplete   CULTURE, BLOOD (ROUTINE X 2)     Status: None   Collection Time    11/13/13  8:45 PM      Result Value Ref Range Status   Specimen Description BLOOD RIGHT HAND   Final   Special Requests BOTTLES DRAWN AEROBIC AND ANAEROBIC 5CC   Final   Culture  Setup Time     Final   Value: 11/13/2013 23:56     Performed at [facility]   Culture     Final  Value:        BLOOD CULTURE RECEIVED NO GROWTH TO DATE CULTURE WILL BE HELD FOR 5 DAYS BEFORE ISSUING A FINAL NEGATIVE REPORT     Performed at [facility]   Report Status PENDING   Incomplete  URINE CULTURE     Status: None   Collection Time    11/13/13 11:48 PM      Result Value Ref Range Status   Specimen Description URINE, CLEAN CATCH   Final   Special Requests NONE   Final   Culture  Setup Time     Final   Value: 11/14/2013 03:39     Performed at [facility]     Final   Value: NO GROWTH     Performed at [facility]   Culture     Final   Value: NO GROWTH     Performed at [facility]   Report Status 11/15/2013 FINAL   Final  CULTURE, EXPECTORATED SPUTUM-ASSESSMENT     Status: None   Collection Time    11/14/13  4:01 AM      Result Value Ref Range Status   Specimen Description SPU   Final   Special Requests NONE   Final   Sputum evaluation     Final   Value: THIS SPECIMEN IS ACCEPTABLE. RESPIRATORY CULTURE REPORT TO FOLLOW.   Report Status 11/14/2013 FINAL   Final  CULTURE, RESPIRATORY (NON-EXPECTORATED)     Status: None   Collection Time    11/14/13  4:01 AM      Result Value Ref Range Status   Specimen Description SPUTUM   Final   Special Requests NONE   Final   Gram Stain     Final   Value: ABUNDANT WBC PRESENT, PREDOMINANTLY PMN     FEW SQUAMOUS EPITHELIAL CELLS PRESENT     MODERATE GRAM POSITIVE COCCI IN PAIRS     IN CLUSTERS IN CHAINS FEW GRAM NEGATIVE RODS     FEW GRAM POSITIVE RODS   Culture     Final   Value: NORMAL OROPHARYNGEAL FLORA     Performed at  [facility]   Report Status 11/16/2013 FINAL   Final     Labs: Basic Metabolic Panel:  Recent Labs Lab 11/13/13 2000 11/14/13 0403  NA 138 139  K 4.6 4.1  CL 97 101  CO2 25 24  GLUCOSE 126* 129*  BUN 29* 24*  CREATININE 1.03 0.85  CALCIUM 9.5 9.1   Liver Function Tests:  Recent Labs Lab 11/13/13 2000 11/14/13 0403  AST 25 23  ALT 21 18  ALKPHOS 50 43  BILITOT 0.5 0.4  PROT 7.6 6.9  ALBUMIN 3.3* 2.9*   No results found for this basename: LIPASE, AMYLASE,  in the last 168 hours No results found for this basename: AMMONIA,  in the last 168 hours CBC:  Recent Labs Lab 11/13/13 2000 11/14/13 0403  WBC 12.0* 9.6  NEUTROABS 9.2* 7.5  HGB 13.0 12.0  HCT 38.4 36.3  MCV 96.0 96.5  PLT 140* 131*   Cardiac Enzymes: No results found for this basename: CKTOTAL, CKMB, CKMBINDEX, TROPONINI,  in the last 168 hours BNP: BNP (last 3 results) No results found for this basename: PROBNP,  in the last 8760 hours CBG: No results found for this basename: GLUCAP,  in the last 168 hours     Signed:  Jalexis Breed, [place]  [facility] 11/17/2013, 11:42 AM

## 2013-11-19 LAB — CULTURE, BLOOD (ROUTINE X 2)
Culture: NO GROWTH
Culture: NO GROWTH

## 2013-11-23 ENCOUNTER — Ambulatory Visit (INDEPENDENT_AMBULATORY_CARE_PROVIDER_SITE_OTHER)
Admission: RE | Admit: 2013-11-23 | Discharge: 2013-11-23 | Disposition: A | Discharge status: Home / Self Care | Payer: Medicare Other | Source: Ambulatory Visit | Attending: Family | Admitting: Family

## 2013-11-23 ENCOUNTER — Encounter: Payer: Self-pay | Admitting: Family

## 2013-11-23 ENCOUNTER — Ambulatory Visit (INDEPENDENT_AMBULATORY_CARE_PROVIDER_SITE_OTHER): Payer: Medicare Other | Admitting: Family

## 2013-11-23 ENCOUNTER — Telehealth: Payer: Self-pay | Admitting: Pulmonary Disease

## 2013-11-23 VITALS — BP 110/52 | HR 100 | Temp 98.7°F | Wt 193.0 lb

## 2013-11-23 DIAGNOSIS — R413 Other amnesia: Secondary | ICD-10-CM

## 2013-11-23 DIAGNOSIS — R05 Cough: Secondary | ICD-10-CM

## 2013-11-23 DIAGNOSIS — J209 Acute bronchitis, unspecified: Secondary | ICD-10-CM

## 2013-11-23 DIAGNOSIS — R059 Cough, unspecified: Secondary | ICD-10-CM

## 2013-11-23 LAB — BASIC METABOLIC PANEL
BUN: 17 mg/dL (ref 6–23)
CALCIUM: 8.8 mg/dL (ref 8.4–10.5)
CO2: 34 mEq/L — ABNORMAL HIGH (ref 19–32)
Chloride: 102 mEq/L (ref 96–112)
Creatinine, Ser: 0.9 mg/dL (ref 0.4–1.2)
GFR: 62.16 mL/min (ref >60.00)
Glucose, Bld: 95 mg/dL (ref 70–99)
POTASSIUM: 4.5 meq/L (ref 3.5–5.1)
Sodium: 142 mEq/L (ref 135–145)

## 2013-11-23 LAB — CBC WITH DIFFERENTIAL/PLATELET
BASOS PCT: 0.1 % (ref 0.0–3.0)
Basophils Absolute: 0 10*3/uL (ref 0.0–0.1)
EOS ABS: 0.1 10*3/uL (ref 0.0–0.7)
Eosinophils Relative: 1.2 % (ref 0.0–5.0)
HEMATOCRIT: 37.4 % (ref 36.0–46.0)
Hemoglobin: 12.6 g/dL (ref 12.0–15.0)
LYMPHS ABS: 2.8 10*3/uL (ref 0.7–4.0)
Lymphocytes Relative: 23.9 % (ref 12.0–46.0)
MCHC: 33.6 g/dL (ref 30.0–36.0)
MCV: 97.1 fl (ref 78.0–100.0)
MONO ABS: 0.6 10*3/uL (ref 0.1–1.0)
Monocytes Relative: 5.5 % (ref 3.0–12.0)
NEUTROS PCT: 69.3 % (ref 43.0–77.0)
Neutro Abs: 8 10*3/uL — ABNORMAL HIGH (ref 1.4–7.7)
Platelets: 212 10*3/uL (ref 150.0–400.0)
RBC: 3.85 Mil/uL — AB (ref 3.87–5.11)
RDW: 14.5 % (ref 11.5–14.6)
WBC: 11.5 10*3/uL — ABNORMAL HIGH (ref 4.5–10.5)

## 2013-11-23 MED ORDER — BENZONATATE 200 MG PO CAPS: 200.0000 mg | ORAL_CAPSULE | Freq: Two times a day (BID) | ORAL | Status: DC | PRN

## 2013-11-23 NOTE — Progress Notes (Signed)
Subjective:    Patient ID: Emily Castaneda, female    DOB: 1935-04-19, 79 y.o.   MRN: [id number]  HPI 79 y.o. White female presents today for hospital follow up. Pt is accompanied by her husband on presentation. Pt states that on "wednesday, I could not breath and developed a fever and called to get a z-pack. Then I got worse so I went to the hospital (March 27)." In the hospital pt was diagnosed with acute bronchitis and respiratory failure, she was started on 2L O2 [place] and 5L O2 [place] at night and also given prednisone and Levofloxican and discharged on March 30th. Pt states that she "feels like I am getting a little bit better, but I still have so much secretions from my nose and chest and I am so weak". Pt describes the secretions as white and thick and states they are constant. She also acknowledges throat pain that she relates to coughing, describes the pain a sore, she has been taking "cough medicine" to help with cough but is currently out. Pt states that "I am still short of breath and weak, I have a CPAP at home but have not started using it yet". Pt was also given a prescription from hospital for Albuterol 2 puffs Q6 hours, husband states that "she is not using it that often, she forgets". Husband also expresses concern stating that "I feel like she has gotten worse she she came home and they discharged her to soon! She has so many secretions and she is not using her inhaler like she should." Pt states that she is drinking water, eating soup and fruits with an improving appetite and is urinating regularly. She denies fever, malaise, and unexpected weight change.     Review of Systems  Constitutional: Positive for activity change and fatigue.  HENT: Positive for congestion, rhinorrhea, sore throat and voice change.   Eyes: Negative.   Respiratory: Positive for cough and shortness of breath.   Cardiovascular: Negative.   Gastrointestinal: Negative.   Endocrine: Negative.   Genitourinary:  Negative.   Musculoskeletal: Negative.   Skin: Negative.   Allergic/Immunologic: Negative.   Neurological: Positive for weakness.  Hematological: Negative.   Psychiatric/Behavioral: Negative.      Past Medical History  Diagnosis Date  . Hx of colonic polyps   . Depression     Dr. Abner Greenspan  . Diverticulosis of colon   . Hyperlipidemia   . Hypothyroidism   . Menopausal syndrome   . OSA (obstructive sleep apnea)     cpap; 17 cm (clint young)  . Endometriosis   . Manic, depressive     History   Social History  . Marital Status: Married    Spouse Name: N/A    Number of Children: N/A  . Years of Education: N/A   Occupational History  . Not on file.   Social History Main Topics  . Smoking status: Former Smoker    Types: Cigarettes    Quit date: 08/21/1983  . Smokeless tobacco: Never Used     Comment: only smoked socially  . Alcohol Use: .5 - 1 oz/week    1-2 drink(s) per week     Comment: occ glass of wine   . Drug Use: No  . Sexual Activity: Not on file   Other Topics Concern  . Not on file   Social History Narrative  . No narrative on file    Past Surgical History  Procedure Laterality Date  . Polypectomy  Dr. Ubaldo Glassing  . Colonoscopy    . Appendectomy    . Tonsillectomy and adenoidectomy      Family History  Problem Relation Age of Onset  . Diabetes Mother   . Heart disease Father     complications of coronary heart disease  . Heart disease Other   . Cancer Other     BREAST AND LUNG   . Diabetes Brother   . Colon cancer Neg Hx   . Rectal cancer Neg Hx   . Stomach cancer Neg Hx   . Breast cancer Maternal Aunt   . Cancer Maternal Grandfather     LUNG  . Breast cancer Daughter     No Known Allergies  Current Outpatient Prescriptions on File Prior to Visit  Medication Sig Dispense Refill  . albuterol (PROAIR HFA) 108 (90 BASE) MCG/ACT inhaler Inhale 2 puffs into the lungs every 6 (six) hours as needed for wheezing or shortness of breath.  3  Inhaler  2  . atorvastatin (LIPITOR) 10 MG tablet Take 10 mg by mouth daily.      . cholecalciferol (VITAMIN D) 1000 UNITS tablet Take 1,000 Units by mouth daily.      . clonazePAM (KLONOPIN) 1 MG tablet Take 1 mg by mouth at bedtime.       . divalproex (DEPAKOTE ER) 250 MG 24 hr tablet Take 1,250 mg by mouth every evening.      . eszopiclone (LUNESTA) 2 MG TABS Take 2 mg by mouth at bedtime as needed for sleep. Take immediately before bedtime      . guaiFENesin (MUCINEX) 600 MG 12 hr tablet Take 600 mg by mouth 2 (two) times daily as needed for to loosen phlegm.      Marland Kitchen HYDROcodone-homatropine (HYCODAN) 5-1.5 MG/5ML syrup Take 5 mLs by mouth every 6 (six) hours as needed for cough.  120 mL  0  . levofloxacin (LEVAQUIN) 750 MG tablet Take 1 tablet (750 mg total) by mouth daily.  2 tablet  0  . levothyroxine (SYNTHROID, LEVOTHROID) 100 MCG tablet Take 100 mcg by mouth daily before breakfast.      . predniSONE (DELTASONE) 50 MG tablet Take 1 tablet (50 mg total) by mouth daily with breakfast.  5 tablet  0   No current facility-administered medications on file prior to visit.    BP 110/52  Pulse 100  Temp(Src) 98.7 F (37.1 C) (Oral)  Wt 193 lb (87.544 kg)  SpO2 94%chart    Objective:   Physical Exam  Constitutional: She is oriented to person, place, and time. She appears well-developed. She is active. She has a sickly appearance. She appears ill.  HENT:  Head: Normocephalic.  Right Ear: Tympanic membrane, external ear and ear canal normal.  Left Ear: Tympanic membrane, external ear and ear canal normal.  Nose: Rhinorrhea present.  Mouth/Throat: Uvula is midline, oropharynx is clear and moist and mucous membranes are normal.  Cardiovascular: Normal rate, regular rhythm and normal heart sounds.   Pulmonary/Chest: Effort normal. She has decreased breath sounds in the right lower field and the left lower field.  Abdominal: Soft. Normal appearance and bowel sounds are normal.  Neurological:  She is alert and oriented to person, place, and time. She exhibits abnormal muscle tone.  Generalized weakness.   Skin: Skin is warm, dry and intact.  Psychiatric: She has a normal mood and affect. Her behavior is normal. Judgment and thought content normal. Her speech is delayed. Cognition and memory are impaired. She exhibits  abnormal recent memory.          Assessment & Plan:  79 y.o. White female presents for hospital follow up.  - Acute bronchitis: continue hospital prescribed medications until completion.   - CMP, CBC with differential  - chest xray   - Pulmonology consult  - Tessalon 200mg  for cough   - Memory Loss- CMP to test for electrolyte imbalance.  - Consult Home health to assist with medication compliance and close monitoring of bronchitis.   - Education: Continue to drink at least 64 oz of fluid daily to stay well hydrated. Use CPAP at night. Use inhaler as prescribed for 2 puffs Q6 hours.  - Follow up: Pending labs, chest xray and pulmonology consult.

## 2013-11-23 NOTE — Progress Notes (Signed)
Pre visit review using our clinic review tool, if applicable. No additional management support is needed unless otherwise documented below in the visit note. 

## 2013-11-23 NOTE — Patient Instructions (Signed)

## 2013-11-23 NOTE — Telephone Encounter (Signed)
There are only 2 providers in the office tomorrow that are accepting new pts LMOM TCB x1 for [facility]

## 2013-11-25 ENCOUNTER — Encounter: Payer: Self-pay | Admitting: Critical Care Medicine

## 2013-11-25 ENCOUNTER — Ambulatory Visit (INDEPENDENT_AMBULATORY_CARE_PROVIDER_SITE_OTHER): Payer: Medicare Other | Admitting: Critical Care Medicine

## 2013-11-25 ENCOUNTER — Telehealth: Payer: Self-pay | Admitting: Internal Medicine

## 2013-11-25 VITALS — BP 118/70 | HR 98 | Temp 98.4°F | Ht 62.0 in | Wt 188.8 lb

## 2013-11-25 DIAGNOSIS — Z9181 History of falling: Secondary | ICD-10-CM

## 2013-11-25 DIAGNOSIS — J218 Acute bronchiolitis due to other specified organisms: Secondary | ICD-10-CM

## 2013-11-25 DIAGNOSIS — J219 Acute bronchiolitis, unspecified: Secondary | ICD-10-CM

## 2013-11-25 DIAGNOSIS — J209 Acute bronchitis, unspecified: Secondary | ICD-10-CM

## 2013-11-25 DIAGNOSIS — R5381 Other malaise: Secondary | ICD-10-CM

## 2013-11-25 NOTE — Progress Notes (Signed)
Subjective:    Patient ID: Emily Castaneda, female    DOB: 1934-12-13, 79 y.o.   MRN: [id number]  HPI Comments: Chronic bronchitis/cough :  URI two weeks. Then worse and finally admitted via ED Cough is now better vs in hosp  Cough This is a new problem. The current episode started more than 1 month ago. The problem has been gradually improving. The cough is non-productive (was productive at first , clear ). Associated symptoms include chest pain, a fever, postnasal drip, rhinorrhea, a sore throat, shortness of breath and wheezing. Pertinent negatives include no chills, ear congestion, ear pain, headaches, heartburn, hemoptysis, myalgias, nasal congestion, rash, sweats or weight loss. Associated symptoms comments: Notes hoarseness No belching burping or clearing of throat. Risk factors for lung disease include smoking/tobacco exposure (social smoker only , passive smoke exposure excess). She has tried a beta-agonist inhaler for the symptoms. The treatment provided moderate relief. Her past medical history is significant for bronchitis. There is no history of asthma, bronchiectasis, COPD, emphysema, environmental allergies or pneumonia.   Past Medical History  Diagnosis Date  . Hx of colonic polyps   . Depression     Dr. Abner Greenspan  . Diverticulosis of colon   . Hyperlipidemia   . Hypothyroidism   . Menopausal syndrome   . OSA (obstructive sleep apnea)     cpap; 17 cm (clint young)  . Endometriosis   . Manic, depressive      Family History  Problem Relation Age of Onset  . Diabetes Mother   . Heart disease Father     complications of coronary heart disease  . Heart disease Other   . Cancer Other     BREAST AND LUNG   . Diabetes Brother   . Colon cancer Neg Hx   . Rectal cancer Neg Hx   . Stomach cancer Neg Hx   . Breast cancer Maternal Aunt   . Cancer Maternal Grandfather     LUNG  . Breast cancer Daughter      History   Social History  . Marital Status: Married    Spouse  Name: N/A    Number of Children: N/A  . Years of Education: N/A   Occupational History  . retired    Social History Main Topics  . Smoking status: Former Smoker -- 0.10 packs/day for 10 years    Types: Cigarettes    Quit date: 08/21/1983  . Smokeless tobacco: Never Used     Comment: only smoked socially  . Alcohol Use: 0.5 - 1.0 oz/week    1-2 drink(s) per week     Comment: occ glass of wine   . Drug Use: No  . Sexual Activity: Not on file   Other Topics Concern  . Not on file   Social History Narrative  . No narrative on file     No Known Allergies   Outpatient Prescriptions Prior to Visit  Medication Sig Dispense Refill  . albuterol (PROAIR HFA) 108 (90 BASE) MCG/ACT inhaler Inhale 2 puffs into the lungs every 6 (six) hours as needed for wheezing or shortness of breath.  3 Inhaler  2  . atorvastatin (LIPITOR) 10 MG tablet Take 10 mg by mouth daily.      . benzonatate (TESSALON) 200 MG capsule Take 1 capsule (200 mg total) by mouth 2 (two) times daily as needed for cough.  20 capsule  0  . cholecalciferol (VITAMIN D) 1000 UNITS tablet Take 1,000 Units by mouth daily.      Marland Kitchen  clonazePAM (KLONOPIN) 1 MG tablet Take 1 mg by mouth at bedtime.       . divalproex (DEPAKOTE ER) 250 MG 24 hr tablet Take 1,250 mg by mouth every evening.      . eszopiclone (LUNESTA) 2 MG TABS Take 2 mg by mouth at bedtime as needed for sleep. Take immediately before bedtime      . guaiFENesin (MUCINEX) 600 MG 12 hr tablet Take 600 mg by mouth 2 (two) times daily as needed for to loosen phlegm.      Marland Kitchen HYDROcodone-homatropine (HYCODAN) 5-1.5 MG/5ML syrup Take 5 mLs by mouth every 6 (six) hours as needed for cough.  120 mL  0  . levothyroxine (SYNTHROID, LEVOTHROID) 100 MCG tablet Take 100 mcg by mouth daily before breakfast.      . predniSONE (DELTASONE) 50 MG tablet Take 1 tablet (50 mg total) by mouth daily with breakfast.  5 tablet  0  . levofloxacin (LEVAQUIN) 750 MG tablet Take 1 tablet (750 mg  total) by mouth daily.  2 tablet  0   No facility-administered medications prior to visit.       Review of Systems  Constitutional: Positive for fever, activity change, appetite change and fatigue. Negative for chills, weight loss, diaphoresis and unexpected weight change.  HENT: Positive for congestion, postnasal drip, rhinorrhea and sore throat. Negative for dental problem, ear discharge, ear pain, facial swelling, hearing loss, mouth sores, nosebleeds, sinus pressure, sneezing, tinnitus, trouble swallowing and voice change.   Eyes: Positive for discharge. Negative for photophobia, itching and visual disturbance.  Respiratory: Positive for cough, shortness of breath and wheezing. Negative for apnea, hemoptysis, choking, chest tightness and stridor.   Cardiovascular: Positive for chest pain. Negative for palpitations and leg swelling.  Gastrointestinal: Negative for heartburn, nausea, vomiting, abdominal pain, constipation, blood in stool and abdominal distention.  Genitourinary: Negative for dysuria, urgency, frequency, hematuria, flank pain, decreased urine volume and difficulty urinating.  Musculoskeletal: Negative for arthralgias, back pain, gait problem, joint swelling, myalgias, neck pain and neck stiffness.  Skin: Negative for color change, pallor and rash.  Allergic/Immunologic: Negative for environmental allergies.  Neurological: Positive for dizziness. Negative for tremors, seizures, syncope, speech difficulty, weakness, light-headedness, numbness and headaches.  Hematological: Negative for adenopathy. Does not bruise/bleed easily.  Psychiatric/Behavioral: Negative for confusion, sleep disturbance and agitation. The patient is not nervous/anxious.        Objective:   Physical Exam Filed Vitals:   11/25/13 1156  BP: 118/70  Pulse: 98  Temp: 98.4 F (36.9 C)  TempSrc: Oral  Height: 5\' 2"  (1.575 m)  Weight: 188 lb 12.8 oz (85.639 kg)  SpO2: 94%    Gen: Pleasant,  well-nourished, in no distress,  normal affect  ENT: No lesions,  mouth clear,  oropharynx clear, no postnasal drip  Neck: No JVD, no TMG, no carotid bruits  Lungs: No use of accessory muscles, no dullness to percussion, clear without rales or rhonchi  Cardiovascular: RRR, heart sounds normal, no murmur or gallops, no peripheral edema  Abdomen: soft and NT, no HSM,  BS normal  Musculoskeletal: No deformities, no cyanosis or clubbing  Neuro severe imbalance, high fall risk  Skin: Warm, no lesions or rashes  No results found.        Assessment & Plan:   Acute bronchiolitis Post viral bronchiolitis now resolving. 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 Stop Prednisone No further antibiotics  Use benzonatate as needed for cough Follow up in 3-4 weeks  At high risk for falls High fall risk from debilitation PT eval   Updated Medication List Outpatient Encounter Prescriptions as of 11/25/2013  Medication Sig  . albuterol (PROAIR HFA) 108 (90 BASE) MCG/ACT inhaler Inhale 2 puffs into the lungs every 6 (six) hours as needed for wheezing or shortness of breath.  Marland Kitchen atorvastatin (LIPITOR) 10 MG tablet Take 10 mg by mouth daily.  . benzonatate (TESSALON) 200 MG capsule Take 1 capsule (200 mg total) by mouth 2 (two) times daily as needed for cough.  . cholecalciferol (VITAMIN D) 1000 UNITS tablet Take 1,000 Units by mouth daily.  . clonazePAM (KLONOPIN) 1 MG tablet Take 1 mg by mouth at bedtime.   . divalproex (DEPAKOTE ER) 250 MG 24 hr tablet Take 1,250 mg by mouth every evening.  . eszopiclone (LUNESTA) 2 MG TABS Take 2 mg by mouth at bedtime as needed for sleep. Take immediately before bedtime  . guaiFENesin (MUCINEX) 600 MG 12 hr tablet Take 600 mg by mouth 2 (two) times daily as needed for to loosen phlegm.  Marland Kitchen HYDROcodone-homatropine  (HYCODAN) 5-1.5 MG/5ML syrup Take 5 mLs by mouth every 6 (six) hours as needed for cough.  . levothyroxine (SYNTHROID, LEVOTHROID) 100 MCG tablet Take 100 mcg by mouth daily before breakfast.  . predniSONE (DELTASONE) 50 MG tablet Take 50 mg by mouth daily with breakfast.  . [DISCONTINUED] predniSONE (DELTASONE) 50 MG tablet Take 1 tablet (50 mg total) by mouth daily with breakfast.  . [DISCONTINUED] levofloxacin (LEVAQUIN) 750 MG tablet Take 1 tablet (750 mg total) by mouth daily.

## 2013-11-25 NOTE — Patient Instructions (Addendum)
We will refer you to Home Physical Therapy. Stop Prednisone You do not need anymore antibiotics  Use benzonatate as needed for cough Follow up with Dr. Joya Gaskins in 3-4 weeks

## 2013-11-25 NOTE — Telephone Encounter (Signed)
I spoke with Bonnita Nasuti and she states the pt has COPD and has been having increased SOB and cough and is not taking her medications properly. Dr. Megan Salon is asking for an appt asap. Pt scheduled to see Dr. Joya Gaskins today at 11:45am. Pt is aware of appt.  Williston Bing, CMA

## 2013-11-25 NOTE — Telephone Encounter (Signed)
Pt's daughter is calling in regards to pt requesting home care visits, pt states they spoke with dr. Raliegh Ip about it and wasn't sure if it has been set up yet. Daughter would like to know if its been set up yet. You can reach the daughter at [phone] .

## 2013-11-25 NOTE — Telephone Encounter (Signed)
Called Judson Roch back told her referral has been sent for [facility] and someone should get in touch with her. Sarah verbalized understanding.

## 2013-11-26 DIAGNOSIS — Z9181 History of falling: Secondary | ICD-10-CM | POA: Insufficient documentation

## 2013-11-26 NOTE — Assessment & Plan Note (Signed)
Post viral bronchiolitis now resolving. 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 Stop Prednisone No further antibiotics  Use benzonatate as needed for cough Follow up in 3-4 weeks

## 2013-11-26 NOTE — Assessment & Plan Note (Signed)
High fall risk from debilitation PT eval

## 2013-12-01 ENCOUNTER — Telehealth: Payer: Self-pay | Admitting: Internal Medicine

## 2013-12-01 DIAGNOSIS — J219 Acute bronchiolitis, unspecified: Secondary | ICD-10-CM

## 2013-12-01 DIAGNOSIS — Z9181 History of falling: Secondary | ICD-10-CM

## 2013-12-01 NOTE — Telephone Encounter (Signed)
Pt husband called and want Dr Burnice Logan to call him concerning his wife . Husband stated wife is having problem sleeping and states she can not use the c pack machine

## 2013-12-01 NOTE — Telephone Encounter (Signed)
Order sent.

## 2013-12-01 NOTE — Telephone Encounter (Signed)
Please set up for home physical therapy

## 2013-12-02 ENCOUNTER — Institutional Professional Consult (permissible substitution): Payer: Medicare Other | Admitting: Internal Medicine

## 2013-12-08 ENCOUNTER — Telehealth: Payer: Self-pay | Admitting: Internal Medicine

## 2013-12-08 NOTE — Telephone Encounter (Signed)
agree

## 2013-12-08 NOTE — Telephone Encounter (Signed)
[facility] home health nurse Gilberto Better) called to inform Dr Raliegh Ip that he has evaluated patient for home health therapy.  He will be seeing patient 1 time for 1 week then 2 x a week for 3 weeks. Please call him if there are any concerns, questions. Thanks [phone]

## 2013-12-08 NOTE — Telephone Encounter (Signed)
FYI

## 2013-12-15 ENCOUNTER — Telehealth: Payer: Self-pay | Admitting: Internal Medicine

## 2013-12-15 NOTE — Telephone Encounter (Signed)
Left message on voicemail to call office.  

## 2013-12-15 NOTE — Telephone Encounter (Signed)
Pt called back told her she needs to contact Pulmonary Dr. Joya Gaskins his office ordered [place]. Pt verbalized understanding.

## 2013-12-15 NOTE — Telephone Encounter (Signed)
Patient states she is doing fine and wants PCP to discontinue order for Home Health as she no longer needs nurse to come to her home.

## 2013-12-21 DIAGNOSIS — R269 Unspecified abnormalities of gait and mobility: Secondary | ICD-10-CM

## 2013-12-21 DIAGNOSIS — Z9181 History of falling: Secondary | ICD-10-CM

## 2013-12-21 DIAGNOSIS — J218 Acute bronchiolitis due to other specified organisms: Secondary | ICD-10-CM

## 2013-12-23 ENCOUNTER — Ambulatory Visit (INDEPENDENT_AMBULATORY_CARE_PROVIDER_SITE_OTHER): Payer: Medicare Other | Admitting: Critical Care Medicine

## 2013-12-23 ENCOUNTER — Encounter: Payer: Self-pay | Admitting: Critical Care Medicine

## 2013-12-23 VITALS — BP 112/86 | HR 120 | Temp 97.2°F | Ht 62.0 in | Wt 196.0 lb

## 2013-12-23 DIAGNOSIS — J219 Acute bronchiolitis, unspecified: Secondary | ICD-10-CM

## 2013-12-23 DIAGNOSIS — J218 Acute bronchiolitis due to other specified organisms: Secondary | ICD-10-CM

## 2013-12-23 NOTE — Progress Notes (Signed)
Subjective:    Patient ID: Emily Castaneda, female    DOB: 01-30-1935, 79 y.o.   MRN: [id number]  HPI Comments: Chronic bronchitis/cough :  URI two weeks. Then worse and finally admitted via ED Cough is now better vs in hosp   12/23/2013 Chief Complaint  Patient presents with  . 1 month follow up    Symptoms have resolved.  No SOB, wheezing, chest tightness/pain, or cough at this time.    All symptoms resolved Pt denies any significant sore throat, nasal congestion or excess secretions, fever, chills, sweats, unintended weight loss, pleurtic or exertional chest pain, orthopnea PND, or leg swelling Pt denies any increase in rescue therapy over baseline, denies waking up needing it or having any early am or nocturnal exacerbations of coughing/wheezing/or dyspnea. Pt also denies any obvious fluctuation in symptoms with  weather or environmental change or other alleviating or aggravating factors   Review of Systems  Constitutional: Positive for activity change, appetite change and fatigue. Negative for diaphoresis and unexpected weight change.  HENT: Positive for congestion. Negative for dental problem, ear discharge, facial swelling, hearing loss, mouth sores, nosebleeds, sinus pressure, sneezing, tinnitus, trouble swallowing and voice change.   Eyes: Positive for discharge. Negative for photophobia, itching and visual disturbance.  Respiratory: Negative for apnea, choking, chest tightness and stridor.   Cardiovascular: Negative for palpitations and leg swelling.  Gastrointestinal: Negative for nausea, vomiting, abdominal pain, constipation, blood in stool and abdominal distention.  Genitourinary: Negative for dysuria, urgency, frequency, hematuria, flank pain, decreased urine volume and difficulty urinating.  Musculoskeletal: Negative for arthralgias, back pain, gait problem, joint swelling, neck pain and neck stiffness.  Skin: Negative for color change and pallor.  Neurological: Negative  for dizziness, tremors, seizures, syncope, speech difficulty, weakness, light-headedness and numbness.  Hematological: Negative for adenopathy. Does not bruise/bleed easily.  Psychiatric/Behavioral: Negative for confusion, sleep disturbance and agitation. The patient is not nervous/anxious.        Objective:   Physical Exam  Filed Vitals:   12/23/13 1041  BP: 112/86  Pulse: 120  Temp: 97.2 F (36.2 C)  TempSrc: Oral  Height: 5\' 2"  (1.575 m)  Weight: 196 lb (88.905 kg)  SpO2: 97%    Gen: Pleasant, well-nourished, in no distress,  normal affect  ENT: No lesions,  mouth clear,  oropharynx clear, no postnasal drip  Neck: No JVD, no TMG, no carotid bruits  Lungs: No use of accessory muscles, no dullness to percussion, clear without rales or rhonchi  Cardiovascular: RRR, heart sounds normal, no murmur or gallops, no peripheral edema  Abdomen: soft and NT, no HSM,  BS normal  Musculoskeletal: No deformities, no cyanosis or clubbing  Neuro severe imbalance, high fall risk  Skin: Warm, no lesions or rashes  No results found.        Assessment & Plan:   Acute bronchiolitis Acute bronchiolitis with associated lower airway inflammation now resolved No additional antibiotic therapy indicated    Updated Medication List Outpatient Encounter Prescriptions as of 12/23/2013  Medication Sig  . atorvastatin (LIPITOR) 10 MG tablet Take 10 mg by mouth daily.  . cholecalciferol (VITAMIN D) 1000 UNITS tablet Take 1,000 Units by mouth daily.  . clonazePAM (KLONOPIN) 1 MG tablet Take 1 mg by mouth at bedtime.   . divalproex (DEPAKOTE ER) 250 MG 24 hr tablet Take 1,250 mg by mouth every evening.  . eszopiclone (LUNESTA) 2 MG TABS Take 2 mg by mouth at bedtime as needed for sleep. Take immediately  before bedtime  . levothyroxine (SYNTHROID, LEVOTHROID) 100 MCG tablet Take 100 mcg by mouth daily before breakfast.  . [DISCONTINUED] albuterol (PROAIR HFA) 108 (90 BASE) MCG/ACT inhaler  Inhale 2 puffs into the lungs every 6 (six) hours as needed for wheezing or shortness of breath.  . [DISCONTINUED] benzonatate (TESSALON) 200 MG capsule Take 1 capsule (200 mg total) by mouth 2 (two) times daily as needed for cough.  . [DISCONTINUED] guaiFENesin (MUCINEX) 600 MG 12 hr tablet Take 600 mg by mouth 2 (two) times daily as needed for to loosen phlegm.  . [DISCONTINUED] HYDROcodone-homatropine (HYCODAN) 5-1.5 MG/5ML syrup Take 5 mLs by mouth every 6 (six) hours as needed for cough.

## 2013-12-23 NOTE — Patient Instructions (Signed)
Stop all cough meds Return as needed

## 2013-12-24 NOTE — Assessment & Plan Note (Signed)
Acute bronchiolitis with associated lower airway inflammation now resolved No additional antibiotic therapy indicated

## 2014-01-08 ENCOUNTER — Encounter: Payer: Medicare Other | Admitting: Gynecology

## 2014-01-19 ENCOUNTER — Encounter: Payer: Self-pay | Admitting: Gynecology

## 2014-01-19 ENCOUNTER — Ambulatory Visit (INDEPENDENT_AMBULATORY_CARE_PROVIDER_SITE_OTHER): Payer: Medicare Other | Admitting: Gynecology

## 2014-01-19 VITALS — BP 124/82 | Ht 61.75 in | Wt 195.0 lb

## 2014-01-19 DIAGNOSIS — N898 Other specified noninflammatory disorders of vagina: Secondary | ICD-10-CM

## 2014-01-19 DIAGNOSIS — N959 Unspecified menopausal and perimenopausal disorder: Secondary | ICD-10-CM

## 2014-01-19 DIAGNOSIS — N952 Postmenopausal atrophic vaginitis: Secondary | ICD-10-CM

## 2014-01-19 LAB — WET PREP FOR TRICH, YEAST, CLUE
Clue Cells Wet Prep HPF POC: NONE SEEN
Trich, Wet Prep: NONE SEEN
WBC, Wet Prep HPF POC: NONE SEEN
Yeast Wet Prep HPF POC: NONE SEEN

## 2014-01-19 NOTE — Patient Instructions (Signed)

## 2014-01-19 NOTE — Progress Notes (Signed)
Emily Castaneda 11/01/1934 [id number]   History:    79 y.o.  for GYN exam with a set at times she feels vaginal moisture but no true urinary incontinence. The patient was.a former patient of Dr. Rexford Maus and we began to see her as a new patient in 2014. Review of her records indicated the following:  resectoscopic polypectomy 1999 benign endometrial polyp no hyperplasia or malignancy identified   Resectoscopic polypectomy also in 1989  CIN-1 treated with LEEP excision several years ago  Endometrial biopsy benign February 2012 for postmenopausal bleeding. No further bleeding reported. . Patient did have her shingles vaccine last year.   Patient has history of bipolar disorder. Her primary physician is Dr. Jenetta Downer who has been treating her for dyslipidemia. Patient had her T. Vaccine and her Pneumovax vaccine. Patient has had normal Pap smear for the past 3 years     Past medical history,surgical history, family history and social history were all reviewed and documented in the EPIC chart.  Gynecologic History No LMP recorded. Patient is postmenopausal. Contraception: post menopausal status Last Pap: 2014. Results were: normal Last mammogram: 2014. Results were: normal  Obstetric History OB History  Gravida Para Term Preterm AB SAB TAB Ectopic Multiple Living  3 3        3     # Outcome Date GA Lbr Len/2nd Weight Sex Delivery Anes PTL Lv  3 PAR           2 PAR           1 PAR                ROS: A ROS was performed and pertinent positives and negatives are included in the history.  GENERAL: No fevers or chills. HEENT: No change in vision, no earache, sore throat or sinus congestion. NECK: No pain or stiffness. CARDIOVASCULAR: No chest pain or pressure. No palpitations. PULMONARY: No shortness of breath, cough or wheeze. GASTROINTESTINAL: No abdominal pain, nausea, vomiting or diarrhea, melena or bright red blood per rectum. GENITOURINARY: No urinary frequency,  urgency, hesitancy or dysuria. MUSCULOSKELETAL: No joint or muscle pain, no back pain, no recent trauma. DERMATOLOGIC: No rash, no itching, no lesions. ENDOCRINE: No polyuria, polydipsia, no heat or cold intolerance. No recent change in weight. HEMATOLOGICAL: No anemia or easy bruising or bleeding. NEUROLOGIC: No headache, seizures, numbness, tingling or weakness. PSYCHIATRIC: No depression, no loss of interest in normal activity or change in sleep pattern.     Exam: chaperone present  BP 124/82  Ht 5' 1.75" (1.568 m)  Wt 195 lb (88.451 kg)  BMI 35.98 kg/m2  Body mass index is 35.98 kg/(m^2).  General appearance : Well developed well nourished female. No acute distress HEENT: Neck supple, trachea midline, no carotid bruits, no thyroidmegaly Lungs: Clear to auscultation, no rhonchi or wheezes, or rib retractions  Heart: Regular rate and rhythm, no murmurs or gallops Breast:Examined in sitting and supine position were symmetrical in appearance, no palpable masses or tenderness,  no skin retraction, no nipple inversion, no nipple discharge, no skin discoloration, no axillary or supraclavicular lymphadenopathy Abdomen: no palpable masses or tenderness, no rebound or guarding Extremities: no edema or skin discoloration or tenderness  Pelvic:  Bartholin, Urethra, Skene Glands: Within normal limits             Vagina: No gross lesions or discharge  Cervix: No gross lesions or discharge  Uterus   axial, normal size, shape and consistency, non-tender and  mobile  Adnexa  Without masses or tenderness  Anus and perineum  normal   Rectovaginal  normal sphincter tone without palpated masses or tenderness             Hemoccult PCP we'll provide   Wet prep negative  Assessment/Plan:  79 y.o. female with nonspecific watery discharge at times. Patient would know prior hysterectomy. Patient with no other symptoms. Has had past history of postmenopausal bleeding but no bleeding reported over the past  year. We are going to schedule an ultrasound for better assessment of her adnexa and her uterus in the next couple weeks. Her PCP will be drawn her blood work. She would be her colonoscopy next year she has history of benign polyps. No Pap smear done today in accordance to the new guidelines. She had a normal bone density in 2014 1 on the wound until next year. We discussed importance of calcium vitamin D and regular exercise for osteoporosis prevention.  Note: This dictation was prepared with  Dragon/digital dictation along withSmart phrase technology. Any transcriptional errors that result from this process are unintentional.   Terrance Mass MD, 2:47 PM 01/19/2014

## 2014-03-04 ENCOUNTER — Telehealth: Payer: Self-pay | Admitting: Internal Medicine

## 2014-03-04 ENCOUNTER — Ambulatory Visit (INDEPENDENT_AMBULATORY_CARE_PROVIDER_SITE_OTHER): Payer: Medicare Other | Admitting: Family Medicine

## 2014-03-04 ENCOUNTER — Encounter: Payer: Self-pay | Admitting: Family Medicine

## 2014-03-04 VITALS — BP 124/78 | HR 89 | Wt 198.0 lb

## 2014-03-04 DIAGNOSIS — E039 Hypothyroidism, unspecified: Secondary | ICD-10-CM

## 2014-03-04 DIAGNOSIS — L659 Nonscarring hair loss, unspecified: Secondary | ICD-10-CM

## 2014-03-04 LAB — TSH: TSH: 1.79 u[IU]/mL (ref 0.35–4.50)

## 2014-03-04 NOTE — Telephone Encounter (Signed)
Patient Information:  Caller Name: Amandy  Phone: [phone]  Patient: Emily Castaneda  Gender: Female  DOB: 09/23/1934  Age: 79 Years  PCP: Bluford Kaufmann (Family Practice > 7yrs old)  Office Follow Up:  Does the office need to follow up with this patient?: No  Instructions For The Office: N/A  RN Note:  All emergent sxs ruled out as per Hair Loss protocol with exception to 'Difuse Hair loss and history of thyroid disorder or on thyroid medication.'   Advised pt of need for appt, pt agreed.  Symptoms  Reason For Call & Symptoms: Pt states she has had generalized hair loss x 2 weeks.  Handfuls of hair will come out with washing or brushing hair.   Pt does have hx of hypothyroidism and pharmacist recommended pt needed to see MD.  Reviewed Health History In EMR: Yes  Reviewed Medications In EMR: Yes  Reviewed Allergies In EMR: Yes  Reviewed Surgeries / Procedures: Yes  Date of Onset of Symptoms: 02/18/2014  Guideline(s) Used:  No Protocol Available - Sick Adult  Disposition Per Guideline:   See Within 2 Weeks in Office  Reason For Disposition Reached:   Nursing judgment  Advice Given:  N/A  Patient Will Follow Care Advice:  YES  Appointment Scheduled:  03/04/2014 15:15:00 Appointment Scheduled Provider:  Carolann Littler (Family Practice)

## 2014-03-04 NOTE — Telephone Encounter (Signed)
Noted  

## 2014-03-04 NOTE — Progress Notes (Signed)
Pre visit review using our clinic review tool, if applicable. No additional management support is needed unless otherwise documented below in the visit note. 

## 2014-03-04 NOTE — Progress Notes (Signed)
   Subjective:    Patient ID: Emily Castaneda, female    DOB: [birth year], 79 y.o.   MRN: [id number]  HPI Patient seen as a work in with 2 week history of diffuse hair loss. She does not have her hair under any traction. She has hypothyroidism which is treated with levothyroxin with normal TSH 5 months ago. Compliant with therapy. She is using some type of shampoo that she states burns her skin when it runs down her back. She has not noticed any redness of her scalp or skin rashes. Her hair loss is greatest when she is washing her hair.  Past Medical History  Diagnosis Date  . Hx of colonic polyps   . Depression     Dr. Abner Greenspan  . Diverticulosis of colon   . Hyperlipidemia   . Hypothyroidism   . Menopausal syndrome   . OSA (obstructive sleep apnea)     cpap; 17 cm (clint young)  . Endometriosis   . Manic, depressive    Past Surgical History  Procedure Laterality Date  . Polypectomy      Dr. Ubaldo Glassing  . Colonoscopy    . Appendectomy    . Tonsillectomy and adenoidectomy      reports that she quit smoking about 30 years ago. Her smoking use included Cigarettes. She has a 1 pack-year smoking history. She has never used smokeless tobacco. She reports that she drinks about .5 - 1 ounces of alcohol per week. She reports that she does not use illicit drugs. family history includes Breast cancer in her daughter and maternal aunt; Cancer in her maternal grandfather and other; Diabetes in her brother and mother; Heart disease in her father and other. There is no history of Colon cancer, Rectal cancer, or Stomach cancer. No Known Allergies    Review of Systems  Constitutional: Negative for fever.  Skin: Negative for rash.       Objective:   Physical Exam  Constitutional: She appears well-developed and well-nourished.  Cardiovascular: Normal rate.   Pulmonary/Chest: Effort normal and breath sounds normal. No respiratory distress. She has no wheezes. She has no rales.  Skin:  Patient has  generally thin hair throughout but no localized alopecia. No evidence for broken hair shafts. No skin rash visible.          Assessment & Plan:  Generalized alopecia. She will avoid her current shampoo just in case this is irritating. Recheck TSH. If normal, consider evaluation with dermatologist

## 2014-04-19 ENCOUNTER — Encounter: Payer: Self-pay | Admitting: Pulmonary Disease

## 2014-04-19 ENCOUNTER — Ambulatory Visit (INDEPENDENT_AMBULATORY_CARE_PROVIDER_SITE_OTHER): Payer: Medicare Other | Admitting: Pulmonary Disease

## 2014-04-19 VITALS — BP 122/60 | HR 88 | Temp 98.0°F | Ht 62.0 in | Wt 196.8 lb

## 2014-04-19 DIAGNOSIS — G4733 Obstructive sleep apnea (adult) (pediatric): Secondary | ICD-10-CM

## 2014-04-19 DIAGNOSIS — Z9989 Dependence on other enabling machines and devices: Principal | ICD-10-CM

## 2014-04-19 NOTE — Assessment & Plan Note (Signed)
Download will be checked CPAP supplies will be renewed x 1 year  Weight loss encouraged, compliance with goal of at least 4-6 hrs every night is the expectation. Advised against medications with sedative side effects Cautioned against driving when sleepy - understanding that sleepiness will vary on a day to day basis

## 2014-04-19 NOTE — Patient Instructions (Signed)
Download will be checked CPAP supplies will be renewed x 1 year

## 2014-04-19 NOTE — Progress Notes (Signed)
   Subjective:    Patient ID: Emily Castaneda, female    DOB: 28-Oct-1934, 79 y.o.   MRN: [id number]  HPI PCP - George    79/F, bipolar disorder , hypothyroidism presents for management of OSA  PSG 8/05 showed severe OSA with RDI 75/h corrected by CPAP 17 cm, she used other interfaces before settling on nasal pillows.    EPisode of bronchitis in 12/2013 - now resolved Takes klonopin at bedtime, occ takes lunesta  Bedtime 11pm, latency 20-30 mins, few nocturnal awakenings, no nocturia, oob at 0700, rested , no dry mouth, no headaches  She is compliant with CPAP with nasal pillows -but needs new supplies She denies excessive daytime somnolence , dryness, pressure is ok    Review of Systems neg for any significant sore throat, dysphagia, itching, sneezing, nasal congestion or excess/ purulent secretions, fever, chills, sweats, unintended wt loss, pleuritic or exertional cp, hempoptysis, orthopnea pnd or change in chronic leg swelling. Also denies presyncope, palpitations, heartburn, abdominal pain, nausea, vomiting, diarrhea or change in bowel or urinary habits, dysuria,hematuria, rash, arthralgias, visual complaints, headache, numbness weakness or ataxia.     Objective:   Physical Exam  Gen. Pleasant, obese, in no distress ENT - no lesions, no post nasal drip Neck: No JVD, no thyromegaly, no carotid bruits Lungs: no use of accessory muscles, no dullness to percussion, decreased without rales or rhonchi  Cardiovascular: Rhythm regular, heart sounds  normal, no murmurs or gallops, no peripheral edema Musculoskeletal: No deformities, no cyanosis or clubbing , no tremors       Assessment & Plan:

## 2014-04-29 ENCOUNTER — Telehealth: Payer: Self-pay | Admitting: Pulmonary Disease

## 2014-04-29 NOTE — Telephone Encounter (Signed)
I spoke with patient about results and she verbalized understanding and had no questions 

## 2014-04-29 NOTE — Telephone Encounter (Signed)
2 m Download 03/2014 >> on 16 cm >> no residuals, good usage, mild leak + -may help if she can get better seal , but no changes

## 2014-05-19 ENCOUNTER — Encounter: Payer: Self-pay | Admitting: Gynecology

## 2014-05-19 LAB — HM MAMMOGRAPHY: HM MAMMO: NEGATIVE

## 2014-05-21 ENCOUNTER — Encounter: Payer: Self-pay | Admitting: Internal Medicine

## 2014-06-03 ENCOUNTER — Other Ambulatory Visit (HOSPITAL_COMMUNITY): Payer: Self-pay | Admitting: *Deleted

## 2014-06-21 ENCOUNTER — Encounter: Payer: Self-pay | Admitting: Pulmonary Disease

## 2014-09-09 ENCOUNTER — Other Ambulatory Visit: Payer: Self-pay | Admitting: Internal Medicine

## 2014-10-28 ENCOUNTER — Ambulatory Visit (INDEPENDENT_AMBULATORY_CARE_PROVIDER_SITE_OTHER): Payer: Medicare Other | Admitting: Internal Medicine

## 2014-10-28 ENCOUNTER — Encounter: Payer: Self-pay | Admitting: Internal Medicine

## 2014-10-28 VITALS — BP 120/74 | HR 85 | Temp 97.9°F | Resp 20 | Ht 61.75 in | Wt 196.0 lb

## 2014-10-28 DIAGNOSIS — F329 Major depressive disorder, single episode, unspecified: Secondary | ICD-10-CM

## 2014-10-28 DIAGNOSIS — Z0000 Encounter for general adult medical examination without abnormal findings: Secondary | ICD-10-CM | POA: Diagnosis not present

## 2014-10-28 DIAGNOSIS — E038 Other specified hypothyroidism: Secondary | ICD-10-CM | POA: Diagnosis not present

## 2014-10-28 DIAGNOSIS — E785 Hyperlipidemia, unspecified: Secondary | ICD-10-CM | POA: Diagnosis not present

## 2014-10-28 DIAGNOSIS — F32A Depression, unspecified: Secondary | ICD-10-CM

## 2014-10-28 DIAGNOSIS — Z86010 Personal history of colonic polyps: Secondary | ICD-10-CM | POA: Diagnosis not present

## 2014-10-28 LAB — COMPREHENSIVE METABOLIC PANEL
ALT: 18 U/L (ref 0–35)
AST: 18 U/L (ref 0–37)
Albumin: 4 g/dL (ref 3.5–5.2)
Alkaline Phosphatase: 46 U/L (ref 39–117)
BUN: 18 mg/dL (ref 6–23)
CHLORIDE: 106 meq/L (ref 96–112)
CO2: 30 mEq/L (ref 19–32)
CREATININE: 0.78 mg/dL (ref 0.40–1.20)
Calcium: 9.4 mg/dL (ref 8.4–10.5)
GFR: 75.96 mL/min (ref >60.00)
Glucose, Bld: 102 mg/dL — ABNORMAL HIGH (ref 70–99)
Potassium: 4 mEq/L (ref 3.5–5.1)
Sodium: 142 mEq/L (ref 135–145)
TOTAL PROTEIN: 6.8 g/dL (ref 6.0–8.3)
Total Bilirubin: 0.3 mg/dL (ref 0.2–1.2)

## 2014-10-28 LAB — CBC WITH DIFFERENTIAL/PLATELET
Basophils Absolute: 0 10*3/uL (ref 0.0–0.1)
Basophils Relative: 0.3 % (ref 0.0–3.0)
EOS ABS: 0.1 10*3/uL (ref 0.0–0.7)
Eosinophils Relative: 1.1 % (ref 0.0–5.0)
HCT: 38.3 % (ref 36.0–46.0)
Hemoglobin: 13.1 g/dL (ref 12.0–15.0)
Lymphocytes Relative: 39.1 % (ref 12.0–46.0)
Lymphs Abs: 2.7 10*3/uL (ref 0.7–4.0)
MCHC: 34.1 g/dL (ref 30.0–36.0)
MCV: 94.4 fl (ref 78.0–100.0)
MONOS PCT: 6.9 % (ref 3.0–12.0)
Monocytes Absolute: 0.5 10*3/uL (ref 0.1–1.0)
Neutro Abs: 3.7 10*3/uL (ref 1.4–7.7)
Neutrophils Relative %: 52.6 % (ref 43.0–77.0)
PLATELETS: 189 10*3/uL (ref 150.0–400.0)
RBC: 4.06 Mil/uL (ref 3.87–5.11)
RDW: 13.9 % (ref 11.5–15.5)
WBC: 7 10*3/uL (ref 4.0–10.5)

## 2014-10-28 LAB — LIPID PANEL
CHOLESTEROL: 176 mg/dL (ref 0–200)
HDL: 45.4 mg/dL (ref >39.00)
LDL Cholesterol: 92 mg/dL (ref 0–99)
NONHDL: 130.6
Total CHOL/HDL Ratio: 4
Triglycerides: 192 mg/dL — ABNORMAL HIGH (ref 0.0–149.0)
VLDL: 38.4 mg/dL (ref 0.0–40.0)

## 2014-10-28 LAB — TSH: TSH: 2.78 u[IU]/mL (ref 0.35–4.50)

## 2014-10-28 NOTE — Progress Notes (Signed)
Patient ID: Emily Castaneda, female   DOB: 11-Aug-1935, 80 y.o.   MRN: [id number]  Subjective:    Patient ID: Emily Castaneda, female    DOB: 1935/03/03, 80 y.o.   MRN: [id number]  Hyperlipidemia Pertinent negatives include no chest pain or shortness of breath.   CC: cpx - doing well .   History of Present Illness:   80   year-old patient who is seen today for a comprehensive evaluation.  She has a history of depression and is followed by psychiatry. She is followed by GYN. She has a history of dyslipidemia hypothyroidism, and a history of colonic polyps. She remains on Lipitor 10 mg daily.  Last colonoscopy was 2013.   Here for Medicare AWV:   1. Risk factors based on Past M, S, F history: personal risk factors include dyslipidemia. She has a family history of coronary artery disease and a personal history of colonic polyps.  2. Physical Activities: no activity restrictions, fairly sedentary  3. Depression/mood: history of bipolar depression, which has been stable followed by psychiatry  4. Hearing: no hearing deficits  5. ADL's: completely independent in all aspects of daily living  6. Fall Risk: low  7. Home Safety: no problems identified  8. Height, weight, &visual acuity:no change in high at or weight. Visual acuity is normal. Does have annual eye examinations  9. Counseling: heart healthy diet regular. Exercise and modest weight loss. All encouraged  10. Labs ordered based on risk factors: complete laboratory profile, including lipid panel will be reviewed  11. Referral Coordination- mammogram this fall  12. Care Plan- heart healthy diet calorie restricted diet and exercise regimen. All encouraged  13. Cognitive Assessment- alert and oriented, with normal affect  14.  Preventive services will include annual primary care evaluation was screening lab.  She will continue annual gynecologic checks with mammograms.  Annual eye examinations.  Encouraged Patient was provided with a written  and personalized care plan 15.  Provider list includes primary care psychiatry ophthalmology pulmonary medicine  Allergies (verified):  No Known Drug Allergies   Past History:  Past Medical History:  Colonic polyps, hx of  Sharlett Iles) Depression ( Dr Abner Greenspan)  Diverticulosis, colon  Hyperlipidemia  Hypothyroidism  menopausal syndrome  OSA (CPAP) 17 cm (Clint Young)   Past Surgical History:   Appendectomy 2002  Thyroidectomy  colonoscopy August 2007  2013  Family History:   father died att 17, complications of coronary artery disease  mother died at 42-history of diabetes  one brother, status post CABG  Family History of CAD Female 1st degree relative <50  family history also positive for breast and lung cancer   Social History:   married two daughters, 4 grandchildren    Review of Systems  Constitutional: Negative for fever, appetite change, fatigue and unexpected weight change.  HENT: Negative for congestion, dental problem, ear pain, hearing loss, mouth sores, nosebleeds, sinus pressure, sore throat, tinnitus, trouble swallowing and voice change.   Eyes: Negative for photophobia, pain, redness and visual disturbance.  Respiratory: Negative for cough, chest tightness and shortness of breath.   Cardiovascular: Negative for chest pain, palpitations and leg swelling.  Gastrointestinal: Negative for nausea, vomiting, abdominal pain, diarrhea, constipation, blood in stool, abdominal distention and rectal pain.  Genitourinary: Negative for dysuria, urgency, frequency, hematuria, flank pain, vaginal bleeding, vaginal discharge, difficulty urinating, genital sores, vaginal pain, menstrual problem and pelvic pain.  Musculoskeletal: Negative for back pain, arthralgias and neck stiffness.  Mild right posterior thigh pain and tenderness  Skin: Negative for rash.  Neurological: Negative for dizziness, syncope, speech difficulty, weakness, light-headedness, numbness and headaches.   Hematological: Negative for adenopathy. Does not bruise/bleed easily.  Psychiatric/Behavioral: Negative for suicidal ideas, behavioral problems, self-injury, dysphoric mood and agitation. The patient is not nervous/anxious.        Objective:   Physical Exam  Constitutional: She is oriented to person, place, and time. She appears well-developed and well-nourished.  HENT:  Head: Normocephalic and atraumatic.  Right Ear: External ear normal.  Left Ear: External ear normal.  Mouth/Throat: Oropharynx is clear and moist.  Marked pharyngeal crowding  Eyes: Conjunctivae and EOM are normal.  Neck: Normal range of motion. Neck supple. No JVD present. No thyromegaly present.  Cardiovascular: Normal rate, regular rhythm, normal heart sounds and intact distal pulses.   No murmur heard. Right dorsalis pedis pulse diminished  Pulmonary/Chest: Effort normal and breath sounds normal. She has no wheezes. She has no rales.  Abdominal: Soft. Bowel sounds are normal. She exhibits no distension and no mass. There is no tenderness. There is no rebound and no guarding.  Obese Lower midline scar  Genitourinary: Vagina normal.  Musculoskeletal: Normal range of motion. She exhibits no edema or tenderness.  Neurological: She is alert and oriented to person, place, and time. She has normal reflexes. No cranial nerve deficit. She exhibits normal muscle tone. Coordination normal.  Skin: Skin is warm and dry. No rash noted.  Psychiatric: She has a normal mood and affect. Her behavior is normal.          Assessment & Plan:   Preventive health examination Hypothyroidism. We'll check a TSH Hyperlipidemia. A lipid profile be reviewed Exogenous obesity. Weight loss exercise encouraged History colonic polyps. History depression. Followup psychiatry Menopausal syndrome. A recent mammogram has been performed;  she does receive annual gynecologic care OSA on CPAP. Stable   Recheck 1 year or as needed All  medications refilled

## 2014-10-28 NOTE — Patient Instructions (Signed)
It is important that you exercise regularly, at least 20 minutes 3 to 4 times per week.  If you develop chest pain or shortness of breath seek  medical attention.  Take a calcium supplement, plus 800-1200 units of vitamin D  Health Maintenance Adopting a healthy lifestyle and getting preventive care can go a long way to promote health and wellness. Talk with your health care provider about what schedule of regular examinations is right for you. This is a good chance for you to check in with your provider about disease prevention and staying healthy. In between checkups, there are plenty of things you can do on your own. Experts have done a lot of research about which lifestyle changes and preventive measures are most likely to keep you healthy. Ask your health care provider for more information. WEIGHT AND DIET  Eat a healthy diet  Be sure to include plenty of vegetables, fruits, low-fat dairy products, and lean protein.  Do not eat a lot of foods high in solid fats, added sugars, or salt.  Get regular exercise. This is one of the most important things you can do for your health.  Most adults should exercise for at least 150 minutes each week. The exercise should increase your heart rate and make you sweat (moderate-intensity exercise).  Most adults should also do strengthening exercises at least twice a week. This is in addition to the moderate-intensity exercise.  Maintain a healthy weight  Body mass index (BMI) is a measurement that can be used to identify possible weight problems. It estimates body fat based on height and weight. Your health care provider can help determine your BMI and help you achieve or maintain a healthy weight.  For females 20 years of age and older:   A BMI below 18.5 is considered underweight.  A BMI of 18.5 to 24.9 is normal.  A BMI of 25 to 29.9 is considered overweight.  A BMI of 30 and above is considered obese.  Watch levels of cholesterol and  blood lipids  You should start having your blood tested for lipids and cholesterol at 78 years of age, then have this test every 5 years.  You may need to have your cholesterol levels checked more often if:  Your lipid or cholesterol levels are high.  You are older than 78 years of age.  You are at high risk for heart disease.  CANCER SCREENING   Lung Cancer  Lung cancer screening is recommended for adults 55-80 years old who are at high risk for lung cancer because of a history of smoking.  A yearly low-dose CT scan of the lungs is recommended for people who:  Currently smoke.  Have quit within the past 15 years.  Have at least a 30-pack-year history of smoking. A pack year is smoking an average of one pack of cigarettes a day for 1 year.  Yearly screening should continue until it has been 15 years since you quit.  Yearly screening should stop if you develop a health problem that would prevent you from having lung cancer treatment.  Breast Cancer  Practice breast self-awareness. This means understanding how your breasts normally appear and feel.  It also means doing regular breast self-exams. Let your health care provider know about any changes, no matter how small.  If you are in your 20s or 30s, you should have a clinical breast exam (CBE) by a health care provider every 1-3 years as part of a regular health exam.    If you are 40 or older, have a CBE every year. Also consider having a breast X-ray (mammogram) every year.  If you have a family history of breast cancer, talk to your health care provider about genetic screening.  If you are at high risk for breast cancer, talk to your health care provider about having an MRI and a mammogram every year.  Breast cancer gene (BRCA) assessment is recommended for women who have family members with BRCA-related cancers. BRCA-related cancers include:  Breast.  Ovarian.  Tubal.  Peritoneal cancers.  Results of the  assessment will determine the need for genetic counseling and BRCA1 and BRCA2 testing. Cervical Cancer Routine pelvic examinations to screen for cervical cancer are no longer recommended for nonpregnant women who are considered low risk for cancer of the pelvic organs (ovaries, uterus, and vagina) and who do not have symptoms. A pelvic examination may be necessary if you have symptoms including those associated with pelvic infections. Ask your health care provider if a screening pelvic exam is right for you.   The Pap test is the screening test for cervical cancer for women who are considered at risk.  If you had a hysterectomy for a problem that was not cancer or a condition that could lead to cancer, then you no longer need Pap tests.  If you are older than 65 years, and you have had normal Pap tests for the past 10 years, you no longer need to have Pap tests.  If you have had past treatment for cervical cancer or a condition that could lead to cancer, you need Pap tests and screening for cancer for at least 20 years after your treatment.  If you no longer get a Pap test, assess your risk factors if they change (such as having a new sexual partner). This can affect whether you should start being screened again.  Some women have medical problems that increase their chance of getting cervical cancer. If this is the case for you, your health care provider may recommend more frequent screening and Pap tests.  The human papillomavirus (HPV) test is another test that may be used for cervical cancer screening. The HPV test looks for the virus that can cause cell changes in the cervix. The cells collected during the Pap test can be tested for HPV.  The HPV test can be used to screen women 30 years of age and older. Getting tested for HPV can extend the interval between normal Pap tests from three to five years.  An HPV test also should be used to screen women of any age who have unclear Pap test  results.  After 78 years of age, women should have HPV testing as often as Pap tests.  Colorectal Cancer  This type of cancer can be detected and often prevented.  Routine colorectal cancer screening usually begins at 78 years of age and continues through 78 years of age.  Your health care provider may recommend screening at an earlier age if you have risk factors for colon cancer.  Your health care provider may also recommend using home test kits to check for hidden blood in the stool.  A small camera at the end of a tube can be used to examine your colon directly (sigmoidoscopy or colonoscopy). This is done to check for the earliest forms of colorectal cancer.  Routine screening usually begins at age 50.  Direct examination of the colon should be repeated every 5-10 years through 78 years of age. However,   you may need to be screened more often if early forms of precancerous polyps or small growths are found. Skin Cancer  Check your skin from head to toe regularly.  Tell your health care provider about any new moles or changes in moles, especially if there is a change in a mole's shape or color.  Also tell your health care provider if you have a mole that is larger than the size of a pencil eraser.  Always use sunscreen. Apply sunscreen liberally and repeatedly throughout the day.  Protect yourself by wearing long sleeves, pants, a wide-brimmed hat, and sunglasses whenever you are outside. HEART DISEASE, DIABETES, AND HIGH BLOOD PRESSURE   Have your blood pressure checked at least every 1-2 years. High blood pressure causes heart disease and increases the risk of stroke.  If you are between 55 years and 79 years old, ask your health care provider if you should take aspirin to prevent strokes.  Have regular diabetes screenings. This involves taking a blood sample to check your fasting blood sugar level.  If you are at a normal weight and have a low risk for diabetes, have this  test once every three years after 78 years of age.  If you are overweight and have a high risk for diabetes, consider being tested at a younger age or more often. PREVENTING INFECTION  Hepatitis B  If you have a higher risk for hepatitis B, you should be screened for this virus. You are considered at high risk for hepatitis B if:  You were born in a country where hepatitis B is common. Ask your health care provider which countries are considered high risk.  Your parents were born in a high-risk country, and you have not been immunized against hepatitis B (hepatitis B vaccine).  You have HIV or AIDS.  You use needles to inject street drugs.  You live with someone who has hepatitis B.  You have had sex with someone who has hepatitis B.  You get hemodialysis treatment.  You take certain medicines for conditions, including cancer, organ transplantation, and autoimmune conditions. Hepatitis C  Blood testing is recommended for:  Everyone born from 1945 through 1965.  Anyone with known risk factors for hepatitis C. Sexually transmitted infections (STIs)  You should be screened for sexually transmitted infections (STIs) including gonorrhea and chlamydia if:  You are sexually active and are younger than 78 years of age.  You are older than 78 years of age and your health care provider tells you that you are at risk for this type of infection.  Your sexual activity has changed since you were last screened and you are at an increased risk for chlamydia or gonorrhea. Ask your health care provider if you are at risk.  If you do not have HIV, but are at risk, it may be recommended that you take a prescription medicine daily to prevent HIV infection. This is called pre-exposure prophylaxis (PrEP). You are considered at risk if:  You are sexually active and do not regularly use condoms or know the HIV status of your partner(s).  You take drugs by injection.  You are sexually active with  a partner who has HIV. Talk with your health care provider about whether you are at high risk of being infected with HIV. If you choose to begin PrEP, you should first be tested for HIV. You should then be tested every 3 months for as long as you are taking PrEP.  PREGNANCY   If   you are premenopausal and you may become pregnant, ask your health care provider about preconception counseling.  If you may become pregnant, take 400 to 800 micrograms (mcg) of folic acid every day.  If you want to prevent pregnancy, talk to your health care provider about birth control (contraception). OSTEOPOROSIS AND MENOPAUSE   Osteoporosis is a disease in which the bones lose minerals and strength with aging. This can result in serious bone fractures. Your risk for osteoporosis can be identified using a bone density scan.  If you are 65 years of age or older, or if you are at risk for osteoporosis and fractures, ask your health care provider if you should be screened.  Ask your health care provider whether you should take a calcium or vitamin D supplement to lower your risk for osteoporosis.  Menopause may have certain physical symptoms and risks.  Hormone replacement therapy may reduce some of these symptoms and risks. Talk to your health care provider about whether hormone replacement therapy is right for you.  HOME CARE INSTRUCTIONS   Schedule regular health, dental, and eye exams.  Stay current with your immunizations.   Do not use any tobacco products including cigarettes, chewing tobacco, or electronic cigarettes.  If you are pregnant, do not drink alcohol.  If you are breastfeeding, limit how much and how often you drink alcohol.  Limit alcohol intake to no more than 1 drink per day for nonpregnant women. One drink equals 12 ounces of beer, 5 ounces of wine, or 1 ounces of hard liquor.  Do not use street drugs.  Do not share needles.  Ask your health care provider for help if you need  support or information about quitting drugs.  Tell your health care provider if you often feel depressed.  Tell your health care provider if you have ever been abused or do not feel safe at home. Document Released: 02/19/2011 Document Revised: 12/21/2013 Document Reviewed: 07/08/2013 Exit[employer] Patient Information 2015 [employer]. This information is not intended to replace advice given to you by your health care provider. Make sure you discuss any questions you have with your health care provider.  

## 2014-10-28 NOTE — Progress Notes (Signed)
Pre visit review using our clinic review tool, if applicable. No additional management support is needed unless otherwise documented below in the visit note. 

## 2014-11-16 ENCOUNTER — Other Ambulatory Visit: Payer: Self-pay | Admitting: Internal Medicine

## 2015-01-04 DIAGNOSIS — H2513 Age-related nuclear cataract, bilateral: Secondary | ICD-10-CM | POA: Diagnosis not present

## 2015-01-04 DIAGNOSIS — H02834 Dermatochalasis of left upper eyelid: Secondary | ICD-10-CM | POA: Diagnosis not present

## 2015-01-04 DIAGNOSIS — H04123 Dry eye syndrome of bilateral lacrimal glands: Secondary | ICD-10-CM | POA: Diagnosis not present

## 2015-01-04 DIAGNOSIS — H02831 Dermatochalasis of right upper eyelid: Secondary | ICD-10-CM | POA: Diagnosis not present

## 2015-01-21 ENCOUNTER — Ambulatory Visit (INDEPENDENT_AMBULATORY_CARE_PROVIDER_SITE_OTHER): Payer: Medicare Other | Admitting: Gynecology

## 2015-01-21 ENCOUNTER — Encounter: Payer: Self-pay | Admitting: Gynecology

## 2015-01-21 VITALS — BP 130/76 | Ht 61.75 in | Wt 197.0 lb

## 2015-01-21 DIAGNOSIS — Z01419 Encounter for gynecological examination (general) (routine) without abnormal findings: Secondary | ICD-10-CM | POA: Diagnosis not present

## 2015-01-21 NOTE — Progress Notes (Signed)
Emily Castaneda Going 1935/05/01 [id number]   History:    80 y.o.  for annual gyn exam with no complaints today.The patient was.a former patient of Dr. Rexford Maus and we began to see her as a new patient in 2014. Review of her records indicated the following:  resectoscopic polypectomy 1999 benign endometrial polyp no hyperplasia or malignancy identified   Resectoscopic polypectomy also in 1989  CIN-1 treated with LEEP excision several years ago  Endometrial biopsy benign February 2012 for postmenopausal bleeding. No further bleeding reported. . Patient did have her shingles vaccine last in 2014. Her Tdap and Pneumovax vaccine according to her had been administered by her PCP. Patient has had normal Pap smear over the past 4 years. Patient had a normal bone density in 2014. Her last colonoscopy was in 2013 benign polyps had been removed.   Patient has history of bipolar disorder. Her primary physician has been treating her for her dyslipidemia.  Past medical history,surgical history, family history and social history were all reviewed and documented in the EPIC chart.  Gynecologic History No LMP recorded. Patient is postmenopausal. Contraception: post menopausal status Last Pap: 2014. Results were: normal Last mammogram: 2015. Results were: normal  Obstetric History OB History  Gravida Para Term Preterm AB SAB TAB Ectopic Multiple Living  3 3        3     # Outcome Date GA Lbr Len/2nd Weight Sex Delivery Anes PTL Lv  3 Para           2 Para           1 Para                ROS: A ROS was performed and pertinent positives and negatives are included in the history.  GENERAL: No fevers or chills. HEENT: No change in vision, no earache, sore throat or sinus congestion. NECK: No pain or stiffness. CARDIOVASCULAR: No chest pain or pressure. No palpitations. PULMONARY: No shortness of breath, cough or wheeze. GASTROINTESTINAL: No abdominal pain, nausea, vomiting or diarrhea, melena or  bright red blood per rectum. GENITOURINARY: No urinary frequency, urgency, hesitancy or dysuria. MUSCULOSKELETAL: No joint or muscle pain, no back pain, no recent trauma. DERMATOLOGIC: No rash, no itching, no lesions. ENDOCRINE: No polyuria, polydipsia, no heat or cold intolerance. No recent change in weight. HEMATOLOGICAL: No anemia or easy bruising or bleeding. NEUROLOGIC: No headache, seizures, numbness, tingling or weakness. PSYCHIATRIC: No depression, no loss of interest in normal activity or change in sleep pattern.     Exam: chaperone present  BP 130/76 mmHg  Ht 5' 1.75" (1.568 m)  Wt 197 lb (89.359 kg)  BMI 36.35 kg/m2  Body mass index is 36.35 kg/(m^2).  General appearance : Well developed well nourished female. No acute distress HEENT: Eyes: no retinal hemorrhage or exudates,  Neck supple, trachea midline, no carotid bruits, no thyroidmegaly Lungs: Clear to auscultation, no rhonchi or wheezes, or rib retractions  Heart: Regular rate and rhythm, no murmurs or gallops Breast:Examined in sitting and supine position were symmetrical in appearance, no palpable masses or tenderness,  no skin retraction, no nipple inversion, no nipple discharge, no skin discoloration, no axillary or supraclavicular lymphadenopathy Abdomen: no palpable masses or tenderness, no rebound or guarding Extremities: no edema or skin discoloration or tenderness  Pelvic:  Bartholin, Urethra, Skene Glands: Within normal limits             Vagina: No gross lesions or discharge, atrophic changes  Cervix: No gross lesions or discharge  Uterus  anteverted, normal size, shape and consistency, non-tender and mobile  Adnexa  Without masses or tenderness  Anus and perineum  normal   Rectovaginal  normal sphincter tone without palpated masses or tenderness             Hemoccult PCP provides     Assessment/Plan:  80 y.o. female for annual exam with no abnormality detected today. Her PCP has been doing her blood work.  Patient needs her mammogram later this year. She will touch base with her PCP if and when she will need her next colonoscopy since benign polyps have been removed in the past and her last study was 5 years ago. Patient will need a bone density study next year. We discussed importance of calcium vitamin D and regular exercise for osteoporosis prevention. We discussed importance of monthly breast exam. No Pap smear done today in accordance to the new guidelines.   Terrance Mass MD, 11:27 AM 01/21/2015

## 2015-05-09 ENCOUNTER — Other Ambulatory Visit: Payer: Self-pay | Admitting: Internal Medicine

## 2015-06-02 DIAGNOSIS — Z1231 Encounter for screening mammogram for malignant neoplasm of breast: Secondary | ICD-10-CM | POA: Diagnosis not present

## 2015-06-02 LAB — HM MAMMOGRAPHY: HM Mammogram: NEGATIVE

## 2015-06-03 ENCOUNTER — Encounter: Payer: Self-pay | Admitting: Gynecology

## 2015-06-16 ENCOUNTER — Encounter: Payer: Self-pay | Admitting: Internal Medicine

## 2015-06-18 ENCOUNTER — Other Ambulatory Visit: Payer: Self-pay | Admitting: Internal Medicine

## 2015-07-11 DIAGNOSIS — L03031 Cellulitis of right toe: Secondary | ICD-10-CM | POA: Diagnosis not present

## 2015-07-28 DIAGNOSIS — Z79899 Other long term (current) drug therapy: Secondary | ICD-10-CM | POA: Diagnosis not present

## 2015-09-13 ENCOUNTER — Encounter: Payer: Self-pay | Admitting: Gastroenterology

## 2015-10-24 ENCOUNTER — Other Ambulatory Visit: Payer: Self-pay | Admitting: Internal Medicine

## 2015-10-24 ENCOUNTER — Encounter: Payer: Self-pay | Admitting: Internal Medicine

## 2015-10-24 ENCOUNTER — Ambulatory Visit (INDEPENDENT_AMBULATORY_CARE_PROVIDER_SITE_OTHER): Payer: Medicare Other | Admitting: Internal Medicine

## 2015-10-24 VITALS — BP 120/78 | HR 90 | Temp 98.4°F | Resp 20 | Ht 61.75 in | Wt 205.0 lb

## 2015-10-24 DIAGNOSIS — K5730 Diverticulosis of large intestine without perforation or abscess without bleeding: Secondary | ICD-10-CM | POA: Diagnosis not present

## 2015-10-24 DIAGNOSIS — E785 Hyperlipidemia, unspecified: Secondary | ICD-10-CM

## 2015-10-24 DIAGNOSIS — K591 Functional diarrhea: Secondary | ICD-10-CM | POA: Diagnosis not present

## 2015-10-24 MED ORDER — HYOSCYAMINE SULFATE 0.125 MG SL SUBL: 0.1250 mg | SUBLINGUAL_TABLET | SUBLINGUAL | Status: DC | PRN

## 2015-10-24 NOTE — Progress Notes (Signed)
Subjective:    Patient ID: Emily Castaneda, female    DOB: 07-16-1935, 81 y.o.   MRN: [id number]  HPI  81 year old patient who stable medical problems include a history of diverticulosis, depression, and dyslipidemia. She presents with a chief complaint of diarrhea.  Approximately 2 months ago.  She had an episode of cramping and explosive diarrhea.  Intermittently over the past 2 months she has had some crampy abdominal pain and some loose bowel movements.  She has been taking occasional Imodium.  Most days she has a normal bowel movement.  She has some occasional constipation issues, but these are unusual. No nausea or vomiting  Wt Readings from Last 3 Encounters:  10/24/15 205 lb (92.987 kg)  01/21/15 197 lb (89.359 kg)  10/28/14 196 lb (88.905 kg)    Past Medical History  Diagnosis Date  . Hx of colonic polyps   . Depression     Dr. Abner Greenspan  . Diverticulosis of colon   . Hyperlipidemia   . Hypothyroidism   . Menopausal syndrome   . OSA (obstructive sleep apnea)     cpap; 17 cm (clint young)  . Endometriosis   . Manic, depressive ([place])     Social History   Social History  . Marital Status: Married    Spouse Name: N/A  . Number of Children: N/A  . Years of Education: N/A   Occupational History  . retired    Social History Main Topics  . Smoking status: Former Smoker -- 0.10 packs/day for 10 years    Types: Cigarettes    Quit date: 08/21/1983  . Smokeless tobacco: Never Used     Comment: only smoked socially  . Alcohol Use: 0.6 - 1.2 oz/week    1-2 Standard drinks or equivalent per week     Comment: occ glass of wine   . Drug Use: No  . Sexual Activity: Not on file   Other Topics Concern  . Not on file   Social History Narrative    Past Surgical History  Procedure Laterality Date  . Polypectomy      Dr. Ubaldo Glassing  . Colonoscopy    . Appendectomy    . Tonsillectomy and adenoidectomy      Family History  Problem Relation Age of Onset  . Diabetes  Mother   . Heart disease Father     complications of coronary heart disease  . Heart disease Other   . Cancer Other     BREAST AND LUNG   . Diabetes Brother   . Colon cancer Neg Hx   . Rectal cancer Neg Hx   . Stomach cancer Neg Hx   . Breast cancer Maternal Aunt   . Cancer Maternal Grandfather     LUNG  . Breast cancer Daughter     No Known Allergies  Current Outpatient Prescriptions on File Prior to Visit  Medication Sig Dispense Refill  . atorvastatin (LIPITOR) 10 MG tablet TAKE 1 TABLET BY MOUTH DAILY 90 tablet 1  . cholecalciferol (VITAMIN D) 1000 UNITS tablet Take 1,000 Units by mouth daily.    . clonazePAM (KLONOPIN) 1 MG tablet Take 1 mg by mouth at bedtime.     . divalproex (DEPAKOTE ER) 250 MG 24 hr tablet Take 1,250 mg by mouth every evening.    . eszopiclone (LUNESTA) 2 MG TABS Take 2 mg by mouth at bedtime as needed for sleep. Take immediately before bedtime    . levothyroxine (SYNTHROID, LEVOTHROID) 100 MCG tablet TAKE  1 TABLET BY MOUTH EVERY DAY 90 tablet 2   No current facility-administered medications on file prior to visit.    BP 120/78 mmHg  Pulse 90  Temp(Src) 98.4 F (36.9 C) (Oral)  Resp 20  Ht 5' 1.75" (1.568 m)  Wt 205 lb (92.987 kg)  BMI 37.82 kg/m2  SpO2 98%   \  Review of Systems  Constitutional: Negative.   HENT: Negative for congestion, dental problem, hearing loss, rhinorrhea, sinus pressure, sore throat and tinnitus.   Eyes: Negative for pain, discharge and visual disturbance.  Respiratory: Negative for cough and shortness of breath.   Cardiovascular: Negative for chest pain, palpitations and leg swelling.  Gastrointestinal: Positive for diarrhea. Negative for nausea, vomiting, abdominal pain, constipation, blood in stool and abdominal distention.  Genitourinary: Negative for dysuria, urgency, frequency, hematuria, flank pain, vaginal bleeding, vaginal discharge, difficulty urinating, vaginal pain and pelvic pain.  Musculoskeletal:  Negative for joint swelling, arthralgias and gait problem.  Skin: Positive for rash.  Neurological: Negative for dizziness, syncope, speech difficulty, weakness, numbness and headaches.  Hematological: Negative for adenopathy.  Psychiatric/Behavioral: Negative for behavioral problems, dysphoric mood and agitation. The patient is not nervous/anxious.        Objective:   Physical Exam  Constitutional: She is oriented to person, place, and time. She appears well-developed and well-nourished. No distress.  HENT:  Head: Normocephalic.  Right Ear: External ear normal.  Left Ear: External ear normal.  Mouth/Throat: Oropharynx is clear and moist.  Eyes: Conjunctivae and EOM are normal. Pupils are equal, round, and reactive to light.  Neck: Normal range of motion. Neck supple. No thyromegaly present.  Cardiovascular: Normal rate, regular rhythm, normal heart sounds and intact distal pulses.   Pulmonary/Chest: Effort normal and breath sounds normal.  Abdominal: Soft. Bowel sounds are normal. She exhibits no distension and no mass. There is no tenderness. There is no rebound and no guarding.  Musculoskeletal: Normal range of motion.  Lymphadenopathy:    She has no cervical adenopathy.  Neurological: She is alert and oriented to person, place, and time.  Skin: Skin is warm and dry. No rash noted.  Mild erythematous rash in the left anterolateral neck area  Psychiatric: She has a normal mood and affect. Her behavior is normal.          Assessment & Plan:   Intermittent diarrhea, history of diverticulosis.  Will place on a high-fiber diet liberal fluid intake and exercise discussed Ditropan.  Information is concerned high-fiber diet, dispensed Hypothyroidism Diverticulosis  Will report any clinical worsening.  We'll also place on a anti-spasmodic to take when necessary

## 2015-10-24 NOTE — Progress Notes (Signed)
Pre visit review using our clinic review tool, if applicable. No additional management support is needed unless otherwise documented below in the visit note. 

## 2015-10-24 NOTE — Patient Instructions (Signed)
High-Fiber Diet  Fiber, also called dietary fiber, is a type of carbohydrate found in fruits, vegetables, whole grains, and beans. A high-fiber diet can have many health benefits. Your health care provider may recommend a high-fiber diet to help:  · Prevent constipation. Fiber can make your bowel movements more regular.  · Lower your cholesterol.  · Relieve hemorrhoids, uncomplicated diverticulosis, or irritable bowel syndrome.  · Prevent overeating as part of a weight-loss plan.  · Prevent heart disease, type 2 diabetes, and certain cancers.  WHAT IS MY PLAN?  The recommended daily intake of fiber includes:  · 38 grams for men under age 50.  · 30 grams for men over age 50.  · 25 grams for women under age 50.  · 21 grams for women over age 50.  You can get the recommended daily intake of dietary fiber by eating a variety of fruits, vegetables, grains, and beans. Your health care provider may also recommend a fiber supplement if it is not possible to get enough fiber through your diet.  WHAT DO I NEED TO KNOW ABOUT A HIGH-FIBER DIET?  · Fiber supplements have not been widely studied for their effectiveness, so it is better to get fiber through food sources.  · Always check the fiber content on the nutrition facts label of any prepackaged food. Look for foods that contain at least 5 grams of fiber per serving.  · Ask your dietitian if you have questions about specific foods that are related to your condition, especially if those foods are not listed in the following section.  · Increase your daily fiber consumption gradually. Increasing your intake of dietary fiber too quickly may cause bloating, cramping, or gas.  · Drink plenty of water. Water helps you to digest fiber.  WHAT FOODS CAN I EAT?  Grains  Whole-grain breads. Multigrain cereal. Oats and oatmeal. Brown rice. Barley. Bulgur wheat. Millet. Bran muffins. Popcorn. Rye wafer crackers.  Vegetables  Sweet potatoes. Spinach. Kale. Artichokes. Cabbage. Broccoli.  Green peas. Carrots. Squash.  Fruits  Berries. Pears. Apples. Oranges. Avocados. Prunes and raisins. Dried figs.  Meats and Other Protein Sources  [place]y, kidney, pinto, and soy beans. Split peas. Lentils. Nuts and seeds.  Dairy  Fiber-fortified yogurt.  Beverages  Fiber-fortified soy milk. Fiber-fortified orange juice.  Other  Fiber bars.  The items listed above may not be a complete list of recommended foods or beverages. Contact your dietitian for more options.  WHAT FOODS ARE NOT RECOMMENDED?  Grains  White bread. Pasta made with refined flour. White rice.  Vegetables  Fried potatoes. Canned vegetables. Well-cooked vegetables.   Fruits  Fruit juice. Cooked, strained fruit.  Meats and Other Protein Sources  Fatty cuts of meat. Fried poultry or fried fish.  Dairy  Milk. Yogurt. Cream cheese. Sour cream.  Beverages  Soft drinks.  Other  Cakes and pastries. Butter and oils.  The items listed above may not be a complete list of foods and beverages to avoid. Contact your dietitian for more information.  WHAT ARE SOME TIPS FOR INCLUDING HIGH-FIBER FOODS IN MY DIET?  · Eat a wide variety of high-fiber foods.  · Make sure that half of all grains consumed each day are whole grains.  · Replace breads and cereals made from refined flour or white flour with whole-grain breads and cereals.  · Replace white rice with brown rice, bulgur wheat, or millet.  · Start the day with a breakfast that is high in fiber, such as a   cereal that contains at least 5 grams of fiber per serving.  · Use beans in place of meat in soups, salads, or pasta.  · Eat high-fiber snacks, such as berries, raw vegetables, nuts, or popcorn.     This information is not intended to replace advice given to you by your health care provider. Make sure you discuss any questions you have with your health care provider.     Document Released: 08/06/2005 Document Revised: 08/27/2014 Document Reviewed: 01/19/2014  Elsevier Interactive Patient Education ©2016 [employer]

## 2015-10-27 ENCOUNTER — Telehealth: Payer: Self-pay | Admitting: Pulmonary Disease

## 2015-10-27 NOTE — Telephone Encounter (Signed)
Pt has not been seen since 04/19/14. Called spoke with pt. appt scheduled to see TP tomorrow for appt to get CPAP supplies. Nothing further needed

## 2015-10-28 ENCOUNTER — Ambulatory Visit (INDEPENDENT_AMBULATORY_CARE_PROVIDER_SITE_OTHER): Payer: Medicare Other | Admitting: Adult Health

## 2015-10-28 ENCOUNTER — Encounter: Payer: Self-pay | Admitting: Adult Health

## 2015-10-28 VITALS — BP 118/74 | HR 90 | Temp 97.5°F | Ht 63.0 in | Wt 203.0 lb

## 2015-10-28 DIAGNOSIS — G4733 Obstructive sleep apnea (adult) (pediatric): Secondary | ICD-10-CM

## 2015-10-28 DIAGNOSIS — Z9989 Dependence on other enabling machines and devices: Principal | ICD-10-CM

## 2015-10-28 NOTE — Patient Instructions (Signed)
Continue on CPAP At bedtime   Wear for at least 6hr each night .  Work on [employer] loss -great job.  Do not drive if sleepy.  Follow up Dr. Elsworth Soho  In 1 year and As needed

## 2015-10-28 NOTE — Assessment & Plan Note (Signed)
Well controlled on CPAP   Plan  Continue on CPAP At bedtime   Wear for at least 6hr each night .  Work on [employer] loss -great job.  Do not drive if sleepy.  Follow up Dr. Elsworth Soho  In 1 year and As needed

## 2015-10-28 NOTE — Progress Notes (Signed)
Subjective:    Patient ID: Emily Castaneda, female    DOB: 18-Oct-1934, 81 y.o.   MRN: [id number]  HPI 81 yo female  With severe OSA  Bipolar dz  10/28/2015 Follow up : OSA  Pt returns for follow up for sleep apnea.  She says she is doing great on CPAP  "Cant live without it"  Wears it for about 7 hr each night.  Download shows excellent compliance with set pressure at 16cm.  AHI 0.4, avg usage 7.5hr . + leaks Mask is old, had to tape, needs order for new supplies.   Denies chest pain, orthopnea, edema or fever.    Past Medical History  Diagnosis Date  . Hx of colonic polyps   . Depression     Dr. Abner Greenspan  . Diverticulosis of colon   . Hyperlipidemia   . Hypothyroidism   . Menopausal syndrome   . OSA (obstructive sleep apnea)     cpap; 17 cm (clint young)  . Endometriosis   . Manic, depressive ([place])    Current Outpatient Prescriptions on File Prior to Visit  Medication Sig Dispense Refill  . atorvastatin (LIPITOR) 10 MG tablet TAKE 1 TABLET BY MOUTH DAILY 90 tablet 1  . cholecalciferol (VITAMIN D) 1000 UNITS tablet Take 1,000 Units by mouth daily.    . clonazePAM (KLONOPIN) 1 MG tablet Take 1 mg by mouth at bedtime.     . divalproex (DEPAKOTE ER) 250 MG 24 hr tablet Take 1,250 mg by mouth every evening.    . eszopiclone (LUNESTA) 2 MG TABS Take 2 mg by mouth at bedtime as needed for sleep. Take immediately before bedtime    . levothyroxine (SYNTHROID, LEVOTHROID) 100 MCG tablet TAKE 1 TABLET BY MOUTH EVERY DAY 90 tablet 2  . hyoscyamine (LEVSIN SL) 0.125 MG SL tablet DISSOLVE 1 TABLET(0.125 MG) UNDER THE TONGUE EVERY 4 HOURS AS NEEDED (Patient not taking: Reported on 10/28/2015) 540 tablet 1   No current facility-administered medications on file prior to visit.     Review of Systems Constitutional:   No  weight loss, night sweats,  Fevers, chills, fatigue, or  lassitude.  HEENT:   No headaches,  Difficulty swallowing,  Tooth/dental problems, or  Sore throat,         No sneezing, itching, ear ache, nasal congestion, post nasal drip,   CV:  No chest pain,  Orthopnea, PND, swelling in lower extremities, anasarca, dizziness, palpitations, syncope.   GI  No heartburn, indigestion, abdominal pain, nausea, vomiting, diarrhea, change in bowel habits, loss of appetite, bloody stools.   Resp: No shortness of breath with exertion or at rest.  No excess mucus, no productive cough,  No non-productive cough,  No coughing up of blood.  No change in color of mucus.  No wheezing.  No chest wall deformity  Skin: no rash or lesions.  GU: no dysuria, change in color of urine, no urgency or frequency.  No flank pain, no hematuria   MS:  No joint pain or swelling.  No decreased range of motion.  No back pain.  Psych:  No change in mood or affect. No depression or anxiety.  No memory loss.         Objective:   Physical Exam  Filed Vitals:   10/28/15 1414  BP: 118/74  Pulse: 90  Temp: 97.5 F (36.4 C)  TempSrc: Oral  Height: 5\' 3"  (1.6 m)  Weight: 203 lb (92.08 kg)  SpO2: 96%   GEN:  A/Ox3; pleasant , NAD, obese   HEENT:  [place]/AT,  EACs-clear, TMs-wnl, NOSE-clear, THROAT-clear, no lesions, no postnasal drip or exudate noted. Class 2-3 MP airway   NECK:  Supple w/ fair ROM; no JVD; normal carotid impulses w/o bruits; no thyromegaly or nodules palpated; no lymphadenopathy.  RESP  Clear  P & A; w/o, wheezes/ rales/ or rhonchi.no accessory muscle use, no dullness to percussion  CARD:  RRR, no m/r/g  , no peripheral edema, pulses intact, no cyanosis or clubbing.  GI:   Soft & nt; nml bowel sounds; no organomegaly or masses detected.  Musco: Warm bil, no deformities or joint swelling noted.   Neuro: alert, no focal deficits noted.    Skin: Warm, no lesions or rashes  Tammy Parrett NP-C  [facility]  [phone]        Assessment & Plan:

## 2015-10-28 NOTE — Addendum Note (Signed)
Addended by: Osa Craver on: 10/28/2015 02:55 PM   Modules accepted: Orders

## 2015-11-02 DIAGNOSIS — G4733 Obstructive sleep apnea (adult) (pediatric): Secondary | ICD-10-CM | POA: Diagnosis not present

## 2015-11-02 DIAGNOSIS — G4730 Sleep apnea, unspecified: Secondary | ICD-10-CM | POA: Diagnosis not present

## 2015-11-03 NOTE — Progress Notes (Signed)
Reviewed & agree with plan  

## 2015-11-21 ENCOUNTER — Other Ambulatory Visit (HOSPITAL_COMMUNITY): Payer: Self-pay | Admitting: Psychiatry

## 2015-12-13 ENCOUNTER — Other Ambulatory Visit: Payer: Self-pay | Admitting: Internal Medicine

## 2016-01-12 DIAGNOSIS — H353131 Nonexudative age-related macular degeneration, bilateral, early dry stage: Secondary | ICD-10-CM | POA: Diagnosis not present

## 2016-01-12 DIAGNOSIS — H04123 Dry eye syndrome of bilateral lacrimal glands: Secondary | ICD-10-CM | POA: Diagnosis not present

## 2016-01-12 DIAGNOSIS — H25813 Combined forms of age-related cataract, bilateral: Secondary | ICD-10-CM | POA: Diagnosis not present

## 2016-01-31 ENCOUNTER — Other Ambulatory Visit: Payer: Self-pay | Admitting: Internal Medicine

## 2016-01-31 NOTE — Telephone Encounter (Signed)
Refill sent to pharmacy.   

## 2016-02-08 ENCOUNTER — Telehealth: Payer: Self-pay | Admitting: Internal Medicine

## 2016-02-08 NOTE — Telephone Encounter (Signed)
Please bring pt in for medication follow up because she needs labs done for thyroid.

## 2016-02-08 NOTE — Telephone Encounter (Signed)
Pt will be out of meds in 30 - 60 days. Pt cannot get refills until she shes Dr Raliegh Ip. Pt needs cpe, coming in fasting.  No am cpe until 06/2016.  Is it ok to work her in, or schedule for the Nov appointment and refill until then?

## 2016-02-09 NOTE — Telephone Encounter (Signed)
Pt has been sch for 02-14-16

## 2016-02-10 DIAGNOSIS — G4733 Obstructive sleep apnea (adult) (pediatric): Secondary | ICD-10-CM | POA: Diagnosis not present

## 2016-02-10 DIAGNOSIS — G4730 Sleep apnea, unspecified: Secondary | ICD-10-CM | POA: Diagnosis not present

## 2016-02-14 ENCOUNTER — Encounter: Payer: Self-pay | Admitting: Internal Medicine

## 2016-02-14 ENCOUNTER — Ambulatory Visit (INDEPENDENT_AMBULATORY_CARE_PROVIDER_SITE_OTHER): Payer: Medicare Other | Admitting: Internal Medicine

## 2016-02-14 VITALS — BP 122/70 | HR 79 | Temp 98.2°F | Resp 20 | Ht 63.0 in | Wt 202.0 lb

## 2016-02-14 DIAGNOSIS — F329 Major depressive disorder, single episode, unspecified: Secondary | ICD-10-CM

## 2016-02-14 DIAGNOSIS — E785 Hyperlipidemia, unspecified: Secondary | ICD-10-CM

## 2016-02-14 DIAGNOSIS — E039 Hypothyroidism, unspecified: Secondary | ICD-10-CM

## 2016-02-14 DIAGNOSIS — F32A Depression, unspecified: Secondary | ICD-10-CM

## 2016-02-14 LAB — CBC WITH DIFFERENTIAL/PLATELET
BASOS ABS: 0 10*3/uL (ref 0.0–0.1)
Basophils Relative: 0.4 % (ref 0.0–3.0)
EOS ABS: 0.1 10*3/uL (ref 0.0–0.7)
Eosinophils Relative: 0.9 % (ref 0.0–5.0)
HCT: 36.2 % (ref 36.0–46.0)
Hemoglobin: 12.3 g/dL (ref 12.0–15.0)
LYMPHS ABS: 2.1 10*3/uL (ref 0.7–4.0)
LYMPHS PCT: 26.8 % (ref 12.0–46.0)
MCHC: 33.9 g/dL (ref 30.0–36.0)
MCV: 93.8 fl (ref 78.0–100.0)
Monocytes Absolute: 0.6 10*3/uL (ref 0.1–1.0)
Monocytes Relative: 7.8 % (ref 3.0–12.0)
NEUTROS ABS: 5.1 10*3/uL (ref 1.4–7.7)
NEUTROS PCT: 64.1 % (ref 43.0–77.0)
PLATELETS: 200 10*3/uL (ref 150.0–400.0)
RBC: 3.86 Mil/uL — AB (ref 3.87–5.11)
RDW: 13.9 % (ref 11.5–15.5)
WBC: 7.9 10*3/uL (ref 4.0–10.5)

## 2016-02-14 LAB — COMPREHENSIVE METABOLIC PANEL
ALK PHOS: 51 U/L (ref 39–117)
ALT: 20 U/L (ref 0–35)
AST: 18 U/L (ref 0–37)
Albumin: 4 g/dL (ref 3.5–5.2)
BILIRUBIN TOTAL: 0.3 mg/dL (ref 0.2–1.2)
BUN: 17 mg/dL (ref 6–23)
CALCIUM: 9.1 mg/dL (ref 8.4–10.5)
CO2: 29 meq/L (ref 19–32)
CREATININE: 0.65 mg/dL (ref 0.40–1.20)
Chloride: 107 mEq/L (ref 96–112)
GFR: 93.43 mL/min (ref >60.00)
Glucose, Bld: 88 mg/dL (ref 70–99)
Potassium: 3.9 mEq/L (ref 3.5–5.1)
Sodium: 143 mEq/L (ref 135–145)
TOTAL PROTEIN: 6.7 g/dL (ref 6.0–8.3)

## 2016-02-14 LAB — LIPID PANEL
CHOL/HDL RATIO: 5
CHOLESTEROL: 180 mg/dL (ref 0–200)
HDL: 38.5 mg/dL — AB (ref >39.00)
NonHDL: 141.84
TRIGLYCERIDES: 333 mg/dL — AB (ref 0.0–149.0)
VLDL: 66.6 mg/dL — AB (ref 0.0–40.0)

## 2016-02-14 LAB — TSH: TSH: 3.08 u[IU]/mL (ref 0.35–4.50)

## 2016-02-14 LAB — LDL CHOLESTEROL, DIRECT: LDL DIRECT: 111 mg/dL

## 2016-02-14 MED ORDER — LEVOTHYROXINE SODIUM 100 MCG PO TABS: ORAL_TABLET | ORAL | Status: DC

## 2016-02-14 MED ORDER — ATORVASTATIN CALCIUM 10 MG PO TABS: 10.0000 mg | ORAL_TABLET | Freq: Every day | ORAL | Status: DC

## 2016-02-14 NOTE — Progress Notes (Signed)
Pre visit review using our clinic review tool, if applicable. No additional management support is needed unless otherwise documented below in the visit note. 

## 2016-02-14 NOTE — Patient Instructions (Signed)
It is important that you exercise regularly, at least 20 minutes 3 to 4 times per week.  If you develop chest pain or shortness of breath seek  medical attention.  You need to lose weight.  Consider a lower calorie diet and regular exercise.  Return in 6 months for follow-up  

## 2016-02-14 NOTE — Progress Notes (Signed)
Subjective:    Patient ID: Emily Castaneda, female    DOB: 1935-06-16, 81 y.o.   MRN: [id number]  HPI 81 year old patient who has hypothyroidism, dyslipidemia.  She has a history of diverticulosis.  She is doing quite well today.  Her pharmacist suggested it was time for a follow-up office visit.  She was seen here 3-4 months ago for diarrhea.  This has resolved on a high fiber diet.  Doing well today  Past Medical History  Diagnosis Date  . Hx of colonic polyps   . Depression     Dr. Abner Greenspan  . Diverticulosis of colon   . Hyperlipidemia   . Hypothyroidism   . Menopausal syndrome   . OSA (obstructive sleep apnea)     cpap; 17 cm (clint young)  . Endometriosis   . Manic, depressive ([place])      Social History   Social History  . Marital Status: Married    Spouse Name: N/A  . Number of Children: N/A  . Years of Education: N/A   Occupational History  . retired    Social History Main Topics  . Smoking status: Former Smoker -- 0.10 packs/day for 10 years    Types: Cigarettes    Quit date: 08/21/1983  . Smokeless tobacco: Never Used     Comment: only smoked socially  . Alcohol Use: 0.6 - 1.2 oz/week    1-2 Standard drinks or equivalent per week     Comment: occ glass of wine   . Drug Use: No  . Sexual Activity: Not on file   Other Topics Concern  . Not on file   Social History Narrative    Past Surgical History  Procedure Laterality Date  . Polypectomy      Dr. Ubaldo Glassing  . Colonoscopy    . Appendectomy    . Tonsillectomy and adenoidectomy      Family History  Problem Relation Age of Onset  . Diabetes Mother   . Heart disease Father     complications of coronary heart disease  . Heart disease Other   . Cancer Other     BREAST AND LUNG   . Diabetes Brother   . Colon cancer Neg Hx   . Rectal cancer Neg Hx   . Stomach cancer Neg Hx   . Breast cancer Maternal Aunt   . Cancer Maternal Grandfather     LUNG  . Breast cancer Daughter     No Known  Allergies  Current Outpatient Prescriptions on File Prior to Visit  Medication Sig Dispense Refill  . cholecalciferol (VITAMIN D) 1000 UNITS tablet Take 1,000 Units by mouth daily.    . clonazePAM (KLONOPIN) 1 MG tablet Take 1 mg by mouth at bedtime.     . divalproex (DEPAKOTE ER) 250 MG 24 hr tablet Take 1,250 mg by mouth every evening.    . eszopiclone (LUNESTA) 2 MG TABS Take 2 mg by mouth at bedtime as needed for sleep. Take immediately before bedtime     No current facility-administered medications on file prior to visit.    BP 122/70 mmHg  Pulse 79  Temp(Src) 98.2 F (36.8 C) (Oral)  Resp 20  Ht 5\' 3"  (1.6 m)  Wt 202 lb (91.627 kg)  BMI 35.79 kg/m2  SpO2 98%      Review of Systems  Constitutional: Negative.   HENT: Negative for congestion, dental problem, hearing loss, rhinorrhea, sinus pressure, sore throat and tinnitus.   Eyes: Negative for pain, discharge  and visual disturbance.  Respiratory: Negative for cough and shortness of breath.   Cardiovascular: Negative for chest pain, palpitations and leg swelling.  Gastrointestinal: Positive for diarrhea. Negative for nausea, vomiting, abdominal pain, constipation, blood in stool and abdominal distention.  Genitourinary: Negative for dysuria, urgency, frequency, hematuria, flank pain, vaginal bleeding, vaginal discharge, difficulty urinating, vaginal pain and pelvic pain.  Musculoskeletal: Negative for joint swelling, arthralgias and gait problem.  Skin: Negative for rash.  Neurological: Negative for dizziness, syncope, speech difficulty, weakness, numbness and headaches.  Hematological: Negative for adenopathy.  Psychiatric/Behavioral: Negative for behavioral problems, dysphoric mood and agitation. The patient is not nervous/anxious.        Objective:   Physical Exam  Constitutional: She is oriented to person, place, and time. She appears well-developed and well-nourished.  HENT:  Head: Normocephalic.  Right Ear:  External ear normal.  Left Ear: External ear normal.  Mouth/Throat: Oropharynx is clear and moist.  Eyes: Conjunctivae and EOM are normal. Pupils are equal, round, and reactive to light.  Neck: Normal range of motion. Neck supple. No thyromegaly present.  Cardiovascular: Normal rate, regular rhythm, normal heart sounds and intact distal pulses.   Pulmonary/Chest: Effort normal and breath sounds normal.  Abdominal: Soft. Bowel sounds are normal. She exhibits no mass. There is no tenderness.  Musculoskeletal: Normal range of motion.  Lymphadenopathy:    She has no cervical adenopathy.  Neurological: She is alert and oriented to person, place, and time.  Skin: Skin is warm and dry. No rash noted.  Psychiatric: She has a normal mood and affect. Her behavior is normal.          Assessment & Plan:   Hypothyroidism Dyslipidemia History depression Diverticulosis  Medications updated We'll check screening lab CPX 6 months   Nyoka Cowden, MD

## 2016-03-09 ENCOUNTER — Other Ambulatory Visit: Payer: Self-pay | Admitting: Internal Medicine

## 2016-03-15 ENCOUNTER — Other Ambulatory Visit: Payer: Self-pay | Admitting: Internal Medicine

## 2016-06-06 DIAGNOSIS — Z803 Family history of malignant neoplasm of breast: Secondary | ICD-10-CM | POA: Diagnosis not present

## 2016-06-06 DIAGNOSIS — Z1231 Encounter for screening mammogram for malignant neoplasm of breast: Secondary | ICD-10-CM | POA: Diagnosis not present

## 2016-06-06 LAB — HM MAMMOGRAPHY

## 2016-06-08 ENCOUNTER — Encounter: Payer: Self-pay | Admitting: Internal Medicine

## 2016-06-13 ENCOUNTER — Other Ambulatory Visit: Payer: Self-pay | Admitting: Internal Medicine

## 2016-07-31 ENCOUNTER — Other Ambulatory Visit (INDEPENDENT_AMBULATORY_CARE_PROVIDER_SITE_OTHER): Payer: Medicare Other

## 2016-07-31 DIAGNOSIS — Z0000 Encounter for general adult medical examination without abnormal findings: Secondary | ICD-10-CM | POA: Diagnosis not present

## 2016-07-31 DIAGNOSIS — R7989 Other specified abnormal findings of blood chemistry: Secondary | ICD-10-CM

## 2016-07-31 LAB — LIPID PANEL
CHOL/HDL RATIO: 5
CHOLESTEROL: 202 mg/dL — AB (ref 0–200)
HDL: 42.7 mg/dL (ref >39.00)
NonHDL: 159.21
Triglycerides: 383 mg/dL — ABNORMAL HIGH (ref 0.0–149.0)
VLDL: 76.6 mg/dL — AB (ref 0.0–40.0)

## 2016-07-31 LAB — CBC WITH DIFFERENTIAL/PLATELET
BASOS ABS: 0 10*3/uL (ref 0.0–0.1)
Basophils Relative: 0.4 % (ref 0.0–3.0)
EOS ABS: 0.1 10*3/uL (ref 0.0–0.7)
Eosinophils Relative: 0.9 % (ref 0.0–5.0)
HEMATOCRIT: 36.6 % (ref 36.0–46.0)
HEMOGLOBIN: 12.4 g/dL (ref 12.0–15.0)
LYMPHS PCT: 32.6 % (ref 12.0–46.0)
Lymphs Abs: 2.2 10*3/uL (ref 0.7–4.0)
MCHC: 34.1 g/dL (ref 30.0–36.0)
MCV: 94.6 fl (ref 78.0–100.0)
MONOS PCT: 8.2 % (ref 3.0–12.0)
Monocytes Absolute: 0.6 10*3/uL (ref 0.1–1.0)
NEUTROS ABS: 3.9 10*3/uL (ref 1.4–7.7)
Neutrophils Relative %: 57.9 % (ref 43.0–77.0)
Platelets: 179 10*3/uL (ref 150.0–400.0)
RBC: 3.86 Mil/uL — ABNORMAL LOW (ref 3.87–5.11)
RDW: 14.1 % (ref 11.5–15.5)
WBC: 6.7 10*3/uL (ref 4.0–10.5)

## 2016-07-31 LAB — BASIC METABOLIC PANEL
BUN: 16 mg/dL (ref 6–23)
CHLORIDE: 105 meq/L (ref 96–112)
CO2: 27 mEq/L (ref 19–32)
Calcium: 9.3 mg/dL (ref 8.4–10.5)
Creatinine, Ser: 0.67 mg/dL (ref 0.40–1.20)
GFR: 90.12 mL/min (ref >60.00)
GLUCOSE: 124 mg/dL — AB (ref 70–99)
POTASSIUM: 4 meq/L (ref 3.5–5.1)
SODIUM: 142 meq/L (ref 135–145)

## 2016-07-31 LAB — HEPATIC FUNCTION PANEL
ALBUMIN: 4 g/dL (ref 3.5–5.2)
ALK PHOS: 45 U/L (ref 39–117)
ALT: 16 U/L (ref 0–35)
AST: 17 U/L (ref 0–37)
BILIRUBIN TOTAL: 0.3 mg/dL (ref 0.2–1.2)
Bilirubin, Direct: 0.1 mg/dL (ref 0.0–0.3)
Total Protein: 6.4 g/dL (ref 6.0–8.3)

## 2016-07-31 LAB — TSH: TSH: 4.46 u[IU]/mL (ref 0.35–4.50)

## 2016-07-31 LAB — LDL CHOLESTEROL, DIRECT: LDL DIRECT: 114 mg/dL

## 2016-08-01 LAB — POC URINALSYSI DIPSTICK (AUTOMATED)
BILIRUBIN UA: NEGATIVE
Blood, UA: NEGATIVE
GLUCOSE UA: NEGATIVE
LEUKOCYTES UA: NEGATIVE
Nitrite, UA: NEGATIVE
Protein, UA: NEGATIVE
SPEC GRAV UA: 1.025
Urobilinogen, UA: 0.2
pH, UA: 6

## 2016-08-07 ENCOUNTER — Ambulatory Visit (INDEPENDENT_AMBULATORY_CARE_PROVIDER_SITE_OTHER): Payer: Medicare Other | Admitting: Internal Medicine

## 2016-08-07 ENCOUNTER — Encounter: Payer: Self-pay | Admitting: Internal Medicine

## 2016-08-07 VITALS — BP 130/76 | HR 100 | Temp 98.0°F | Ht 61.5 in | Wt 206.4 lb

## 2016-08-07 DIAGNOSIS — Z23 Encounter for immunization: Secondary | ICD-10-CM

## 2016-08-07 DIAGNOSIS — Z0000 Encounter for general adult medical examination without abnormal findings: Secondary | ICD-10-CM

## 2016-08-07 NOTE — Progress Notes (Signed)
Pre visit review using our clinic review tool, if applicable. No additional management support is needed unless otherwise documented below in the visit note. 

## 2016-08-07 NOTE — Patient Instructions (Addendum)
Limit your sodium (Salt) intake    It is important that you exercise regularly, at least 20 minutes 3 to 4 times per week.  If you develop chest pain or shortness of breath seek  medical attention.  You need to lose weight.  Consider a lower calorie diet and regular exercise.  Take a calcium supplement, plus [phone] units of vitamin D   Health Maintenance for Postmenopausal Women Introduction Menopause is a normal process in which your reproductive ability comes to an end. This process happens gradually over a span of months to years, usually between the ages of 35 and 25. Menopause is complete when you have missed 12 consecutive menstrual periods. It is important to talk with your health care provider about some of the most common conditions that affect postmenopausal women, such as heart disease, cancer, and bone loss (osteoporosis). Adopting a healthy lifestyle and getting preventive care can help to promote your health and wellness. Those actions can also lower your chances of developing some of these common conditions. What should I know about menopause? During menopause, you may experience a number of symptoms, such as:  Moderate-to-severe hot flashes.  Night sweats.  Decrease in sex drive.  Mood swings.  Headaches.  Tiredness.  Irritability.  Memory problems.  Insomnia. Choosing to treat or not to treat menopausal changes is an individual decision that you make with your health care provider. What should I know about hormone replacement therapy and supplements? Hormone therapy products are effective for treating symptoms that are associated with menopause, such as hot flashes and night sweats. Hormone replacement carries certain risks, especially as you become older. If you are thinking about using estrogen or estrogen with progestin treatments, discuss the benefits and risks with your health care provider. What should I know about heart disease and stroke? Heart disease,  heart attack, and stroke become more likely as you age. This may be due, in part, to the hormonal changes that your body experiences during menopause. These can affect how your body processes dietary fats, triglycerides, and cholesterol. Heart attack and stroke are both medical emergencies. There are many things that you can do to help prevent heart disease and stroke:  Have your blood pressure checked at least every 1-2 years. High blood pressure causes heart disease and increases the risk of stroke.  If you are 64-34 years old, ask your health care provider if you should take aspirin to prevent a heart attack or a stroke.  Do not use any tobacco products, including cigarettes, chewing tobacco, or electronic cigarettes. If you need help quitting, ask your health care provider.  It is important to eat a healthy diet and maintain a healthy weight.  Be sure to include plenty of vegetables, fruits, low-fat dairy products, and lean protein.  Avoid eating foods that are high in solid fats, added sugars, or salt (sodium).  Get regular exercise. This is one of the most important things that you can do for your health.  Try to exercise for at least 150 minutes each week. The type of exercise that you do should increase your heart rate and make you sweat. This is known as moderate-intensity exercise.  Try to do strengthening exercises at least twice each week. Do these in addition to the moderate-intensity exercise.  Know your numbers.Ask your health care provider to check your cholesterol and your blood glucose. Continue to have your blood tested as directed by your health care provider. What should I know about cancer screening? There  are several types of cancer. Take the following steps to reduce your risk and to catch any cancer development as early as possible. Breast Cancer  Practice breast self-awareness.  This means understanding how your breasts normally appear and feel.  It also means  doing regular breast self-exams. Let your health care provider know about any changes, no matter how small.  If you are 41 or older, have a clinician do a breast exam (clinical breast exam or CBE) every year. Depending on your age, family history, and medical history, it may be recommended that you also have a yearly breast X-ray (mammogram).  If you have a family history of breast cancer, talk with your health care provider about genetic screening.  If you are at high risk for breast cancer, talk with your health care provider about having an MRI and a mammogram every year.  Breast cancer (BRCA) gene test is recommended for women who have family members with BRCA-related cancers. Results of the assessment will determine the need for genetic counseling and BRCA1 and for BRCA2 testing. BRCA-related cancers include these types:  Breast. This occurs in males or females.  Ovarian.  Tubal. This may also be called fallopian tube cancer.  Cancer of the abdominal or pelvic lining (peritoneal cancer).  Prostate.  Pancreatic. Cervical, Uterine, and Ovarian Cancer  Your health care provider may recommend that you be screened regularly for cancer of the pelvic organs. These include your ovaries, uterus, and vagina. This screening involves a pelvic exam, which includes checking for microscopic changes to the surface of your cervix (Pap test).  For women ages 21-65, health care providers may recommend a pelvic exam and a Pap test every three years. For women ages 50-65, they may recommend the Pap test and pelvic exam, combined with testing for human papilloma virus (HPV), every five years. Some types of HPV increase your risk of cervical cancer. Testing for HPV may also be done on women of any age who have unclear Pap test results.  Other health care providers may not recommend any screening for nonpregnant women who are considered low risk for pelvic cancer and have no symptoms. Ask your health care  provider if a screening pelvic exam is right for you.  If you have had past treatment for cervical cancer or a condition that could lead to cancer, you need Pap tests and screening for cancer for at least 20 years after your treatment. If Pap tests have been discontinued for you, your risk factors (such as having a new sexual partner) need to be reassessed to determine if you should start having screenings again. Some women have medical problems that increase the chance of getting cervical cancer. In these cases, your health care provider may recommend that you have screening and Pap tests more often.  If you have a family history of uterine cancer or ovarian cancer, talk with your health care provider about genetic screening.  If you have vaginal bleeding after reaching menopause, tell your health care provider.  There are currently no reliable tests available to screen for ovarian cancer. Lung Cancer  Lung cancer screening is recommended for adults 52-72 years old who are at high risk for lung cancer because of a history of smoking. A yearly low-dose CT scan of the lungs is recommended if you:  Currently smoke.  Have a history of at least 30 pack-years of smoking and you currently smoke or have quit within the past 15 years. A pack-year is smoking an average  of one pack of cigarettes per day for one year. Yearly screening should:  Continue until it has been 15 years since you quit.  Stop if you develop a health problem that would prevent you from having lung cancer treatment. Colorectal Cancer  This type of cancer can be detected and can often be prevented.  Routine colorectal cancer screening usually begins at age 51 and continues through age 46.  If you have risk factors for colon cancer, your health care provider may recommend that you be screened at an earlier age.  If you have a family history of colorectal cancer, talk with your health care provider about genetic  screening.  Your health care provider may also recommend using home test kits to check for hidden blood in your stool.  A small camera at the end of a tube can be used to examine your colon directly (sigmoidoscopy or colonoscopy). This is done to check for the earliest forms of colorectal cancer.  Direct examination of the colon should be repeated every 5-10 years until age 25. However, if early forms of precancerous polyps or small growths are found or if you have a family history or genetic risk for colorectal cancer, you may need to be screened more often. Skin Cancer  Check your skin from head to toe regularly.  Monitor any moles. Be sure to tell your health care provider:  About any new moles or changes in moles, especially if there is a change in a mole's shape or color.  If you have a mole that is larger than the size of a pencil eraser.  If any of your family members has a history of skin cancer, especially at a young age, talk with your health care provider about genetic screening.  Always use sunscreen. Apply sunscreen liberally and repeatedly throughout the day.  Whenever you are outside, protect yourself by wearing long sleeves, pants, a wide-brimmed hat, and sunglasses. What should I know about osteoporosis? Osteoporosis is a condition in which bone destruction happens more quickly than new bone creation. After menopause, you may be at an increased risk for osteoporosis. To help prevent osteoporosis or the bone fractures that can happen because of osteoporosis, the following is recommended:  If you are 14-105 years old, get at least 1,000 mg of calcium and at least 600 mg of vitamin D per day.  If you are older than age 56 but younger than age 38, get at least 1,200 mg of calcium and at least 600 mg of vitamin D per day.  If you are older than age 30, get at least 1,200 mg of calcium and at least 800 mg of vitamin D per day. Smoking and excessive alcohol intake increase the  risk of osteoporosis. Eat foods that are rich in calcium and vitamin D, and do weight-bearing exercises several times each week as directed by your health care provider. What should I know about how menopause affects my mental health? Depression may occur at any age, but it is more common as you become older. Common symptoms of depression include:  Low or sad mood.  Changes in sleep patterns.  Changes in appetite or eating patterns.  Feeling an overall lack of motivation or enjoyment of activities that you previously enjoyed.  Frequent crying spells. Talk with your health care provider if you think that you are experiencing depression. What should I know about immunizations? It is important that you get and maintain your immunizations. These include:  Tetanus, diphtheria, and pertussis (  Tdap) booster vaccine.  Influenza every year before the flu season begins.  Pneumonia vaccine.  Shingles vaccine. Your health care provider may also recommend other immunizations. This information is not intended to replace advice given to you by your health care provider. Make sure you discuss any questions you have with your health care provider. Document Released: 09/28/2005 Document Revised: 02/24/2016 Document Reviewed: 05/10/2015  2017 [employer]

## 2016-08-07 NOTE — Progress Notes (Signed)
Subjective:    Patient ID: Emily Castaneda, female    DOB: 05-13-1935, 81 y.o.   MRN: [id number]  HPI 81 year old patient who is seen today for a preventive health examination and annual Medicare wellness visit.  She has a history of hypothyroidism and remains on supplemental levothyroxine.  She has dyslipidemia, controlled with atorvastatin. Medical issues include OSA and also a history of major depression/bipolar disorder.  She is followed by psychiatry and remains on Klonopin and Depakote  Family history is pertinent for breast cancer, a daughter affected.  Patient has had a recent mammogram.  Laboratory studies reviewed.  Fasting blood sugar 124  Past Medical History:  Diagnosis Date  . Depression    Dr. Abner Greenspan  . Diverticulosis of colon   . Endometriosis   . Hx of colonic polyps   . Hyperlipidemia   . Hypothyroidism   . Manic, depressive ([place])   . Menopausal syndrome   . OSA (obstructive sleep apnea)    cpap; 17 cm (clint young)     Social History   Social History  . Marital status: Married    Spouse name: N/A  . Number of children: N/A  . Years of education: N/A   Occupational History  . retired    Social History Main Topics  . Smoking status: Former Smoker    Packs/day: 0.10    Years: 10.00    Types: Cigarettes    Quit date: 08/21/1983  . Smokeless tobacco: Never Used     Comment: only smoked socially  . Alcohol use 0.6 - 1.2 oz/week    1 - 2 Standard drinks or equivalent per week     Comment: occ glass of wine   . Drug use: No  . Sexual activity: Not on file   Other Topics Concern  . Not on file   Social History Narrative  . No narrative on file    Past Surgical History:  Procedure Laterality Date  . APPENDECTOMY    . COLONOSCOPY    . POLYPECTOMY     Dr. Ubaldo Glassing  . TONSILLECTOMY AND ADENOIDECTOMY      Family History  Problem Relation Age of Onset  . Diabetes Mother   . Heart disease Father     complications of coronary heart disease  .  Heart disease Other   . Cancer Other     BREAST AND LUNG   . Diabetes Brother   . Colon cancer Neg Hx   . Rectal cancer Neg Hx   . Stomach cancer Neg Hx   . Breast cancer Maternal Aunt   . Cancer Maternal Grandfather     LUNG  . Breast cancer Daughter     No Known Allergies  Current Outpatient Prescriptions on File Prior to Visit  Medication Sig Dispense Refill  . atorvastatin (LIPITOR) 10 MG tablet Take 1 tablet (10 mg total) by mouth daily. 90 tablet 1  . atorvastatin (LIPITOR) 10 MG tablet TAKE 1 TABLET BY MOUTH DAILY 90 tablet 0  . cholecalciferol (VITAMIN D) 1000 UNITS tablet Take 1,000 Units by mouth daily.    . clonazePAM (KLONOPIN) 1 MG tablet Take 1 mg by mouth at bedtime.     . divalproex (DEPAKOTE ER) 250 MG 24 hr tablet Take 1,250 mg by mouth every evening.    . eszopiclone (LUNESTA) 2 MG TABS Take 2 mg by mouth at bedtime as needed for sleep. Take immediately before bedtime    . levothyroxine (SYNTHROID, LEVOTHROID) 100 MCG tablet TAKE  1 TABLET BY MOUTH DAILY 90 tablet 1  . levothyroxine (SYNTHROID, LEVOTHROID) 100 MCG tablet TAKE 1 TABLET BY MOUTH DAILY; APPOINTMENT REQUIRED FOR MORE REFILLS 30 tablet 3  . levothyroxine (SYNTHROID, LEVOTHROID) 100 MCG tablet TAKE 1 TABLET BY MOUTH DAILY; APPOINTMENT REQUIRED FOR MORE REFILLS 90 tablet 3  . Multiple Vitamins-Minerals (PRESERVISION AREDS 2) CAPS Take 1 capsule by mouth 2 (two) times daily.     No current facility-administered medications on file prior to visit.     BP 130/76 (BP Location: Right Arm, Patient Position: Sitting, Cuff Size: Normal)   Pulse 100   Temp 98 F (36.7 C) (Oral)   Ht 5' 1.5" (1.562 m)   Wt 206 lb 6.4 oz (93.6 kg)   SpO2 94%   BMI 38.37 kg/m   Medicare wellness visit  1. Risk factors, based on past  M,S,F history.  Current cardiovascular  Risk factors include dyslipidemia and impaired glucose tolerance  2.  Physical activities:fairly sedentary due to arthritis and obesity.  Does  participate infrequently.  In water aerobics  3.  Depression/mood:history bipolar depression followed by psychiatry  4.  Hearing:no deficits  5.  ADL's:independent  6.  Fall risk:moderate due to obesity and age  50.  Home safety:no problems identified  8.  Height weight, and visual acuity;no change in visual acuity  9.  Counseling:we'll attempt more rigorous exercise and weight loss  10. Lab orders based on risk factors:  11. Referral :follow-up psychiatry  12. Care plan:continue heart healthy diet and efforts at weight loss  13. Cognitive assessment: alert and appropriate with normal affect no cognitive dysfunction  14. Screening: Patient provided with a written and personalized 5-10 year screening schedule in the AVS.    15. Provider List Update: [facility]mology radiology and pulmonary medicine     Review of Systems  Constitutional: Negative.   HENT: Negative for congestion, dental problem, hearing loss, rhinorrhea, sinus pressure, sore throat and tinnitus.   Eyes: Negative for pain, discharge and visual disturbance.  Respiratory: Negative for cough and shortness of breath.   Cardiovascular: Negative for chest pain, palpitations and leg swelling.  Gastrointestinal: Negative for abdominal distention, abdominal pain, blood in stool, constipation, diarrhea, nausea and vomiting.  Genitourinary: Negative for difficulty urinating, dysuria, flank pain, frequency, hematuria, pelvic pain, urgency, vaginal bleeding, vaginal discharge and vaginal pain.  Musculoskeletal: Positive for arthralgias. Negative for gait problem and joint swelling.  Skin: Negative for rash.  Neurological: Negative for dizziness, syncope, speech difficulty, weakness, numbness and headaches.  Hematological: Negative for adenopathy.  Psychiatric/Behavioral: Negative for agitation, behavioral problems and dysphoric mood. The patient is not nervous/anxious.        Objective:   Physical  Exam  Constitutional: She is oriented to person, place, and time. She appears well-developed and well-nourished.  Blood pressure well controlled  HENT:  Head: Normocephalic and atraumatic.  Right Ear: External ear normal.  Left Ear: External ear normal.  Mouth/Throat: Oropharynx is clear and moist.  Pharyngeal crowding  Eyes: Conjunctivae and EOM are normal.  Neck: Normal range of motion. Neck supple. No JVD present. No thyromegaly present.  Cardiovascular: Normal rate, regular rhythm, normal heart sounds and intact distal pulses.   No murmur heard. Pulmonary/Chest: Effort normal and breath sounds normal. She has no wheezes. She has no rales.  Abdominal: Soft. Bowel sounds are normal. She exhibits no distension and no mass. There is no tenderness. There is no rebound and no guarding.  Musculoskeletal: Normal range of motion.  She exhibits no edema or tenderness.  Neurological: She is alert and oriented to person, place, and time. She has normal reflexes. No cranial nerve deficit. She exhibits normal muscle tone. Coordination normal.  Slight head and neck tremor  Absent monofilament testing involving the feet  Skin: Skin is warm and dry. No rash noted.  Psychiatric: She has a normal mood and affect. Her behavior is normal.          Assessment & Plan:   Preventive health exam.  High-dose flu vaccine administered Annual Medicare wellness visit OSA Impaired glucose tolerance.  Weight loss encouraged Dyslipidemia.  Continue statin therapy Hypothyroidism.  Continue supplemental levothyroxine Bipolar depression.  Follow-up psychiatry  Return here in 6 months for general follow-up pulmonary follow-up  Wyvonne Carda Pilar Plate

## 2016-08-10 ENCOUNTER — Other Ambulatory Visit: Payer: Medicare Other

## 2016-08-17 ENCOUNTER — Encounter: Payer: Medicare Other | Admitting: Internal Medicine

## 2016-11-04 ENCOUNTER — Encounter: Payer: Self-pay | Admitting: Pulmonary Disease

## 2016-11-04 DIAGNOSIS — R159 Full incontinence of feces: Secondary | ICD-10-CM

## 2016-11-05 ENCOUNTER — Ambulatory Visit (INDEPENDENT_AMBULATORY_CARE_PROVIDER_SITE_OTHER): Payer: Medicare Other | Admitting: Pulmonary Disease

## 2016-11-05 ENCOUNTER — Encounter: Payer: Self-pay | Admitting: Pulmonary Disease

## 2016-11-05 DIAGNOSIS — Z9989 Dependence on other enabling machines and devices: Secondary | ICD-10-CM

## 2016-11-05 DIAGNOSIS — F341 Dysthymic disorder: Secondary | ICD-10-CM

## 2016-11-05 DIAGNOSIS — G4733 Obstructive sleep apnea (adult) (pediatric): Secondary | ICD-10-CM

## 2016-11-05 NOTE — Progress Notes (Signed)
   Subjective:    Patient ID: Emily Castaneda, female    DOB: [birth year], 82 y.o.   MRN: [id number]  HPI   82 yo female  With severe OSA & Bipolar dz  She has been maintained on CPAP 16 cm with nasal pillows with good results since then. She reports sleeping well and wakes up feeling rested, denies nasal stuffiness or dryness of mouth. She denies daytime fatigue or snoring or excessive use of caffeinated beverages. Her weight has been stable. Download today confirms excellent control of events on 16 cm with excellent usage and moderate leak on nasal pillows  She is maintained on clonazepam and Depakote for bipolar disorder and her mood has been stable, denies sadness of mood  Significant tests/ events  PSG 03/2004 showed severe OSA with RDI 75/h corrected by CPAP 17 cm, she used other interfaces before settling on nasal pillows  Past Medical History:  Diagnosis Date  . Depression    Dr. Abner Greenspan  . Diverticulosis of colon   . Endometriosis   . Hx of colonic polyps   . Hyperlipidemia   . Hypothyroidism   . Manic, depressive ([place])   . Menopausal syndrome   . OSA (obstructive sleep apnea)    cpap; 17 cm (clint young)    Review of Systems Patient denies significant dyspnea,cough, hemoptysis,  chest pain, palpitations, pedal edema, orthopnea, paroxysmal nocturnal dyspnea, lightheadedness, nausea, vomiting, abdominal or  leg pains      Objective:   Physical Exam  Gen. Pleasant, well-nourished, in no distress ENT - no lesions, no post nasal drip Neck: No JVD, no thyromegaly, no carotid bruits Lungs: no use of accessory muscles, no dullness to percussion, clear without rales or rhonchi  Cardiovascular: Rhythm regular, heart sounds  normal, no murmurs or gallops, no peripheral edema Musculoskeletal: No deformities, no cyanosis or clubbing        Assessment & Plan:

## 2016-11-05 NOTE — Assessment & Plan Note (Signed)
CPAP supplies will be renewed for a year She is surprisingly tolerating the high pressure very well with nasal pillows. CPAP was certainly help her daytime somnolence and fatigue and she has gotten used to this. She'll qualify for a new machine if this one breaks down  Weight loss encouraged, compliance with goal of at least 4-6 hrs every night is the expectation. Advised against medications with sedative side effects Cautioned against driving when sleepy - understanding that sleepiness will vary on a day to day basis

## 2016-11-05 NOTE — Assessment & Plan Note (Signed)
Mood stable. Correlation with sleep discussed

## 2016-11-05 NOTE — Patient Instructions (Signed)
CPAP supplies will be renewed for a year 

## 2016-12-05 ENCOUNTER — Other Ambulatory Visit: Payer: Self-pay | Admitting: Internal Medicine

## 2016-12-19 ENCOUNTER — Ambulatory Visit: Payer: Self-pay

## 2016-12-19 ENCOUNTER — Other Ambulatory Visit (INDEPENDENT_AMBULATORY_CARE_PROVIDER_SITE_OTHER): Payer: Medicare Other

## 2016-12-19 ENCOUNTER — Encounter: Payer: Self-pay | Admitting: Family Medicine

## 2016-12-19 ENCOUNTER — Ambulatory Visit (INDEPENDENT_AMBULATORY_CARE_PROVIDER_SITE_OTHER): Payer: Medicare Other | Admitting: Family Medicine

## 2016-12-19 VITALS — BP 110/64 | HR 60 | Resp 16 | Ht 61.5 in | Wt 206.0 lb

## 2016-12-19 DIAGNOSIS — M1712 Unilateral primary osteoarthritis, left knee: Secondary | ICD-10-CM

## 2016-12-19 DIAGNOSIS — M25562 Pain in left knee: Secondary | ICD-10-CM

## 2016-12-19 DIAGNOSIS — M353 Polymyalgia rheumatica: Secondary | ICD-10-CM | POA: Diagnosis not present

## 2016-12-19 LAB — CBC WITH DIFFERENTIAL/PLATELET
BASOS PCT: 0.3 % (ref 0.0–3.0)
Basophils Absolute: 0 10*3/uL (ref 0.0–0.1)
EOS PCT: 0.6 % (ref 0.0–5.0)
Eosinophils Absolute: 0.1 10*3/uL (ref 0.0–0.7)
HEMATOCRIT: 36.9 % (ref 36.0–46.0)
Hemoglobin: 12.6 g/dL (ref 12.0–15.0)
LYMPHS PCT: 35.9 % (ref 12.0–46.0)
Lymphs Abs: 2.9 10*3/uL (ref 0.7–4.0)
MCHC: 34.1 g/dL (ref 30.0–36.0)
MCV: 95.9 fl (ref 78.0–100.0)
MONOS PCT: 8.7 % (ref 3.0–12.0)
Monocytes Absolute: 0.7 10*3/uL (ref 0.1–1.0)
NEUTROS ABS: 4.4 10*3/uL (ref 1.4–7.7)
Neutrophils Relative %: 54.5 % (ref 43.0–77.0)
PLATELETS: 169 10*3/uL (ref 150.0–400.0)
RBC: 3.85 Mil/uL — ABNORMAL LOW (ref 3.87–5.11)
RDW: 13.9 % (ref 11.5–15.5)
WBC: 8.1 10*3/uL (ref 4.0–10.5)

## 2016-12-19 LAB — TSH: TSH: 5.46 u[IU]/mL — ABNORMAL HIGH (ref 0.35–4.50)

## 2016-12-19 LAB — IBC PANEL
Iron: 111 ug/dL (ref 42–145)
Saturation Ratios: 35.7 % (ref 20.0–50.0)
Transferrin: 222 mg/dL (ref 212.0–360.0)

## 2016-12-19 LAB — VITAMIN D 25 HYDROXY (VIT D DEFICIENCY, FRACTURES): VITD: 35.02 ng/mL (ref 30.00–100.00)

## 2016-12-19 LAB — CREATININE KINASE MB: CK-MB: 5.4 ng/mL — ABNORMAL HIGH (ref 0.3–4.0)

## 2016-12-19 LAB — SEDIMENTATION RATE: SED RATE: 4 mm/h (ref 0–30)

## 2016-12-19 MED ORDER — VITAMIN D (ERGOCALCIFEROL) 1.25 MG (50000 UNIT) PO CAPS: 50000.0000 [IU] | ORAL_CAPSULE | ORAL | 0 refills | Status: DC

## 2016-12-19 NOTE — Progress Notes (Signed)
[facility] [place], [place] [address] Phone: [phone] Subjective:    I'm seeing this patient by the request  of:  Nyoka Cowden, MD   CC: Left knee pain  DHR:[id number]  Emily Castaneda is a 82 y.o. female coming in with complaint of left knee pain. Patient is also complaining of multiple pains of the back and everywhere else. Patient states that she has a dull throbbing aching pain of whole body. Does state that the left knee seems to be the worse. Worse with going downstairs. Denies any instability. Pain more on the anterior medial aspect of the knee. Does not rib number any true injury. Rates the severity pain is 8 out of 10. Patient also states that most of her pain in the back and legs seem to also be with activity. Rates the severity of 8 out of 10 as well. Patient states that sometimes knee and other joints seem to swell.     Past Medical History:  Diagnosis Date  . Depression    Dr. Abner Greenspan  . Diverticulosis of colon   . Endometriosis   . Hx of colonic polyps   . Hyperlipidemia   . Hypothyroidism   . Manic, depressive ([place])   . Menopausal syndrome   . OSA (obstructive sleep apnea)    cpap; 17 cm (clint young)   Past Surgical History:  Procedure Laterality Date  . APPENDECTOMY    . COLONOSCOPY    . POLYPECTOMY     Dr. Ubaldo Glassing  . TONSILLECTOMY AND ADENOIDECTOMY     Social History   Social History  . Marital status: Married    Spouse name: N/A  . Number of children: N/A  . Years of education: N/A   Occupational History  . retired    Social History Main Topics  . Smoking status: Former Smoker    Packs/day: 0.10    Years: 10.00    Types: Cigarettes    Quit date: 08/21/1983  . Smokeless tobacco: Never Used     Comment: only smoked socially  . Alcohol use 0.6 - 1.2 oz/week    1 - 2 Standard drinks or equivalent per week     Comment: occ glass of wine   . Drug use: No  . Sexual activity: Not Asked    Other Topics Concern  . None   Social History Narrative  . None   No Known Allergies Family History  Problem Relation Age of Onset  . Diabetes Mother   . Heart disease Father     complications of coronary heart disease  . Diabetes Brother   . Heart disease Other   . Cancer Other     BREAST AND LUNG   . Breast cancer Maternal Aunt   . Cancer Maternal Grandfather     LUNG  . Breast cancer Daughter   . Colon cancer Neg Hx   . Rectal cancer Neg Hx   . Stomach cancer Neg Hx     Past medical history, social, surgical and family history all reviewed in electronic medical record.  No pertanent information unless stated regarding to the chief complaint.   Review of Systems:Review of systems updated and as accurate as of 12/19/16  No  visual changes, nausea, vomiting, diarrhea, constipation, dizziness,  skin rash, fevers, chills, night sweats, weight loss, swollen lymph nodes,chest pain, shortness of breath,  Positive headaches, abdominal pain intermittently, body aches, joint swelling, muscle aches positive mood changes  Objective  Blood pressure 110/64, pulse 60, resp. rate 16, height 5' 1.5" (1.562 m), weight 206 lb (93.4 kg), SpO2 96 %. Systems examined below as of 12/19/16   General: No apparent distress alert and oriented x3 mood and affect normal, dressed appropriately.  HEENT: Pupils equal, extraocular movements intact  Respiratory: Patient's speak in full sentences and does not appear short of breath  Cardiovascular: No lower extremity edema, non tender, no erythema  Skin: Warm dry intact with no signs of infection or rash on extremities or on axial skeleton.  Abdomen: Soft nontender  Neuro: Cranial nerves II through XII are intact, neurovascularly intact in all extremities with 2+ DTRs and 2+ pulses.  Lymph: No lymphadenopathy of posterior or anterior cervical chain or axillae bilaterally.  Gait Antalgic gait. Blocks with the aid of a walker MSK:  Mild tender with full  range of motion and good stability and symmetric strength and tone of shoulders, elbows, wrist, hip, and ankles bilaterally. Severe arthritic changes of multiple joints Back Exam:  Inspection: Unremarkable  Motion: Flexion 25 deg, Extension 15 deg, Side Bending to 35 deg bilaterally,  Rotation to 35 deg bilaterally  SLR laying: Negative  XSLR laying: Negative  Palpable tenderness: Tender to palpation in the paraspinal musculature of the thoracolumbar and lumbosacral areas mostly. No spinous process tenderness. Diffusely. FABER: negative. Sensory change: Gross sensation intact to all lumbar and sacral dermatomes.  Reflexes: 2+ at both patellar tendons, 2+ at achilles tendons, Babinski's downgoing.  Strength at foot  4-5 strength but symmetric  Knee: Left valgus deformity noted. Large thigh to calf ratio.  Tender to palpation over medial and PF joint line.  ROM full in flexion and extension and lower leg rotation. instability with valgus force.  painful patellar compression. Patellar glide with moderate crepitus. Patellar and quadriceps tendons unremarkable. Hamstring and quadriceps strength is normal. Contralateral knee shows mild instability and arthritic changes well but nontender  Procedure: Limited ultrasound of left knee Device: GE logiq Q7 Findings: Patient does have significant narrowing of the medial compartment. Mild narrowing of the patellofemoral to permit. Ligaments seem to be intact. Impression: Moderate to severe arthritis Images permanently stored in the unit and are available for review.     Impression and Recommendations:     This case required medical decision making of moderate complexity.      Note: This dictation was prepared with Dragon dictation along with smaller phrase technology. Any transcriptional errors that result from this process are unintentional.

## 2016-12-19 NOTE — Progress Notes (Signed)
Pre-visit discussion using our clinic review tool. No additional management support is needed unless otherwise documented below in the visit note.  

## 2016-12-19 NOTE — Patient Instructions (Addendum)
Good to see you.  Ice 20 minutes 2 times daily. Usually after activity and before bed. Once weekly vitamin D for 12 weeks COnsider decrease the lipitor and see how you feel.  Labs downstairs today  Your knee is arthritis and lets try  Turmeric 500mg  daily to 2 times daily  Tart cherry extract any dose at night I want to see you again in 4 weeks and if not better we could try injections.

## 2016-12-19 NOTE — Assessment & Plan Note (Signed)
Patient is having significant aches and pains. Seems to be a migratory. We'll get laboratory workup to rule out any other deficiencies or abnormalities that could be contribute in. Patient has had hypothyroidism previously that can also be a concern. Patient is on a statin and we'll consider checking creatinine kinase. Patient will follow-up with me again in 4 weeks and we'll discuss further treatment options if necessary.

## 2016-12-19 NOTE — Assessment & Plan Note (Signed)
Degenerative changes of the knee. Moderate to severe in severity. We discussed that if any increase instability and bracing could be beneficial. Patient given trial of topical anti-inflammatories, we discussed over-the-counter medications, we discussed which activities to do. We discussed proper shoes as well as weight loss. Follow-up again in 4 weeks

## 2016-12-20 LAB — ANTI-NUCLEAR AB-TITER (ANA TITER)

## 2016-12-20 LAB — CYCLIC CITRUL PEPTIDE ANTIBODY, IGG

## 2016-12-20 LAB — RHEUMATOID FACTOR

## 2016-12-20 LAB — ANA: Anti Nuclear Antibody(ANA): POSITIVE — AB

## 2016-12-21 ENCOUNTER — Telehealth: Payer: Self-pay

## 2016-12-21 ENCOUNTER — Other Ambulatory Visit: Payer: Self-pay | Admitting: Family Medicine

## 2016-12-21 MED ORDER — LEVOTHYROXINE SODIUM 112 MCG PO TABS: ORAL_TABLET | ORAL | 1 refills | Status: DC

## 2016-12-21 NOTE — Telephone Encounter (Signed)
-----   Message from Lyndal Pulley, DO sent at 12/21/2016  7:36 AM EDT ----- Can you call her.  Tell her thyroid is out of whack.  Will increase thyroid medicine to 152mcg, already sent it in  Can help with a lot of the pain and should retest again in 3 months.  ----- Message ----- From: Interface, Lab In Three Zero One Sent: 12/19/2016   1:22 PM To: Lyndal Pulley, DO

## 2016-12-21 NOTE — Telephone Encounter (Signed)
Spoke with patient in regards to new Rx sent in per Dr. Tamala Julian.

## 2017-01-02 ENCOUNTER — Encounter: Payer: Self-pay | Admitting: Gynecology

## 2017-01-17 NOTE — Progress Notes (Signed)
[facility] [place] [place], [place] [address] Phone: [phone] Subjective:     CC: Left knee follow-up  AOZ:[id number]  Emily Castaneda is a 82 y.o. female coming in with complaint of left knee pain. Patient describes the pain as a dull, throbbing aching pain. Was found to have degenerative arthritis of the knee. Patient states that the knee is feeling 100% better. States that all her aches and pains or significant improvement as well as her mood. This is been which she feels is the increased dose of the thyroid medication. Patient is very happy with the results of far.     Past Medical History:  Diagnosis Date  . Depression    Dr. Abner Greenspan  . Diverticulosis of colon   . Endometriosis   . Hx of colonic polyps   . Hyperlipidemia   . Hypothyroidism   . Manic, depressive ([place])   . Menopausal syndrome   . OSA (obstructive sleep apnea)    cpap; 17 cm (clint young)   Past Surgical History:  Procedure Laterality Date  . APPENDECTOMY    . COLONOSCOPY    . POLYPECTOMY     Dr. Ubaldo Glassing  . TONSILLECTOMY AND ADENOIDECTOMY     Social History   Social History  . Marital status: Married    Spouse name: N/A  . Number of children: N/A  . Years of education: N/A   Occupational History  . retired    Social History Main Topics  . Smoking status: Former Smoker    Packs/day: 0.10    Years: 10.00    Types: Cigarettes    Quit date: 08/21/1983  . Smokeless tobacco: Never Used     Comment: only smoked socially  . Alcohol use 0.6 - 1.2 oz/week    1 - 2 Standard drinks or equivalent per week     Comment: occ glass of wine   . Drug use: No  . Sexual activity: Not on file   Other Topics Concern  . Not on file   Social History Narrative  . No narrative on file   No Known Allergies Family History  Problem Relation Age of Onset  . Diabetes Mother   . Heart disease Father        complications of coronary heart disease  . Diabetes Brother   .  Heart disease Other   . Cancer Other        BREAST AND LUNG   . Breast cancer Maternal Aunt   . Cancer Maternal Grandfather        LUNG  . Breast cancer Daughter   . Colon cancer Neg Hx   . Rectal cancer Neg Hx   . Stomach cancer Neg Hx     Past medical history, social, surgical and family history all reviewed in electronic medical record.  No pertanent information unless stated regarding to the chief complaint.   Review of Systems:Review of systems updated and as accurate as of 01/17/17  No headache, visual changes, nausea, vomiting, diarrhea, constipation, dizziness, abdominal pain, skin rash, fevers, chills, night sweats, weight loss, swollen lymph nodes, body aches, joint swelling, muscle aches, chest pain, shortness of breath, mood changes.   Objective  There were no vitals taken for this visit. Systems examined below as of 01/17/17   General: No apparent distress alert and oriented x3 mood and affect normal, dressed appropriately.  HEENT: Pupils equal, extraocular movements intact  Respiratory: Patient's speak in full sentences and does  not appear short of breath  Cardiovascular: No lower extremity edema, non tender, no erythema  Skin: Warm dry intact with no signs of infection or rash on extremities or on axial skeleton.  Abdomen: Soft nontender  Neuro: Cranial nerves II through XII are intact, neurovascularly intact in all extremities with 2+ DTRs and 2+ pulses.  Lymph: No lymphadenopathy of posterior or anterior cervical chain or axillae bilaterally.  Gait Antalgic gait MSK:  Mild tender with full range of motion and good stability and symmetric strength and tone of shoulders, elbows, wrist, hip, and ankles bilaterally. Severe arthritic changes of multiple joints Knee: Left valgus deformity noted. Large thigh to calf ratio.  Tender to palpation over medial and PF joint line.  ROM full in flexion and extension and lower leg rotation. instability with valgus force still  noted  Mild painful patellar compression. Patellar glide with mild crepitus. Patellar and quadriceps tendons unremarkable. Hamstring and quadriceps strength is normal. Contralateral knee shows arthritic changes but non-tender   Impression and Recommendations:     This case required medical decision making of moderate complexity.      Note: This dictation was prepared with Dragon dictation along with smaller phrase technology. Any transcriptional errors that result from this process are unintentional.

## 2017-01-18 ENCOUNTER — Encounter: Payer: Self-pay | Admitting: Family Medicine

## 2017-01-18 ENCOUNTER — Ambulatory Visit (INDEPENDENT_AMBULATORY_CARE_PROVIDER_SITE_OTHER): Payer: Medicare Other | Admitting: Family Medicine

## 2017-01-18 VITALS — BP 120/76 | HR 92 | Ht 61.5 in | Wt 204.0 lb

## 2017-01-18 DIAGNOSIS — M1712 Unilateral primary osteoarthritis, left knee: Secondary | ICD-10-CM | POA: Diagnosis not present

## 2017-01-18 DIAGNOSIS — M25551 Pain in right hip: Secondary | ICD-10-CM

## 2017-01-18 NOTE — Assessment & Plan Note (Signed)
Patient is improving with conservative therapy. Seem to do better with her hypothyroidism 2. Patient is feeling like herself. Patient follow-up as needed worsening symptoms consider injection

## 2017-01-18 NOTE — Patient Instructions (Signed)
Great to see you  Lets continue the same dose of your medicine You will do a total of 12 weeks of the once weekly vitamin D then switch to a daily vitamin D of 2000 IU thereafter.  Ice your knee after walking.  If in pain try pennsaid pinkie amount topically 2 times daily as needed.   See me again when you need me!

## 2017-03-04 ENCOUNTER — Encounter: Payer: Self-pay | Admitting: Gastroenterology

## 2017-03-04 ENCOUNTER — Ambulatory Visit (INDEPENDENT_AMBULATORY_CARE_PROVIDER_SITE_OTHER): Payer: Medicare Other | Admitting: Gastroenterology

## 2017-03-04 ENCOUNTER — Encounter (INDEPENDENT_AMBULATORY_CARE_PROVIDER_SITE_OTHER): Payer: Self-pay

## 2017-03-04 VITALS — BP 146/82 | HR 113 | Ht 61.5 in | Wt 201.0 lb

## 2017-03-04 DIAGNOSIS — R152 Fecal urgency: Secondary | ICD-10-CM

## 2017-03-04 DIAGNOSIS — K529 Noninfective gastroenteritis and colitis, unspecified: Secondary | ICD-10-CM | POA: Diagnosis not present

## 2017-03-04 DIAGNOSIS — R159 Full incontinence of feces: Secondary | ICD-10-CM | POA: Diagnosis not present

## 2017-03-04 NOTE — Patient Instructions (Addendum)
If you are age 80 or older, your body mass index should be between 23-30. Your Body mass index is 37.36 kg/m. If this is out of the aforementioned range listed, please consider follow up with your Primary Care Provider.  If you are age 15 or younger, your body mass index should be between 19-25. Your Body mass index is 37.36 kg/m. If this is out of the aformentioned range listed, please consider follow up with your Primary Care Provider.    Food Guidelines for gas/bloating/diarrhea:  Many people have difficulty digesting certain foods, causing a variety of distressing and embarrassing symptoms such as abdominal pain, bloating and gas.  These foods may need to be avoided or consumed in small amounts.  Here are some tips that might be helpful for you.  1.   Lactose intolerance is the difficulty or complete inability to digest lactose, the natural sugar in milk and anything made from milk.  This condition is harmless, common, and can begin any time during life.  Some people can digest a modest amount of lactose while others cannot tolerate any.  Also, not all dairy products contain equal amounts of lactose.  For example, hard cheeses such as parmesan have less lactose than soft cheeses such as cheddar.  Yogurt has less lactose than milk or cheese.  Many packaged foods (even many brands of bread) have milk, so read ingredient lists carefully.  It is difficult to test for lactose intolerance, so just try avoiding lactose as much as possible for a week and see what happens with your symptoms.  If you seem to be lactose intolerant, the best plan is to avoid it (but make sure you get calcium from another source).  The next best thing is to use lactase enzyme supplements, available over the counter everywhere.  Just know that many lactose intolerant people need to take several tablets with each serving of dairy to avoid symptoms.  Lastly, a lot of restaurant food is made with milk or butter.  Many are things you  might not suspect, such as mashed potatoes, rice and pasta (cooked with butter) and "grilled" items.  If you are lactose intolerant, it never hurts to ask your server what has milk or butter.  2.   Fiber is an important part of your diet, but not all fiber is well-tolerated.  Insoluble fiber such as bran is often consumed by normal gut bacteria and converted into gas.  Soluble fiber such as oats, squash, carrots and green beans are typically tolerated better.  3.   Some types of carbohydrates can be poorly digested.  Examples include: fructose (apples, cherries, pears, raisins and other dried fruits), fructans (onions, zucchini, large amounts of wheat), sorbitol/mannitol/xylitol and sucralose/Splenda (common artificial sweeteners), and raffinose (lentils, broccoli, cabbage, asparagus, brussel sprouts, many types of beans).  Do a Development worker, community for [employer] and you will find helpful information. Beano, a dietary supplement, will often help with raffinose-containing foods.  As with lactase tablets, you may need several per serving.  4.   Whenever possible, avoid processed food&meats and chemical additives.  High fructose corn syrup, a common sweetener, may be difficult to digest.  Eggs and soy (comes from the soybean, and added to many foods now) are the other most common bloating/gassy foods.  - Dr. [facility]

## 2017-03-04 NOTE — Progress Notes (Signed)
[facility] Consult Note:  History: Emily Castaneda 03/04/2017  Referring physician: Marletta Lor, MD  Reason for consult/chief complaint: Encopresis (Urgent loose stools and cannot control bowels, denies blood in stool)   Subjective  HPI:  This is an 82 year old woman referred by primary care noted above for episodes of urgency and loose stool. She believes it started several months ago and would happen every week or two, following the same course over that time. Most of the time she has regular formed bowel movements without incontinence. Every week or 2 she will have an episode of urgency with a loose stool and she might have an episode of incontinence if she cannot get to a bathroom in time. She has noticed no clear food or other triggers. Again, she feels well the vast majority of the time and denies chronic abdominal pain, anorexia, rectal bleeding, nausea, vomiting or dysphagia. Merdis reports that her appetite is often fair because "I can just not decide what I want to eat". She does report sometimes eating cream-based soup because she really enjoys it.  ROS:  Review of Systems  Constitutional: Negative for appetite change and unexpected weight change.  HENT: Negative for mouth sores and voice change.   Eyes: Negative for pain and redness.  Respiratory: Negative for cough and shortness of breath.   Cardiovascular: Negative for chest pain and palpitations.  Genitourinary: Negative for dysuria and hematuria.  Musculoskeletal: Positive for arthralgias. Negative for myalgias.  Skin: Negative for pallor and rash.  Neurological: Negative for weakness and headaches.  Hematological: Negative for adenopathy.   Some recent knee pain was much improved after decreasing the dose of the statin medicine and some other supplements given after her visit with Dr. Tamala Julian. She does not take NSAIDs regularly.  Past Medical History: Past Medical History:  Diagnosis  Date  . Depression    Dr. Abner Greenspan  . Diverticulosis of colon   . Endometriosis   . Hx of colonic polyps   . Hyperlipidemia   . Hypothyroidism   . Manic, depressive ([place])   . Menopausal syndrome   . OSA (obstructive sleep apnea)    cpap; 17 cm (clint young)     Past Surgical History: Past Surgical History:  Procedure Laterality Date  . APPENDECTOMY    . COLONOSCOPY    . POLYPECTOMY     Dr. Ubaldo Glassing  . TONSILLECTOMY AND ADENOIDECTOMY       Family History: Family History  Problem Relation Age of Onset  . Diabetes Mother   . Heart disease Father        complications of coronary heart disease  . Diabetes Brother   . Heart disease Other   . Cancer Other        BREAST AND LUNG   . Breast cancer Maternal Aunt   . Cancer Maternal Grandfather        LUNG  . Breast cancer Daughter   . Colon cancer Neg Hx   . Rectal cancer Neg Hx   . Stomach cancer Neg Hx     Social History: Social History   Social History  . Marital status: Married    Spouse name: N/A  . Number of children: N/A  . Years of education: N/A   Occupational History  . retired    Social History Main Topics  . Smoking status: Former Smoker    Packs/day: 0.10    Years: 10.00    Types: Cigarettes    Quit date: 08/21/1983  .  Smokeless tobacco: Never Used     Comment: only smoked socially  . Alcohol use 0.6 - 1.2 oz/week    1 - 2 Standard drinks or equivalent per week     Comment: occ glass of wine   . Drug use: No  . Sexual activity: Not Asked   Other Topics Concern  . None   Social History Narrative  . None    Allergies: No Known Allergies  Outpatient Meds: Current Outpatient Prescriptions  Medication Sig Dispense Refill  . Cholecalciferol (VITAMIN D3) 2000 units TABS Take 1 capsule by mouth daily.    . clonazePAM (KLONOPIN) 1 MG tablet Take 1 mg by mouth at bedtime.     . divalproex (DEPAKOTE ER) 250 MG 24 hr tablet Take 1,250 mg by mouth every evening.    . eszopiclone (LUNESTA) 2 MG  TABS Take 2 mg by mouth at bedtime as needed for sleep. Take immediately before bedtime    . levothyroxine (SYNTHROID, LEVOTHROID) 112 MCG tablet TAKE 1 TABLET BY MOUTH DAILY 90 tablet 1  . Multiple Vitamins-Minerals (PRESERVISION AREDS 2) CAPS Take 1 capsule by mouth 2 (two) times daily.    . Vitamin D, Ergocalciferol, (DRISDOL) 50000 units CAPS capsule Take 1 capsule (50,000 Units total) by mouth every 7 (seven) days. 12 capsule 0   No current facility-administered medications for this visit.     Recently increased dose of synthroid  ___________________________________________________________________ Objective   Exam:  BP (!) 146/82 (BP Location: Right Arm, Patient Position: Sitting, Cuff Size: Normal)   Pulse (!) 113   Ht 5' 1.5" (1.562 m)   Wt 201 lb (91.2 kg)   BMI 37.36 kg/m    General: this is a(n) Pleasant and well-appearing elderly woman, mild intermittent tremor of the head   Eyes: sclera anicteric, no redness  ENT: oral mucosa moist without lesions, no cervical or supraclavicular lymphadenopathy, good dentition  CV: RRR without murmur, S1/S2, no JVD, no peripheral edema  Resp: clear to auscultation bilaterally, normal RR and effort noted  GI: soft, no tenderness, with active bowel sounds. No guarding or palpable organomegaly noted.  Skin; warm and dry, no rash or jaundice noted  Neuro: awake, alert and oriented x 3. Normal gross motor function and fluent speech Antalgic gait Labs:  CBC Latest Ref Rng & Units 12/19/2016 07/31/2016 02/14/2016  WBC 4.0 - 10.5 K/uL 8.1 6.7 7.9  Hemoglobin 12.0 - 15.0 g/dL 12.6 12.4 12.3  Hematocrit 36.0 - 46.0 % 36.9 36.6 36.2  Platelets 150.0 - 400.0 K/uL 169.0 179.0 200.0   Colonoscopy 12/13 - diverticulosis and very redundant SSP and TA polyps  Assessment: Encounter Diagnoses  Name Primary?  . Chronic diarrhea Yes  . Incontinence of feces with fecal urgency     This sounds most consistent with some dietary  triggers/maldigestion, possibly mild IBS. The symptoms can occur both at home or when she is out. There is been no nocturnal diarrhea, and again most of the time she feels quite well with normal bowel movements. Thus, I do not think it is likely to be microscopic colitis. There were no new medicines at the outset of this that would've caused the side effect.  Plan:  I gave her some written dietary advice She'll continue a high-fiber diet As needed Imodium, particularly when she is planning to leave the house, as it sounds like she may be starting to get some anticipatory diarrhea. I will be glad to revisit this as the need arises, particularly if the  diarrhea is worsening.  Thank you for the courtesy of this consult.  Please call me with any questions or concerns.  Nelida Meuse III  CC: Marletta Lor, MD

## 2017-03-09 ENCOUNTER — Other Ambulatory Visit: Payer: Self-pay | Admitting: Internal Medicine

## 2017-03-09 ENCOUNTER — Other Ambulatory Visit: Payer: Self-pay | Admitting: Family Medicine

## 2017-03-12 NOTE — Telephone Encounter (Signed)
Refill done.  

## 2017-04-18 ENCOUNTER — Other Ambulatory Visit: Payer: Self-pay | Admitting: Family Medicine

## 2017-04-18 NOTE — Telephone Encounter (Signed)
Refill done.  

## 2017-05-07 DIAGNOSIS — H25813 Combined forms of age-related cataract, bilateral: Secondary | ICD-10-CM | POA: Diagnosis not present

## 2017-05-07 DIAGNOSIS — H353131 Nonexudative age-related macular degeneration, bilateral, early dry stage: Secondary | ICD-10-CM | POA: Diagnosis not present

## 2017-05-07 DIAGNOSIS — H04123 Dry eye syndrome of bilateral lacrimal glands: Secondary | ICD-10-CM | POA: Diagnosis not present

## 2017-05-10 ENCOUNTER — Other Ambulatory Visit: Payer: Self-pay | Admitting: Family Medicine

## 2017-05-11 ENCOUNTER — Other Ambulatory Visit: Payer: Self-pay | Admitting: Family Medicine

## 2017-05-13 NOTE — Telephone Encounter (Signed)
Refill denied. Pt has completed 3 month course of Vitamin D & should now be taking OTC vitamin D 2000IU

## 2017-05-13 NOTE — Telephone Encounter (Signed)
Refill done.  

## 2017-06-03 ENCOUNTER — Ambulatory Visit (INDEPENDENT_AMBULATORY_CARE_PROVIDER_SITE_OTHER): Payer: Medicare Other | Admitting: Adult Health

## 2017-06-03 ENCOUNTER — Encounter: Payer: Self-pay | Admitting: Adult Health

## 2017-06-03 VITALS — BP 134/72 | HR 98 | Ht 61.0 in | Wt 203.8 lb

## 2017-06-03 DIAGNOSIS — Z9989 Dependence on other enabling machines and devices: Secondary | ICD-10-CM | POA: Diagnosis not present

## 2017-06-03 DIAGNOSIS — G4733 Obstructive sleep apnea (adult) (pediatric): Secondary | ICD-10-CM | POA: Diagnosis not present

## 2017-06-03 NOTE — Assessment & Plan Note (Signed)
Well controlled on CPAP At bedtime   Needs new nasal pillows   Plan  Patient Instructions  Continue on C Pap at bedtime Keep up the good work. Order for nasal pillows sent. Work on healthy weight Do not drive if sleepy Follow-up with Dr. Elsworth Soho in 1 year and as needed

## 2017-06-03 NOTE — Progress Notes (Signed)
@Patient  ID: Emily Castaneda, female    DOB: 1935-01-14, 82 y.o.   MRN: [id number]  Chief Complaint  Patient presents with  . Follow-up    OSA     Referring provider: Marletta Lor, MD  HPI: 82  yo female  With severe OSA  Bipolar dz  tests/ events  PSG 03/2004 showed severe OSA with RDI 75/h corrected by CPAP 17 cm, she used other interfaces before settling on nasal pillows   06/03/2017 Follow up : OSA  Patient presents for a follow-up for sleep apnea. Patient says she is doing well on C Pap. She wears it every single night. She feels rested with no significant get daytime sleepiness. She is having trouble with her mask leaking. Her mask is very old and is leaking. Download shows excellent compliance with average usage at 5.5 hours. Patient is on C Pap 16 cm H2O. AHI 0.3. Positive leaks.   No Known Allergies  Immunization History  Administered Date(s) Administered  . Influenza Split 09/20/2013  . Influenza, High Dose Seasonal PF 08/07/2016  . Pneumococcal Conjugate-13 09/11/2013  . Pneumococcal Polysaccharide-23 02/25/2009  . Td 02/25/2009  . Zoster 12/10/2012    Past Medical History:  Diagnosis Date  . Depression    Dr. Abner Greenspan  . Diverticulosis of colon   . Endometriosis   . Hx of colonic polyps   . Hyperlipidemia   . Hypothyroidism   . Manic, depressive ([place])   . Menopausal syndrome   . OSA (obstructive sleep apnea)    cpap; 17 cm (clint young)    Tobacco History: History  Smoking Status  . Former Smoker  . Packs/day: 0.10  . Years: 10.00  . Types: Cigarettes  . Quit date: 08/21/1983  Smokeless Tobacco  . Never Used    Comment: only smoked socially   Counseling given: Not Answered   Outpatient Encounter Prescriptions as of 06/03/2017  Medication Sig  . atorvastatin (LIPITOR) 10 MG tablet TAKE 1 TABLET(10 MG) BY MOUTH DAILY  . Cholecalciferol (VITAMIN D3) 2000 units TABS Take 1 capsule by mouth daily.  . clonazePAM (KLONOPIN) 1 MG  tablet Take 1 mg by mouth at bedtime.   . divalproex (DEPAKOTE ER) 250 MG 24 hr tablet Take 1,250 mg by mouth every evening.  . eszopiclone (LUNESTA) 2 MG TABS Take 2 mg by mouth at bedtime as needed for sleep. Take immediately before bedtime  . levothyroxine (SYNTHROID, LEVOTHROID) 112 MCG tablet TAKE 1 TABLET BY MOUTH DAILY  . Multiple Vitamins-Minerals (PRESERVISION AREDS 2) CAPS Take 1 capsule by mouth 2 (two) times daily.  . Vitamin D, Ergocalciferol, (DRISDOL) 50000 units CAPS capsule TAKE 1 CAPSULE BY MOUTH EVERY 7 DAYS (Patient not taking: Reported on 06/03/2017)   No facility-administered encounter medications on file as of 06/03/2017.      Review of Systems  Constitutional:   No  weight loss, night sweats,  Fevers, chills, fatigue, or  lassitude.  HEENT:   No headaches,  Difficulty swallowing,  Tooth/dental problems, or  Sore throat,                No sneezing, itching, ear ache, nasal congestion, post nasal drip,   CV:  No chest pain,  Orthopnea, PND, swelling in lower extremities, anasarca, dizziness, palpitations, syncope.   GI  No heartburn, indigestion, abdominal pain, nausea, vomiting, diarrhea, change in bowel habits, loss of appetite, bloody stools.   Resp: No shortness of breath with exertion or at rest.  No excess mucus,  no productive cough,  No non-productive cough,  No coughing up of blood.  No change in color of mucus.  No wheezing.  No chest wall deformity  Skin: no rash or lesions.  GU: no dysuria, change in color of urine, no urgency or frequency.  No flank pain, no hematuria   MS:  No joint pain or swelling.  No decreased range of motion.  No back pain.    Physical Exam  BP 134/72 (BP Location: Left Arm, Cuff Size: Normal)   Pulse 98   Ht 5\' 1"  (1.549 m)   Wt 203 lb 12.8 oz (92.4 kg)   SpO2 97%   BMI 38.51 kg/m   GEN: A/Ox3; pleasant , NAD, obese    HEENT:  [place]/AT,  EACs-clear, TMs-wnl, NOSE-clear, THROAT-clear, no lesions, no postnasal drip or  exudate noted. Class 2-3 MP airway   NECK:  Supple w/ fair ROM; no JVD; normal carotid impulses w/o bruits; no thyromegaly or nodules palpated; no lymphadenopathy.    RESP  Clear  P & A; w/o, wheezes/ rales/ or rhonchi. no accessory muscle use, no dullness to percussion  CARD:  RRR, no m/r/g, no peripheral edema, pulses intact, no cyanosis or clubbing.  GI:   Soft & nt; nml bowel sounds; no organomegaly or masses detected.   Musco: Warm bil, no deformities or joint swelling noted.   Neuro: alert, no focal deficits noted.    Skin: Warm, no lesions or rashes    Lab Results:   BMET  BNP No results found for: BNP  ProBNP No results found for: PROBNP  Imaging: No results found.   Assessment & Plan:   OSA on CPAP Well controlled on CPAP At bedtime   Needs new nasal pillows   Plan  Patient Instructions  Continue on C Pap at bedtime Keep up the good work. Order for nasal pillows sent. Work on healthy weight Do not drive if sleepy Follow-up with Dr. Elsworth Soho in 1 year and as needed     Morbid obesity ([place]) Wt loss      Rexene Edison, NP 06/03/2017

## 2017-06-03 NOTE — Patient Instructions (Signed)
Continue on C Pap at bedtime Keep up the good work. Order for nasal pillows sent. Work on healthy weight Do not drive if sleepy Follow-up with Dr. Elsworth Soho in 1 year and as needed

## 2017-06-03 NOTE — Assessment & Plan Note (Signed)
Wt loss  

## 2017-06-03 NOTE — Addendum Note (Signed)
Addended by: Parke Poisson E on: 06/03/2017 05:15 PM   Modules accepted: Orders

## 2017-06-04 ENCOUNTER — Other Ambulatory Visit: Payer: Self-pay | Admitting: Internal Medicine

## 2017-06-04 ENCOUNTER — Other Ambulatory Visit: Payer: Self-pay | Admitting: Family Medicine

## 2017-06-24 ENCOUNTER — Other Ambulatory Visit: Payer: Self-pay | Admitting: Family Medicine

## 2017-06-24 DIAGNOSIS — G4733 Obstructive sleep apnea (adult) (pediatric): Secondary | ICD-10-CM | POA: Diagnosis not present

## 2017-06-24 DIAGNOSIS — G4730 Sleep apnea, unspecified: Secondary | ICD-10-CM | POA: Diagnosis not present

## 2017-06-27 DIAGNOSIS — Z1231 Encounter for screening mammogram for malignant neoplasm of breast: Secondary | ICD-10-CM | POA: Diagnosis not present

## 2017-06-27 DIAGNOSIS — Z803 Family history of malignant neoplasm of breast: Secondary | ICD-10-CM | POA: Diagnosis not present

## 2017-06-27 LAB — HM MAMMOGRAPHY

## 2017-08-02 ENCOUNTER — Other Ambulatory Visit: Payer: Self-pay | Admitting: Family Medicine

## 2017-08-02 NOTE — Telephone Encounter (Signed)
Refill done.  

## 2017-08-08 ENCOUNTER — Encounter: Payer: Medicare Other | Admitting: Internal Medicine

## 2017-08-09 ENCOUNTER — Encounter: Payer: Medicare Other | Admitting: Internal Medicine

## 2017-08-09 ENCOUNTER — Encounter: Payer: Self-pay | Admitting: Internal Medicine

## 2017-09-03 ENCOUNTER — Encounter: Payer: Medicare Other | Admitting: Internal Medicine

## 2017-09-18 ENCOUNTER — Encounter: Payer: Self-pay | Admitting: Family Medicine

## 2017-09-18 ENCOUNTER — Ambulatory Visit (INDEPENDENT_AMBULATORY_CARE_PROVIDER_SITE_OTHER): Payer: Medicare Other | Admitting: Family Medicine

## 2017-09-18 VITALS — BP 124/68 | HR 67 | Temp 97.6°F | Wt 205.0 lb

## 2017-09-18 DIAGNOSIS — Z23 Encounter for immunization: Secondary | ICD-10-CM | POA: Diagnosis not present

## 2017-09-18 DIAGNOSIS — K580 Irritable bowel syndrome with diarrhea: Secondary | ICD-10-CM

## 2017-09-18 DIAGNOSIS — R600 Localized edema: Secondary | ICD-10-CM | POA: Diagnosis not present

## 2017-09-18 MED ORDER — DIPHENOXYLATE-ATROPINE 2.5-0.025 MG PO TABS: 2.0000 | ORAL_TABLET | Freq: Four times a day (QID) | ORAL | 0 refills | Status: DC | PRN

## 2017-09-18 MED ORDER — FUROSEMIDE 20 MG PO TABS: 20.0000 mg | ORAL_TABLET | Freq: Every day | ORAL | 3 refills | Status: DC

## 2017-09-18 NOTE — Progress Notes (Signed)
   Subjective:    Patient ID: Emily Castaneda, female    DOB: 1935/07/24, 83 y.o.   MRN: [id number]  HPI Here for mild pains and skin discolorations in both lower legs. This started about 2 months ago. Her legs began swelling around the same time. No chest pain or SOB. Also she asks about frequent diarrhea and urgent BMs that can cause accidents. This has been an issue for a year or more. She saw Dr. Loletha Carrow in GI last summer and he felt this was functional, possibly a form of IBS. He recommended she take Imodium as needed and gave her some dietary advice. The problem has continued since then. No fevers or blood in the stools. Her colonoscopy in 2013 was unremarkable except for a few polyps.    Review of Systems  Constitutional: Negative.   Respiratory: Negative.   Cardiovascular: Positive for leg swelling. Negative for chest pain.  Gastrointestinal: Positive for diarrhea. Negative for abdominal distention, abdominal pain, anal bleeding, blood in stool, constipation, nausea, rectal pain and vomiting.       Objective:   Physical Exam  Constitutional: She appears well-developed and well-nourished.  Neck: No thyromegaly present.  Cardiovascular: Normal rate, regular rhythm, normal heart sounds and intact distal pulses.  Pulmonary/Chest: Effort normal and breath sounds normal. No respiratory distress. She has no wheezes. She has no rales.  Abdominal: Soft. Bowel sounds are normal. She exhibits no distension and no mass. There is no tenderness. There is no rebound and no guarding.  Musculoskeletal:  3+ edema in both lower legs. No tenderness or warmth or erythema. There are some areas of darker macular skin   Lymphadenopathy:    She has no cervical adenopathy.          Assessment & Plan:  She has LE edema and the discoloration is a part of this process. No evidence of infection. I do not think this is cardiac in nature. Get a TSH and BMET today to check renal function, etc. Try Lasix 20 mg  each morning. She seems to have IBS. She will try Lomotil as needed, especially before church, going out to restaurants, etc.

## 2017-09-19 ENCOUNTER — Other Ambulatory Visit: Payer: Self-pay | Admitting: Family Medicine

## 2017-09-19 LAB — BASIC METABOLIC PANEL
BUN: 21 mg/dL (ref 6–23)
CALCIUM: 9.4 mg/dL (ref 8.4–10.5)
CHLORIDE: 103 meq/L (ref 96–112)
CO2: 29 mEq/L (ref 19–32)
CREATININE: 0.74 mg/dL (ref 0.40–1.20)
GFR: 80.12 mL/min (ref >60.00)
Glucose, Bld: 114 mg/dL — ABNORMAL HIGH (ref 70–99)
Potassium: 4.2 mEq/L (ref 3.5–5.1)
Sodium: 141 mEq/L (ref 135–145)

## 2017-09-19 LAB — TSH: TSH: 4.19 u[IU]/mL (ref 0.35–4.50)

## 2017-09-28 ENCOUNTER — Other Ambulatory Visit: Payer: Self-pay | Admitting: Family Medicine

## 2017-10-06 NOTE — Progress Notes (Signed)
[facility] [address], [place] [address] Phone: [phone] Subjective:    I'm seeing this patient by the request  of:    CC: Right knee pain  XNA:[id number]  Emily Castaneda is a 83 y.o. female coming in with complaint of degenerative knee arthritis.  Patient states been worsening recently.  Increasing instability.  Has had degenerative left knee previously.  Responded well to the injections of this.  Still having no pain on the left side.  Right-sided knee pain seems to be radiating up towards her hip.  Denies any groin pain.  Has some mild back pain     Past Medical History:  Diagnosis Date  . Depression    Dr. Abner Greenspan  . Diverticulosis of colon   . Endometriosis   . Hx of colonic polyps   . Hyperlipidemia   . Hypothyroidism   . Manic, depressive ([place])   . Menopausal syndrome   . OSA (obstructive sleep apnea)    cpap; 17 cm (clint young)   Past Surgical History:  Procedure Laterality Date  . APPENDECTOMY    . COLONOSCOPY    . POLYPECTOMY     Dr. Ubaldo Glassing  . TONSILLECTOMY AND ADENOIDECTOMY     Social History   Socioeconomic History  . Marital status: Married    Spouse name: None  . Number of children: None  . Years of education: None  . Highest education level: None  Social Needs  . Financial resource strain: None  . Food insecurity - worry: None  . Food insecurity - inability: None  . Transportation needs - medical: None  . Transportation needs - non-medical: None  Occupational History  . Occupation: retired  Tobacco Use  . Smoking status: Former Smoker    Packs/day: 0.10    Years: 10.00    Pack years: 1.00    Types: Cigarettes    Last attempt to quit: 08/21/1983    Years since quitting: 34.1  . Smokeless tobacco: Never Used  . Tobacco comment: only smoked socially  Substance and Sexual Activity  . Alcohol use: Yes    Alcohol/week: 0.6 - 1.2 oz    Types: 1 - 2 Standard drinks or equivalent per week    Comment:  occ glass of wine   . Drug use: No  . Sexual activity: None  Other Topics Concern  . None  Social History Narrative  . None   No Known Allergies Family History  Problem Relation Age of Onset  . Diabetes Mother   . Heart disease Father        complications of coronary heart disease  . Diabetes Brother   . Heart disease Other   . Cancer Other        BREAST AND LUNG   . Breast cancer Maternal Aunt   . Cancer Maternal Grandfather        LUNG  . Breast cancer Daughter   . Colon cancer Neg Hx   . Rectal cancer Neg Hx   . Stomach cancer Neg Hx      Past medical history, social, surgical and family history all reviewed in electronic medical record.  No pertanent information unless stated regarding to the chief complaint.   Review of Systems:Review of systems updated and as accurate as of 10/07/17  No headache, visual changes, nausea, vomiting, diarrhea, constipation, dizziness, abdominal pain, skin rash, fevers, chills, night sweats, weight loss, swollen lymph nodes, body aches, joint swelling,chest pain, shortness of breath,  mood changes.  Positive muscle aches  Objective  Blood pressure 132/72, pulse (!) 104, height 5\' 1"  (1.549 m), weight 201 lb (91.2 kg), SpO2 98 %. Systems examined below as of 10/07/17   General: No apparent distress alert and oriented x3 mood and affect normal, dressed appropriately.  HEENT: Pupils equal, extraocular movements intact  Respiratory: Patient's speak in full sentences and does not appear short of breath  Cardiovascular: No lower extremity edema, non tender, no erythema  Skin: Warm dry intact with no signs of infection or rash on extremities or on axial skeleton.  Abdomen: Soft nontender  Neuro: Cranial nerves II through XII are intact, neurovascularly intact in all extremities with 2+ DTRs and 2+ pulses.  Lymph: No lymphadenopathy of posterior or anterior cervical chain or axillae bilaterally.  Gait antalgic gait MSK:  Non tender with full  range of motion and good stability and symmetric strength and tone of shoulders, elbows, wrist, hip and ankles bilaterally.  Arthritic changes of multiple joints Knee: Right valgus deformity noted.  Abnormal thigh to calf ratio.  Tender to palpation over medial and PF joint line.  Mild limitation in all planes. instability with valgus force.  painful patellar compression. Patellar glide with moderate crepitus. Patellar and quadriceps tendons unremarkable. Hamstring and quadriceps strength is normal. Contralateral knee shows arthritic changes as well.  After informed written and verbal consent, patient was seated on exam table. Right knee was prepped with alcohol swab and utilizing anterolateral approach, patient's right knee space was injected with 4:1  marcaine 0.5%: Kenalog 40mg /dL. Patient tolerated the procedure well without immediate complications.   Impression and Recommendations:     This case required medical decision making of moderate complexity.      Note: This dictation was prepared with Dragon dictation along with smaller phrase technology. Any transcriptional errors that result from this process are unintentional.

## 2017-10-07 ENCOUNTER — Encounter: Payer: Self-pay | Admitting: Family Medicine

## 2017-10-07 ENCOUNTER — Ambulatory Visit: Payer: Medicare Other | Admitting: Family Medicine

## 2017-10-07 DIAGNOSIS — M1711 Unilateral primary osteoarthritis, right knee: Secondary | ICD-10-CM

## 2017-10-07 NOTE — Patient Instructions (Addendum)
Good to see you  I am sorry you are hurting.  The thyroid was actually checked and looked ok  Ice 20 minutes 2 times daily. Usually after activity and before bed. We will consider PT in the future If not all the way better we will try injection on the lateral side of the hip  Have other injections for the knee as well  See me again in 3-4 weeks

## 2017-10-07 NOTE — Assessment & Plan Note (Signed)
Patient given injection and tolerated the procedure well.  We discussed icing regimen and home exercises.  Discussed with patient could be a candidate for Visco supplementation if needed.  Patient will be fitted for a custom brace.  This is secondary to patient's abnormal thigh to calf ratio.  Follow-up again 4 weeks

## 2017-10-09 ENCOUNTER — Encounter: Payer: Self-pay | Admitting: Internal Medicine

## 2017-10-09 ENCOUNTER — Ambulatory Visit (INDEPENDENT_AMBULATORY_CARE_PROVIDER_SITE_OTHER): Payer: Medicare Other | Admitting: Internal Medicine

## 2017-10-09 VITALS — BP 122/60 | HR 80 | Temp 98.0°F | Ht 62.25 in | Wt 201.8 lb

## 2017-10-09 DIAGNOSIS — G4733 Obstructive sleep apnea (adult) (pediatric): Secondary | ICD-10-CM | POA: Diagnosis not present

## 2017-10-09 DIAGNOSIS — M353 Polymyalgia rheumatica: Secondary | ICD-10-CM

## 2017-10-09 DIAGNOSIS — Z9989 Dependence on other enabling machines and devices: Secondary | ICD-10-CM

## 2017-10-09 DIAGNOSIS — M1711 Unilateral primary osteoarthritis, right knee: Secondary | ICD-10-CM | POA: Diagnosis not present

## 2017-10-09 DIAGNOSIS — E039 Hypothyroidism, unspecified: Secondary | ICD-10-CM | POA: Diagnosis not present

## 2017-10-09 DIAGNOSIS — Z0000 Encounter for general adult medical examination without abnormal findings: Secondary | ICD-10-CM | POA: Diagnosis not present

## 2017-10-09 DIAGNOSIS — E785 Hyperlipidemia, unspecified: Secondary | ICD-10-CM | POA: Diagnosis not present

## 2017-10-09 LAB — CBC WITH DIFFERENTIAL/PLATELET
BASOS ABS: 0 10*3/uL (ref 0.0–0.1)
Basophils Relative: 0.4 % (ref 0.0–3.0)
Eosinophils Absolute: 0 10*3/uL (ref 0.0–0.7)
Eosinophils Relative: 0.4 % (ref 0.0–5.0)
HCT: 38.7 % (ref 36.0–46.0)
Hemoglobin: 13.1 g/dL (ref 12.0–15.0)
LYMPHS ABS: 3.7 10*3/uL (ref 0.7–4.0)
LYMPHS PCT: 34.9 % (ref 12.0–46.0)
MCHC: 33.9 g/dL (ref 30.0–36.0)
MCV: 96.8 fl (ref 78.0–100.0)
MONOS PCT: 6.4 % (ref 3.0–12.0)
Monocytes Absolute: 0.7 10*3/uL (ref 0.1–1.0)
NEUTROS PCT: 57.9 % (ref 43.0–77.0)
Neutro Abs: 6.1 10*3/uL (ref 1.4–7.7)
Platelets: 182 10*3/uL (ref 150.0–400.0)
RBC: 3.99 Mil/uL (ref 3.87–5.11)
RDW: 14.6 % (ref 11.5–15.5)
WBC: 10.6 10*3/uL — ABNORMAL HIGH (ref 4.0–10.5)

## 2017-10-09 LAB — COMPREHENSIVE METABOLIC PANEL
ALBUMIN: 3.9 g/dL (ref 3.5–5.2)
ALK PHOS: 40 U/L (ref 39–117)
ALT: 19 U/L (ref 0–35)
AST: 19 U/L (ref 0–37)
BILIRUBIN TOTAL: 0.4 mg/dL (ref 0.2–1.2)
BUN: 19 mg/dL (ref 6–23)
CALCIUM: 9.3 mg/dL (ref 8.4–10.5)
CO2: 32 mEq/L (ref 19–32)
Chloride: 102 mEq/L (ref 96–112)
Creatinine, Ser: 0.72 mg/dL (ref 0.40–1.20)
GFR: 82.68 mL/min (ref >60.00)
GLUCOSE: 117 mg/dL — AB (ref 70–99)
Potassium: 4 mEq/L (ref 3.5–5.1)
Sodium: 142 mEq/L (ref 135–145)
TOTAL PROTEIN: 6.6 g/dL (ref 6.0–8.3)

## 2017-10-09 LAB — LIPID PANEL
Cholesterol: 203 mg/dL — ABNORMAL HIGH (ref 0–200)
HDL: 46.7 mg/dL (ref >39.00)
NonHDL: 156.07
Total CHOL/HDL Ratio: 4
Triglycerides: 230 mg/dL — ABNORMAL HIGH (ref 0.0–149.0)
VLDL: 46 mg/dL — AB (ref 0.0–40.0)

## 2017-10-09 LAB — LDL CHOLESTEROL, DIRECT: LDL DIRECT: 127 mg/dL

## 2017-10-09 NOTE — Progress Notes (Signed)
Subjective:    Patient ID: Emily Castaneda, female    DOB: 09-30-1934, 83 y.o.   MRN: [id number]  HPI  83 year old patient who was scheduled today for a physical.  However she declined to disrobe for a clinical exam. Complaints today include primarily right knee pain.  She has been seen recently by sports medicine and received a intra-articular injection 2 days ago. She is followed by psychiatry. She remains on statin therapy and supplemental levothyroxine.  Past Medical History:  Diagnosis Date  . Depression    Dr. Abner Greenspan  . Diverticulosis of colon   . Endometriosis   . Hx of colonic polyps   . Hyperlipidemia   . Hypothyroidism   . Manic, depressive ([place])   . Menopausal syndrome   . OSA (obstructive sleep apnea)    cpap; 17 cm (clint young)     Social History   Socioeconomic History  . Marital status: Married    Spouse name: Not on file  . Number of children: Not on file  . Years of education: Not on file  . Highest education level: Not on file  Social Needs  . Financial resource strain: Not on file  . Food insecurity - worry: Not on file  . Food insecurity - inability: Not on file  . Transportation needs - medical: Not on file  . Transportation needs - non-medical: Not on file  Occupational History  . Occupation: retired  Tobacco Use  . Smoking status: Former Smoker    Packs/day: 0.10    Years: 10.00    Pack years: 1.00    Types: Cigarettes    Last attempt to quit: 08/21/1983    Years since quitting: 34.1  . Smokeless tobacco: Never Used  . Tobacco comment: only smoked socially  Substance and Sexual Activity  . Alcohol use: Yes    Alcohol/week: 0.6 - 1.2 oz    Types: 1 - 2 Standard drinks or equivalent per week    Comment: occ glass of wine   . Drug use: No  . Sexual activity: Not on file  Other Topics Concern  . Not on file  Social History Narrative  . Not on file    Past Surgical History:  Procedure Laterality Date  . APPENDECTOMY    .  COLONOSCOPY    . POLYPECTOMY     Dr. Ubaldo Glassing  . TONSILLECTOMY AND ADENOIDECTOMY      Family History  Problem Relation Age of Onset  . Diabetes Mother   . Heart disease Father        complications of coronary heart disease  . Diabetes Brother   . Heart disease Other   . Cancer Other        BREAST AND LUNG   . Breast cancer Maternal Aunt   . Cancer Maternal Grandfather        LUNG  . Breast cancer Daughter   . Colon cancer Neg Hx   . Rectal cancer Neg Hx   . Stomach cancer Neg Hx     No Known Allergies  Current Outpatient Medications on File Prior to Visit  Medication Sig Dispense Refill  . atorvastatin (LIPITOR) 10 MG tablet TAKE 1 TABLET(10 MG) BY MOUTH DAILY 90 tablet 1  . Cholecalciferol (VITAMIN D3) 2000 units TABS Take 1 capsule by mouth daily.    . clonazePAM (KLONOPIN) 1 MG tablet Take 1 mg by mouth at bedtime.     . diphenoxylate-atropine (LOMOTIL) 2.5-0.025 MG tablet Take 2 tablets by mouth  4 (four) times daily as needed for diarrhea or loose stools. 120 tablet 0  . divalproex (DEPAKOTE ER) 250 MG 24 hr tablet Take 1,250 mg by mouth every evening.    . eszopiclone (LUNESTA) 2 MG TABS Take 2 mg by mouth at bedtime as needed for sleep. Take immediately before bedtime    . furosemide (LASIX) 20 MG tablet Take 1 tablet (20 mg total) by mouth daily. 30 tablet 3  . levothyroxine (SYNTHROID, LEVOTHROID) 112 MCG tablet TAKE 1 TABLET BY MOUTH DAILY 90 tablet 0  . Multiple Vitamins-Minerals (PRESERVISION AREDS 2) CAPS Take 1 capsule by mouth 2 (two) times daily.    . TURMERIC CURCUMIN PO Take by mouth.    . Vitamin D, Ergocalciferol, (DRISDOL) 50000 units CAPS capsule TAKE 1 CAPSULE BY MOUTH EVERY 7 DAYS 12 capsule 0   No current facility-administered medications on file prior to visit.     BP 122/60 (BP Location: Left Arm, Patient Position: Sitting, Cuff Size: Large)   Pulse 80   Temp 98 F (36.7 C) (Oral)   Ht 5' 2.25" (1.581 m)   Wt 201 lb 12.8 oz (91.5 kg)   SpO2 98%    BMI 36.61 kg/m   Subsequent Medicare wellness visit  1. Risk factors, based on past  M,S,F history.  Cardiovascular risk factors include a history of hypertension and dyslipidemia  2.  Physical activities: Limited due to obesity and arthritis involving the knees.  Does participate still in water aerobics  3.  Depression/mood: History of major depression.  Followed closely by psychiatry  4.  Hearing: Motor deficits only  5.  ADL's: Independent  6.  Fall risk: Mild to moderate due to arthritic knees and obesity as well as general deconditioning  7.  Home safety: No problems identified  8.  Height weight, and visual acuity; height and weight stable no change in visual acuity  9.  Counseling: Weight loss encouraged  10. Lab orders based on risk factors: Laboratory update will be reviewed  11. Referral : Follow-up sports medicine and psychiatry  12. Care plan: Continue efforts at weight loss and more rigorous activities  13. Cognitive assessment: Alert and appropriate normal affect.  No cognitive dysfunction  14. Screening: Patient provided with a written and personalized 5-10 year screening schedule in the AVS.    15. Provider List Update: Primary care psychiatry sports medicine as well as pulmonary medicine    Review of Systems  Constitutional: Negative.   HENT: Negative for congestion, dental problem, hearing loss, rhinorrhea, sinus pressure, sore throat and tinnitus.   Eyes: Negative for pain, discharge and visual disturbance.  Respiratory: Negative for cough and shortness of breath.   Cardiovascular: Negative for chest pain, palpitations and leg swelling.  Gastrointestinal: Negative for abdominal distention, abdominal pain, blood in stool, constipation, diarrhea, nausea and vomiting.  Genitourinary: Negative for difficulty urinating, dysuria, flank pain, frequency, hematuria, pelvic pain, urgency, vaginal bleeding, vaginal discharge and vaginal pain.  Musculoskeletal:  Positive for arthralgias, gait problem and joint swelling.  Skin: Negative for rash.  Neurological: Negative for dizziness, syncope, speech difficulty, weakness, numbness and headaches.  Hematological: Negative for adenopathy.  Psychiatric/Behavioral: Positive for decreased concentration and dysphoric mood. Negative for agitation and behavioral problems. The patient is not nervous/anxious.        Objective:   Physical Exam  Constitutional: She is oriented to person, place, and time. She appears well-developed and well-nourished.  Weight 202 Blood pressure low normal  HENT:  Head: Normocephalic.  Right Ear: External ear normal.  Left Ear: External ear normal.  Mouth/Throat: Oropharynx is clear and moist.  Eyes: Conjunctivae and EOM are normal. Pupils are equal, round, and reactive to light.  Neck: Normal range of motion. Neck supple. No thyromegaly present.  Cardiovascular: Normal rate, regular rhythm, normal heart sounds and intact distal pulses.  Pulmonary/Chest: Effort normal and breath sounds normal.  Abdominal: Soft. Bowel sounds are normal. She exhibits no mass. There is no tenderness.  Musculoskeletal: Normal range of motion.  Lymphadenopathy:    She has no cervical adenopathy.  Neurological: She is alert and oriented to person, place, and time.  Skin: Skin is warm and dry. No rash noted.  Psychiatric: She has a normal mood and affect. Her behavior is normal.          Assessment & Plan:   Subsequent Medicare wellness visit Dyslipidemia.  Will review a lipid profile hypothyroidism.  Will continue levothyroxine supplement.  Osteoarthritis.  Follow-up sports medicine  Return here in 6 months or as needed  Nyoka Cowden 83 year old patient who is seen today for follow-up with blood pressure concerns.

## 2017-10-09 NOTE — Patient Instructions (Signed)
Limit your sodium (Salt) intake    It is important that you exercise regularly, at least 20 minutes 3 to 4 times per week.  If you develop chest pain or shortness of breath seek  medical attention.  You need to lose weight.  Consider a lower calorie diet and regular exercise.  Return in 6 months for follow-up   

## 2017-10-15 ENCOUNTER — Other Ambulatory Visit: Payer: Self-pay | Admitting: Family Medicine

## 2017-11-05 ENCOUNTER — Encounter: Payer: Self-pay | Admitting: Internal Medicine

## 2017-11-05 DIAGNOSIS — H04123 Dry eye syndrome of bilateral lacrimal glands: Secondary | ICD-10-CM | POA: Diagnosis not present

## 2017-11-05 DIAGNOSIS — H353132 Nonexudative age-related macular degeneration, bilateral, intermediate dry stage: Secondary | ICD-10-CM | POA: Diagnosis not present

## 2017-11-05 DIAGNOSIS — H2513 Age-related nuclear cataract, bilateral: Secondary | ICD-10-CM | POA: Diagnosis not present

## 2017-11-05 NOTE — Progress Notes (Deleted)
[facility] [place], [place] [address] Phone: [phone] Subjective:    I'm seeing this patient by the request  of:    CC:   [id number]  Emily Castaneda is a 83 y.o. female coming in with complaint of ***  Onset-  Location Duration-  Character- Aggravating factors- Reliving factors-  Therapies tried-  Severity-     Past Medical History:  Diagnosis Date  . Depression    Dr. Abner Greenspan  . Diverticulosis of colon   . Endometriosis   . Hx of colonic polyps   . Hyperlipidemia   . Hypothyroidism   . Manic, depressive ([place])   . Menopausal syndrome   . OSA (obstructive sleep apnea)    cpap; 17 cm (clint young)   Past Surgical History:  Procedure Laterality Date  . APPENDECTOMY    . COLONOSCOPY    . POLYPECTOMY     Dr. Ubaldo Glassing  . TONSILLECTOMY AND ADENOIDECTOMY     Social History   Socioeconomic History  . Marital status: Married    Spouse name: Not on file  . Number of children: Not on file  . Years of education: Not on file  . Highest education level: Not on file  Social Needs  . Financial resource strain: Not on file  . Food insecurity - worry: Not on file  . Food insecurity - inability: Not on file  . Transportation needs - medical: Not on file  . Transportation needs - non-medical: Not on file  Occupational History  . Occupation: retired  Tobacco Use  . Smoking status: Former Smoker    Packs/day: 0.10    Years: 10.00    Pack years: 1.00    Types: Cigarettes    Last attempt to quit: 08/21/1983    Years since quitting: 34.2  . Smokeless tobacco: Never Used  . Tobacco comment: only smoked socially  Substance and Sexual Activity  . Alcohol use: Yes    Alcohol/week: 0.6 - 1.2 oz    Types: 1 - 2 Standard drinks or equivalent per week    Comment: occ glass of wine   . Drug use: No  . Sexual activity: Not on file  Other Topics Concern  . Not on file  Social History Narrative  . Not on file   No Known  Allergies Family History  Problem Relation Age of Onset  . Diabetes Mother   . Heart disease Father        complications of coronary heart disease  . Diabetes Brother   . Heart disease Other   . Cancer Other        BREAST AND LUNG   . Breast cancer Maternal Aunt   . Cancer Maternal Grandfather        LUNG  . Breast cancer Daughter   . Colon cancer Neg Hx   . Rectal cancer Neg Hx   . Stomach cancer Neg Hx      Past medical history, social, surgical and family history all reviewed in electronic medical record.  No pertanent information unless stated regarding to the chief complaint.   Review of Systems:Review of systems updated and as accurate as of 11/05/17  No headache, visual changes, nausea, vomiting, diarrhea, constipation, dizziness, abdominal pain, skin rash, fevers, chills, night sweats, weight loss, swollen lymph nodes, body aches, joint swelling, muscle aches, chest pain, shortness of breath, mood changes.   Objective  There were no vitals taken for this visit. Systems examined below  as of 11/05/17   General: No apparent distress alert and oriented x3 mood and affect normal, dressed appropriately.  HEENT: Pupils equal, extraocular movements intact  Respiratory: Patient's speak in full sentences and does not appear short of breath  Cardiovascular: No lower extremity edema, non tender, no erythema  Skin: Warm dry intact with no signs of infection or rash on extremities or on axial skeleton.  Abdomen: Soft nontender  Neuro: Cranial nerves II through XII are intact, neurovascularly intact in all extremities with 2+ DTRs and 2+ pulses.  Lymph: No lymphadenopathy of posterior or anterior cervical chain or axillae bilaterally.  Gait normal with good balance and coordination.  MSK:  Non tender with full range of motion and good stability and symmetric strength and tone of shoulders, elbows, wrist, hip, knee and ankles bilaterally.     Impression and Recommendations:      This case required medical decision making of moderate complexity.      Note: This dictation was prepared with Dragon dictation along with smaller phrase technology. Any transcriptional errors that result from this process are unintentional.

## 2017-11-06 ENCOUNTER — Ambulatory Visit: Payer: Medicare Other | Admitting: Family Medicine

## 2017-11-06 DIAGNOSIS — Z0289 Encounter for other administrative examinations: Secondary | ICD-10-CM

## 2017-11-21 DIAGNOSIS — Z79899 Other long term (current) drug therapy: Secondary | ICD-10-CM | POA: Diagnosis not present

## 2017-12-11 ENCOUNTER — Other Ambulatory Visit: Payer: Self-pay | Admitting: Internal Medicine

## 2017-12-26 DIAGNOSIS — G4733 Obstructive sleep apnea (adult) (pediatric): Secondary | ICD-10-CM | POA: Diagnosis not present

## 2017-12-26 DIAGNOSIS — G4730 Sleep apnea, unspecified: Secondary | ICD-10-CM | POA: Diagnosis not present

## 2017-12-28 ENCOUNTER — Other Ambulatory Visit: Payer: Self-pay | Admitting: Family Medicine

## 2018-01-09 ENCOUNTER — Other Ambulatory Visit: Payer: Self-pay | Admitting: Family Medicine

## 2018-01-16 ENCOUNTER — Other Ambulatory Visit (HOSPITAL_COMMUNITY): Payer: Self-pay | Admitting: Psychiatry

## 2018-01-16 DIAGNOSIS — F0390 Unspecified dementia without behavioral disturbance: Secondary | ICD-10-CM

## 2018-01-16 DIAGNOSIS — D499 Neoplasm of unspecified behavior of unspecified site: Secondary | ICD-10-CM

## 2018-01-16 DIAGNOSIS — R410 Disorientation, unspecified: Secondary | ICD-10-CM

## 2018-01-18 ENCOUNTER — Other Ambulatory Visit: Payer: Self-pay | Admitting: Family Medicine

## 2018-01-20 NOTE — Telephone Encounter (Signed)
Refill done.  

## 2018-01-24 ENCOUNTER — Ambulatory Visit (HOSPITAL_COMMUNITY)
Admission: RE | Admit: 2018-01-24 | Discharge: 2018-01-24 | Disposition: A | Discharge status: Home / Self Care | Payer: Medicare Other | Source: Ambulatory Visit | Attending: Psychiatry | Admitting: Psychiatry

## 2018-01-24 DIAGNOSIS — G319 Degenerative disease of nervous system, unspecified: Secondary | ICD-10-CM | POA: Insufficient documentation

## 2018-01-24 DIAGNOSIS — I739 Peripheral vascular disease, unspecified: Secondary | ICD-10-CM | POA: Diagnosis not present

## 2018-01-24 DIAGNOSIS — R410 Disorientation, unspecified: Secondary | ICD-10-CM | POA: Diagnosis not present

## 2018-02-05 ENCOUNTER — Other Ambulatory Visit: Payer: Self-pay

## 2018-02-05 ENCOUNTER — Other Ambulatory Visit: Payer: Self-pay | Admitting: Family Medicine

## 2018-02-05 MED ORDER — DIPHENOXYLATE-ATROPINE 2.5-0.025 MG PO TABS: ORAL_TABLET | ORAL | 0 refills | Status: DC

## 2018-02-05 NOTE — Telephone Encounter (Signed)
Last OV 10/09/2017   Last refilled 09/18/2017 disp 120 with no refills

## 2018-02-14 ENCOUNTER — Other Ambulatory Visit: Payer: Self-pay | Admitting: Family Medicine

## 2018-02-14 NOTE — Telephone Encounter (Signed)
I would rather Dr. Raliegh Ip handle this next week

## 2018-02-14 NOTE — Telephone Encounter (Signed)
Last OV 10/09/2017   Last refilled 02/05/2018 disp 120 with no refills   Sent to [place] for approval  Pt is a Dr. Raliegh Ip pt who is NOT in the office

## 2018-02-17 NOTE — Telephone Encounter (Signed)
Please advise Pt should still have enough for half of July.

## 2018-03-01 ENCOUNTER — Other Ambulatory Visit: Payer: Self-pay | Admitting: Internal Medicine

## 2018-03-01 ENCOUNTER — Other Ambulatory Visit: Payer: Self-pay | Admitting: Family Medicine

## 2018-04-06 ENCOUNTER — Other Ambulatory Visit: Payer: Self-pay | Admitting: Family Medicine

## 2018-05-14 ENCOUNTER — Ambulatory Visit: Payer: Self-pay | Admitting: Internal Medicine

## 2018-05-14 NOTE — Telephone Encounter (Signed)
I returned the call to pt's husband Cornelia Copa.   He took his wife's BP with his wrist monitor and got 98/60.   She is not being treated for  BP issues or taking BP medications.    Per husband,  "I just wanted to check it".    I asked if she was vomiting or having diarrhea.   Husband said she is having some diarrhea but has started taking some medication Dr. Sarajane Jews had given her for diarrhea.    I encouraged him to have her drink plenty of fluids because with the diarrhea she is losing fluid.   He verbalized understanding.    I instructed him to call us if she becomes dizzy, weak, or the diarrhea does not resolve with the medication.  If BP <90 on top number to please call us back she may be getting dehydrated.  He verbalized understanding and was agreeable to this plan.   Reason for Disposition . Reason: professional judgment or information in Reference  Answer Assessment - Initial Assessment Questions 1. BLOOD PRESSURE: "What is the blood pressure?" "Did you take at least two measurements 5 minutes apart?"  Husband, Cornelia Copa was called.   2 mornings in a row she has slid out of bed.   I had to get my neighbor to come get her up.   She is up now and eating breakfast.   I asked him to recheck the BP - It's 101/54. 2. ONSET: "When did you take your blood pressure?"     It was 98/60 this morning.    Denies wife having dizziness. 3. HOW: "How did you obtain the blood pressure?" (e.g., visiting nurse, automatic home BP monitor)     I have a wrist BP monitor.   It is close to the one in the office. 4. HISTORY: "Do you have a history of low blood pressure?" "What is your blood pressure normally?"     Husband doesn't know.   5. MEDICATIONS: "Are you taking any medications for blood pressure?" If yes: "Have they been changed recently?"     Not on any medications. 6. PULSE RATE: "Do you know what your pulse rate is?"      *No Answer* 7. OTHER SYMPTOMS: "Have you been sick recently?" "Have you had a recent  injury?"     Been having diarrhea.   Dr. Sarajane Jews gave her some pills which she is taking. 8. PREGNANCY: "Is there any chance you are pregnant?" "When was your last menstrual period?"     Not asked due to age  Protocols used: NO GUIDELINE AVAILABLE-P-AH, LOW BLOOD PRESSURE-A-AH

## 2018-05-19 ENCOUNTER — Encounter: Payer: Self-pay | Admitting: Family Medicine

## 2018-05-19 ENCOUNTER — Ambulatory Visit (INDEPENDENT_AMBULATORY_CARE_PROVIDER_SITE_OTHER): Payer: Medicare Other | Admitting: Family Medicine

## 2018-05-19 VITALS — BP 104/70 | HR 95 | Temp 98.0°F | Resp 16 | Ht 62.25 in | Wt 193.1 lb

## 2018-05-19 DIAGNOSIS — E039 Hypothyroidism, unspecified: Secondary | ICD-10-CM | POA: Diagnosis not present

## 2018-05-19 DIAGNOSIS — R2681 Unsteadiness on feet: Secondary | ICD-10-CM

## 2018-05-19 DIAGNOSIS — E559 Vitamin D deficiency, unspecified: Secondary | ICD-10-CM

## 2018-05-19 DIAGNOSIS — R413 Other amnesia: Secondary | ICD-10-CM

## 2018-05-19 DIAGNOSIS — F341 Dysthymic disorder: Secondary | ICD-10-CM

## 2018-05-19 DIAGNOSIS — R296 Repeated falls: Secondary | ICD-10-CM

## 2018-05-19 LAB — VITAMIN D 25 HYDROXY (VIT D DEFICIENCY, FRACTURES): VITD: 46.77 ng/mL (ref 30.00–100.00)

## 2018-05-19 LAB — VITAMIN B12: VITAMIN B 12: 323 pg/mL (ref 211–911)

## 2018-05-19 NOTE — Assessment & Plan Note (Signed)
No changes in current management, will follow labs done today and will give further recommendations accordingly.  

## 2018-05-19 NOTE — Assessment & Plan Note (Signed)
No changes in vitamin D3 5000 units daily. Further recommendations will be given according to 25 OH vitamin D results.

## 2018-05-19 NOTE — Progress Notes (Signed)
HPI:   [name] is a 83 y.o. female, who is here today with her husband to establish care.  Former PCP: Dr. Burnice Logan Last preventive routine visit: 01/21/15 routine gyn.  Chronic medical problems: According to husband, recently diagnosed with Alzheimer's dementia. She also has history of depression, she follows with psychiatrist, Dr. Casimiro Needle. Chronic diarrhea, hypothyroidism, OSA on CPAP, and generalized OA.  Because history of dementia, her husband provides most of the history.    Concerns today: Low BP's readings. Attributed to Aricept. BP's 80-70's/50's  She was recently started on Aricept 5 mg and titrated to 10 mg.  According to her husband she has not been the same since medication was started. He contacted Dr. Casimiro Needle, who according to patient's husband recommended discontinuing Aricept, Last dose of Aricept taking about 3 days ago. Her husband also reports decreased appetite, which he also attributes to Aricept.  Her husband is also concerned about intermittent episodes of dizziness. She has had 3 falls in the past week. She has not been severely injured, so her husband has not seek medical attention. And is stable gait, she has a cane and a walker at home.  She is not using neither one today.  He has not noted MS changes.  She is drinking a few sips of coke,water,and coffee.  She is on furosemide 20 mg daily, started to treat "fluid retention."   Lab Results  Component Value Date   CREATININE 0.72 10/09/2017   BUN 19 10/09/2017   NA 142 10/09/2017   K 4.0 10/09/2017   CL 102 10/09/2017   CO2 32 10/09/2017    No ER visit, no seriously harmed.  She is currently on clonazepam, Lunesta, and Depakote ER. According to husband she has been on the same medications for years, so he does not think these medications are increasing the risk of falling.  Hypothyroidism: Currently she is on levothyroxine 112 mcg daily. Negative for call or heat  intolerance. Her husband has not noted abnormal weight loss. I noted mild hip and hand tremors, husband denies any prior history.  Lab Results  Component Value Date   TSH 4.19 09/18/2017   Vitamin D deficiency: She completed course of ergocalciferol 50,000 units weekly. Currently she is on vitamin D3 5000 units daily.   Review of Systems  Constitutional: Negative for activity change, appetite change, fatigue and fever.  HENT: Negative for mouth sores and nosebleeds.   Eyes: Negative for redness and visual disturbance.  Respiratory: Negative for cough, shortness of breath and wheezing.   Cardiovascular: Negative for chest pain, palpitations and leg swelling.  Gastrointestinal: Negative for abdominal pain, nausea and vomiting.       Negative for changes in bowel habits.  Endocrine: Negative for cold intolerance and heat intolerance.  Genitourinary: Negative for decreased urine volume, dysuria and hematuria.  Musculoskeletal: Positive for arthralgias and gait problem.  Neurological: Positive for dizziness. Negative for syncope, weakness and headaches.  Psychiatric/Behavioral: Positive for confusion and sleep disturbance. The patient is nervous/anxious.       Current Outpatient Medications on File Prior to Visit  Medication Sig Dispense Refill  . atorvastatin (LIPITOR) 10 MG tablet TAKE 1 TABLET(10 MG) BY MOUTH DAILY 90 tablet 0  . Cholecalciferol (VITAMIN D3) 2000 units TABS Take 1 capsule by mouth daily.    . clonazePAM (KLONOPIN) 1 MG tablet Take 1 mg by mouth at bedtime.     . diphenoxylate-atropine (LOMOTIL) 2.5-0.025 MG tablet TAKE 2 TABLET BY  MOUTH FOUR TIMES DAILY AS NEEDED FOR DIARRHEA OR LOOSE STOOL 120 tablet 0  . divalproex (DEPAKOTE ER) 250 MG 24 hr tablet Take 1,250 mg by mouth every evening.    . eszopiclone (LUNESTA) 2 MG TABS Take 2 mg by mouth at bedtime as needed for sleep. Take immediately before bedtime    . furosemide (LASIX) 20 MG tablet TAKE 1 TABLET(20 MG)  BY MOUTH DAILY 30 tablet 11  . levothyroxine (SYNTHROID, LEVOTHROID) 112 MCG tablet TAKE 1 TABLET BY MOUTH DAILY 90 tablet 0  . Multiple Vitamins-Minerals (PRESERVISION AREDS 2) CAPS Take 1 capsule by mouth 2 (two) times daily.    . TURMERIC CURCUMIN PO Take by mouth.    . Vitamin D, Ergocalciferol, (DRISDOL) 50000 units CAPS capsule TAKE 1 CAPSULE BY MOUTH EVERY 7 DAYS 12 capsule 0   No current facility-administered medications on file prior to visit.      Past Medical History:  Diagnosis Date  . Depression    Dr. Abner Greenspan  . Diverticulosis of colon   . Endometriosis   . Hx of colonic polyps   . Hyperlipidemia   . Hypothyroidism   . Manic, depressive ([place])   . Menopausal syndrome   . OSA (obstructive sleep apnea)    cpap; 17 cm (clint young)   No Known Allergies  Family History  Problem Relation Age of Onset  . Diabetes Mother   . Heart disease Father        complications of coronary heart disease  . Diabetes Brother   . Heart disease Other   . Cancer Other        BREAST AND LUNG   . Breast cancer Maternal Aunt   . Cancer Maternal Grandfather        LUNG  . Breast cancer Daughter   . Colon cancer Neg Hx   . Rectal cancer Neg Hx   . Stomach cancer Neg Hx     Social History   Socioeconomic History  . Marital status: Married    Spouse name: Not on file  . Number of children: Not on file  . Years of education: Not on file  . Highest education level: Not on file  Occupational History  . Occupation: retired  Scientific laboratory technician  . Financial resource strain: Not on file  . Food insecurity:    Worry: Not on file    Inability: Not on file  . Transportation needs:    Medical: Not on file    Non-medical: Not on file  Tobacco Use  . Smoking status: Former Smoker    Packs/day: 0.10    Years: 10.00    Pack years: 1.00    Types: Cigarettes    Last attempt to quit: 08/21/1983    Years since quitting: 34.7  . Smokeless tobacco: Never Used  . Tobacco comment: only smoked  socially  Substance and Sexual Activity  . Alcohol use: Yes    Alcohol/week: 1.0 - 2.0 standard drinks    Types: 1 - 2 Standard drinks or equivalent per week    Comment: occ glass of wine   . Drug use: No  . Sexual activity: Not on file  Lifestyle  . Physical activity:    Days per week: Not on file    Minutes per session: Not on file  . Stress: Not on file  Relationships  . Social connections:    Talks on phone: Not on file    Gets together: Not on file    Attends  religious service: Not on file    Active member of club or organization: Not on file    Attends meetings of clubs or organizations: Not on file    Relationship status: Not on file  Other Topics Concern  . Not on file  Social History Narrative  . Not on file    Vitals:   05/19/18 1413  BP: 104/70  Pulse: 95  Resp: 16  Temp: 98 F (36.7 C)  SpO2: 98%    Body mass index is 35.04 kg/m.   Physical Exam  Nursing note and vitals reviewed. Constitutional: She appears well-developed. No distress.  HENT:  Head: Normocephalic and atraumatic.  Mouth/Throat: Oropharynx is clear and moist and mucous membranes are normal.  Eyes: Pupils are equal, round, and reactive to light. Conjunctivae are normal.  Cardiovascular: Normal rate and regular rhythm.  No murmur heard. Pulses:      Dorsalis pedis pulses are 2+ on the right side, and 2+ on the left side.  Respiratory: Effort normal and breath sounds normal. No respiratory distress.  GI: Soft. She exhibits no mass. There is no tenderness.  Musculoskeletal: She exhibits edema (1+ pitting LE edema,bilateral). She exhibits no tenderness.  Lymphadenopathy:    She has no cervical adenopathy.  Neurological: She is alert. She has normal strength. She displays tremor (Hed and hands intermittently). No cranial nerve deficit. Gait abnormal.  Disoriented in time. She remembers the president of [place]. Oriented in place and person.  Skin: Skin is warm. No rash noted. No erythema.    Psychiatric: She has a normal mood and affect.  Well groomed, good eye contact.      ASSESSMENT AND PLAN:   Ms. Marquisha was seen today for transfer of care.  Orders Placed This Encounter  Procedures  . Vitamin B12  . VITAMIN D 25 Hydroxy (Vit-D Deficiency, Fractures)  . Ambulatory referral to [facility]     Lab Results  Component Value Date   [id number] 034 05/19/2018    Memory impairment of gradual onset  We discussed possible etiologies. According to husband, she was recently diagnosed with Alzheimer's disease. Depression could also aggravate problem. She did not tolerate Aricept well, for now I would not recommend another medication. Further recommendations will be given according to B12 results.  -     Ambulatory referral to [facility] -     Vitamin B12   Frequent falls She is not using any assistance today. External recommend using her walker.  -     Ambulatory referral to Home Health  Hypothyroidism No changes in current management, will follow labs done today and will give further recommendations accordingly.   Depression Problem seems to be stable. No changes in current management. She will continue following with Dr. Casimiro Needle.  Unstable gait This problem could be aggravated by some of her chronic medical issues, knee OA, as well as some of her medications. Educated about fall prevention. PT evaluation will be arranged through [facility].   Vitamin D deficiency, unspecified No changes in vitamin D3 5000 units daily. Further recommendations will be given according to 25 OH vitamin D results.       Betty G. Martinique, MD  [facility]. [place] office.

## 2018-05-19 NOTE — Assessment & Plan Note (Signed)
Problem seems to be stable. No changes in current management. She will continue following with Dr. Casimiro Needle.

## 2018-05-19 NOTE — Assessment & Plan Note (Signed)
This problem could be aggravated by some of her chronic medical issues, knee OA, as well as some of her medications. Educated about fall prevention. PT evaluation will be arranged through [facility].

## 2018-05-19 NOTE — Patient Instructions (Addendum)
A few things to remember from today's visit:   Unstable gait - Plan: Ambulatory referral to Home Health  Acquired hypothyroidism  Isolated memory impairment - Plan: Vitamin B12  Vitamin D deficiency, unspecified - Plan: VITAMIN D 25 Hydroxy (Vit-D Deficiency, Fractures)  Furosemide to take every other day.    Please be sure medication list is accurate. If a new problem present, please set up appointment sooner than planned today.

## 2018-05-23 ENCOUNTER — Telehealth: Payer: Self-pay | Admitting: Family Medicine

## 2018-05-23 NOTE — Telephone Encounter (Signed)
Message sent to Dr. Jordan for review and approval. 

## 2018-05-23 NOTE — Telephone Encounter (Signed)
Copied from [place] [id number]. Topic: Quick Communication - See Telephone Encounter >> May 23, 2018  2:35 PM Rutherford Nail, [place] wrote: CRM for notification. See Telephone encounter for: 05/23/18. Lattie Haw, physical therapist with [facility] calling and states that physical therapy was started today and would like verbals to continue for balance and strengthening and endurance for the following: 1x a week for 1 week (today) 3x a week for 1 week 2x a week for 3 weeks  CB#: [phone] (can leave a voicemail)

## 2018-05-26 NOTE — Telephone Encounter (Signed)
It is okay to give verbal authorization for PT as requested. Thanks BJ

## 2018-05-26 NOTE — Telephone Encounter (Signed)
Spoke with Lattie Haw and gave verbal orders as requested for PT.

## 2018-05-27 ENCOUNTER — Telehealth: Payer: Self-pay | Admitting: Family Medicine

## 2018-05-27 NOTE — Telephone Encounter (Signed)
Copied from [place] [id number]. Topic: Quick Communication - See Telephone Encounter >> May 27, 2018  4:24 PM Bea Graff, [place] wrote: CRM for notification. See Telephone encounter for: 05/27/18. [place] calling to report pt had a couple falls in the past couple weeks and that the pts left leg has some swelling and some pain. The right has some swelling, but left is worse than tight. She was seen on 9/30 for unstable gait. CB#: [phone]

## 2018-05-28 NOTE — Telephone Encounter (Signed)
Spoke with patient's husband and he stated that she is still c/o pain. Patient scheduled for 05/30/18 to discuss pain and have x-rays done to make sure she didn't break anything.

## 2018-05-28 NOTE — Telephone Encounter (Signed)
Message sent to Dr. Jordan for review. 

## 2018-05-28 NOTE — Telephone Encounter (Signed)
If she is still having pain and local edema, she needs to be seen. She may need imaging done. Thanks, BJ

## 2018-05-30 ENCOUNTER — Encounter: Payer: Self-pay | Admitting: Family Medicine

## 2018-05-30 ENCOUNTER — Ambulatory Visit (INDEPENDENT_AMBULATORY_CARE_PROVIDER_SITE_OTHER): Payer: Medicare Other | Admitting: Family Medicine

## 2018-05-30 ENCOUNTER — Encounter (HOSPITAL_COMMUNITY): Payer: Medicare Other

## 2018-05-30 ENCOUNTER — Ambulatory Visit (INDEPENDENT_AMBULATORY_CARE_PROVIDER_SITE_OTHER): Payer: Medicare Other

## 2018-05-30 VITALS — BP 122/76 | HR 94 | Temp 98.4°F | Resp 16 | Ht 62.25 in | Wt 192.2 lb

## 2018-05-30 DIAGNOSIS — M25562 Pain in left knee: Secondary | ICD-10-CM

## 2018-05-30 DIAGNOSIS — W19XXXD Unspecified fall, subsequent encounter: Secondary | ICD-10-CM

## 2018-05-30 DIAGNOSIS — M545 Low back pain, unspecified: Secondary | ICD-10-CM

## 2018-05-30 DIAGNOSIS — M79605 Pain in left leg: Secondary | ICD-10-CM

## 2018-05-30 NOTE — Progress Notes (Signed)
ACUTE VISIT   HPI:  Chief Complaint  Patient presents with  . Leg Pain    left leg pain and swelling due to multipe falls recently, pain in buttocks    Emily Castaneda is a 83 y.o. female, who is here today with her husband, who is concerned about left leg pain. Because no history of dementia, most history was provided by her husband.  Her husband is requesting knee X ray done here in the office today. Pain on posterior aspect of knee.  It seems to be worse when she stands up after prolonged sitting. Alleviated by rest. Not sure about type of pain or intensity.  She denies lower extremity edema but it was reported on recent phone call. He has not noted erythema. Pain started 2 days after her last fall, about 2 weeks ago.  No recent surgery or long travel.   Her husband is also concerned about lower back pain, would like imaging done. Not sure about frequency or level of pain. No changes in mobility. It seems to be exacerbated by prolonged standing and walking. Also when getting up and sitting on the couch. No changes in urine or bowel continence.  Alleviated by rest.   Hx of OA, she uses a cane for assistance.  She has not complained of chest pain,palpitations,or dyspnea. No changes in appetite or MS.   Review of Systems  Constitutional: Negative for chills and fever.  Respiratory: Negative for shortness of breath and wheezing.   Cardiovascular: Positive for leg swelling. Negative for palpitations.  Gastrointestinal: Negative for abdominal pain, nausea and vomiting.  Genitourinary: Negative for decreased urine volume, dysuria and hematuria.  Musculoskeletal: Positive for arthralgias, back pain and gait problem. Negative for joint swelling.  Skin: Negative for color change and pallor.  Neurological: Negative for weakness and numbness.  Psychiatric/Behavioral: Negative for confusion. The patient is nervous/anxious.       Current Outpatient  Medications on File Prior to Visit  Medication Sig Dispense Refill  . atorvastatin (LIPITOR) 10 MG tablet TAKE 1 TABLET(10 MG) BY MOUTH DAILY 90 tablet 0  . Cholecalciferol (VITAMIN D3) 2000 units TABS Take 1 capsule by mouth daily.    . clonazePAM (KLONOPIN) 1 MG tablet Take 1 mg by mouth at bedtime.     . diphenoxylate-atropine (LOMOTIL) 2.5-0.025 MG tablet TAKE 2 TABLET BY MOUTH FOUR TIMES DAILY AS NEEDED FOR DIARRHEA OR LOOSE STOOL 120 tablet 0  . divalproex (DEPAKOTE ER) 250 MG 24 hr tablet Take 1,250 mg by mouth every evening.    . eszopiclone (LUNESTA) 2 MG TABS Take 2 mg by mouth at bedtime as needed for sleep. Take immediately before bedtime    . furosemide (LASIX) 20 MG tablet TAKE 1 TABLET(20 MG) BY MOUTH DAILY 30 tablet 11  . levothyroxine (SYNTHROID, LEVOTHROID) 112 MCG tablet TAKE 1 TABLET BY MOUTH DAILY 90 tablet 0  . Multiple Vitamins-Minerals (PRESERVISION AREDS 2) CAPS Take 1 capsule by mouth 2 (two) times daily.    . TURMERIC CURCUMIN PO Take by mouth.    . Vitamin D, Ergocalciferol, (DRISDOL) 50000 units CAPS capsule TAKE 1 CAPSULE BY MOUTH EVERY 7 DAYS 12 capsule 0   No current facility-administered medications on file prior to visit.      Past Medical History:  Diagnosis Date  . Depression    Dr. Abner Greenspan  . Diverticulosis of colon   . Endometriosis   . Hx of colonic polyps   . Hyperlipidemia   .  Hypothyroidism   . Manic, depressive ([place])   . Menopausal syndrome   . OSA (obstructive sleep apnea)    cpap; 17 cm (clint young)   No Known Allergies  Social History   Socioeconomic History  . Marital status: Married    Spouse name: Not on file  . Number of children: Not on file  . Years of education: Not on file  . Highest education level: Not on file  Occupational History  . Occupation: retired  Scientific laboratory technician  . Financial resource strain: Not on file  . Food insecurity:    Worry: Not on file    Inability: Not on file  . Transportation needs:     Medical: Not on file    Non-medical: Not on file  Tobacco Use  . Smoking status: Former Smoker    Packs/day: 0.10    Years: 10.00    Pack years: 1.00    Types: Cigarettes    Last attempt to quit: 08/21/1983    Years since quitting: 34.8  . Smokeless tobacco: Never Used  . Tobacco comment: only smoked socially  Substance and Sexual Activity  . Alcohol use: Yes    Alcohol/week: 1.0 - 2.0 standard drinks    Types: 1 - 2 Standard drinks or equivalent per week    Comment: occ glass of wine   . Drug use: No  . Sexual activity: Not on file  Lifestyle  . Physical activity:    Days per week: Not on file    Minutes per session: Not on file  . Stress: Not on file  Relationships  . Social connections:    Talks on phone: Not on file    Gets together: Not on file    Attends religious service: Not on file    Active member of club or organization: Not on file    Attends meetings of clubs or organizations: Not on file    Relationship status: Not on file  Other Topics Concern  . Not on file  Social History Narrative  . Not on file    Vitals:   05/30/18 1114  BP: 122/76  Pulse: 94  Resp: 16  Temp: 98.4 F (36.9 C)  SpO2: 98%   Body mass index is 34.88 kg/m.   Physical Exam  Nursing note and vitals reviewed. Constitutional: She appears well-developed. She is cooperative. She does not appear ill. No distress.  HENT:  Head: Normocephalic and atraumatic.  Eyes: Conjunctivae are normal.  Cardiovascular: Normal rate and regular rhythm.  Pulses:      Dorsalis pedis pulses are 2+ on the right side, and 2+ on the left side.  Calf tenderness : Mild upon palpation. Homan's sign negative. DP and PT pulses present.  Respiratory: Effort normal. No respiratory distress.  Musculoskeletal: She exhibits no edema.       Lumbar back: She exhibits no tenderness.       Legs: Left knee: on inspection no effusion, erythema, or deformities. Fullness of popliteal fossa, there is tenderness upon  palpation. No significant difference between legs circumference.  Neurological: She is alert. She has normal strength.  Unstable gait assisted with a cane. Oriented in place and person.  Skin: Skin is warm. No rash noted. No erythema.  Psychiatric: She has a normal mood and affect.  Well groomed, good eye contact.      ASSESSMENT AND PLAN:   Ms. Reily was seen today for leg pain.  Diagnoses and all orders for this visit:  Leg pain,  left  Pain seems to be from knee to calf left LE. Possible etiologies discussed. Because pain upon palpation of calf, LLE [place] was recommended to evaluate for DVT.  -     VAS [place] LOWER EXTREMITY VENOUS (DVT); Future  Fall, subsequent encounter  She has risk factors for falls: OA,unstable gait,and medications.  She has not had a fall in 2 weeks. Educated about fall precautions.  -     DG Lumbar Spine Complete; Future  Left knee pain, unspecified chronicity  Hx of OA. Not clear about direct trauma. Further recommendations will be given according to imaging results.  -     DG Knee Complete 4 Views Left; Future  Low back pain, unspecified back pain laterality, unspecified chronicity, unspecified whether sciatica present  Pain was not elicited during examination today. Further recommendations will be given according to lumbar X ray results.     Difficulty with gathering Hx , husband is not sure about characteristics of pain and she cannot provide Hx due to dementia.  Her husband asked [place] to cancel appointment for lower extremity ultrasound, he has another appointment today and will schedule appointment as soon as possible for next week. He understands the reason [place] was ordered and risk of complications in case of thrombotic event.    Return if symptoms worsen or fail to improve.     Mckynna Vanloan G. [place], MD  [facility]. [place] office.

## 2018-05-30 NOTE — Patient Instructions (Signed)
A few things to remember from today's visit:   Leg pain, left - Plan: VAS [place] LOWER EXTREMITY VENOUS (DVT)  Fall, subsequent encounter  Left knee pain, unspecified chronicity - Plan: DG Knee Complete 4 Views Left  If pain gets worse suddenly, red, or swelling get worse she needs to go to the ER. Also if chest pain, palpitation, or shortness of breath.   Please be sure medication list is accurate. If a new problem present, please set up appointment sooner than planned today.

## 2018-06-04 ENCOUNTER — Ambulatory Visit (HOSPITAL_COMMUNITY)
Admission: RE | Admit: 2018-06-04 | Discharge: 2018-06-04 | Disposition: A | Discharge status: Home / Self Care | Payer: Medicare Other | Source: Ambulatory Visit | Attending: Internal Medicine | Admitting: Internal Medicine

## 2018-06-04 DIAGNOSIS — M79605 Pain in left leg: Secondary | ICD-10-CM | POA: Diagnosis present

## 2018-06-05 ENCOUNTER — Other Ambulatory Visit: Payer: Self-pay | Admitting: Internal Medicine

## 2018-06-23 ENCOUNTER — Telehealth: Payer: Self-pay | Admitting: *Deleted

## 2018-06-23 NOTE — Telephone Encounter (Signed)
It is fine with me. Thanks, BK

## 2018-06-23 NOTE — Telephone Encounter (Signed)
Copied from [place] [id number]. Topic: Appointment Scheduling - Transfer of Care >> Jun 23, 2018 11:38 AM Lennox Solders wrote: Pt is requesting to transfer FROM: Emily Castaneda Pt is requesting to transfer TO: dr Deborra Medina Reason for requested transfer: pt is closer to [place] than [place] location.    Send CRM to patient's current PCP (transferring FROM).

## 2018-06-23 NOTE — Telephone Encounter (Signed)
Please have her establish with Dr. Bryan Lemma or Dr. Zigmund Daniel as we are trying to build up their practices.

## 2018-07-04 ENCOUNTER — Ambulatory Visit (INDEPENDENT_AMBULATORY_CARE_PROVIDER_SITE_OTHER): Payer: Medicare Other | Admitting: Family Medicine

## 2018-07-04 ENCOUNTER — Encounter: Payer: Self-pay | Admitting: Family Medicine

## 2018-07-04 VITALS — BP 140/90 | HR 100 | Temp 98.0°F | Ht 62.25 in | Wt 187.4 lb

## 2018-07-04 DIAGNOSIS — F319 Bipolar disorder, unspecified: Secondary | ICD-10-CM

## 2018-07-04 DIAGNOSIS — R2681 Unsteadiness on feet: Secondary | ICD-10-CM

## 2018-07-04 DIAGNOSIS — Z7689 Persons encountering health services in other specified circumstances: Secondary | ICD-10-CM

## 2018-07-04 DIAGNOSIS — M1711 Unilateral primary osteoarthritis, right knee: Secondary | ICD-10-CM

## 2018-07-04 DIAGNOSIS — E039 Hypothyroidism, unspecified: Secondary | ICD-10-CM

## 2018-07-04 DIAGNOSIS — M1712 Unilateral primary osteoarthritis, left knee: Secondary | ICD-10-CM | POA: Diagnosis not present

## 2018-07-04 DIAGNOSIS — E785 Hyperlipidemia, unspecified: Secondary | ICD-10-CM

## 2018-07-04 NOTE — Progress Notes (Signed)
Emily Castaneda is a 83 y.o. female  Chief Complaint  Patient presents with  . Establish Care    here to establish care, pt is bipolar and uses cpap at night     HPI: Emily Castaneda is a 83 y.o. female here as a new patient to our office (transferring from [place] d/t proximity to home) to establish care. Her husband is with her today. She has no concerns/issues today.   Specialists: ophthalmology (10/2017); sports med (Dr. Tamala Julian) - OA B/L knee, Rt hip; GI (Dr. Loletha Carrow) - chronic diarrhea, fecal incontinence, last seen in 02/2017; pulmonary (Dr. Elsworth Soho) - OSA; psych (Dr. Lenice Pressman) - bipolar; seen every 3-38mo  Last CPE, labs: 09/2017  Last PAP: n/a Last mammo: 06/2017 - pt has gone annually and has had normal mammo; she would no longer like to undergo mammograms Last Dexa: 02/2013 Last colonoscopy: 07/2012  Med refills needed today: none  She had her flu vaccine for this year.   Past Medical History:  Diagnosis Date  . Depression    Dr. Abner Greenspan  . Diverticulosis of colon   . Endometriosis   . Hx of colonic polyps   . Hyperlipidemia   . Hypothyroidism   . Manic, depressive ([place])   . Menopausal syndrome   . OSA (obstructive sleep apnea)    cpap; 17 cm (clint young)    Past Surgical History:  Procedure Laterality Date  . APPENDECTOMY    . COLONOSCOPY    . POLYPECTOMY     Dr. Ubaldo Glassing  . TONSILLECTOMY AND ADENOIDECTOMY      Social History   Socioeconomic History  . Marital status: Married    Spouse name: Not on file  . Number of children: Not on file  . Years of education: Not on file  . Highest education level: Not on file  Occupational History  . Occupation: retired  Scientific laboratory technician  . Financial resource strain: Not on file  . Food insecurity:    Worry: Not on file    Inability: Not on file  . Transportation needs:    Medical: Not on file    Non-medical: Not on file  Tobacco Use  . Smoking status: Former Smoker    Packs/day: 0.10    Years: 10.00    Pack years:  1.00    Types: Cigarettes    Last attempt to quit: 08/21/1983    Years since quitting: 34.8  . Smokeless tobacco: Never Used  . Tobacco comment: only smoked socially  Substance and Sexual Activity  . Alcohol use: Yes    Alcohol/week: 1.0 - 2.0 standard drinks    Types: 1 - 2 Standard drinks or equivalent per week    Comment: occ glass of wine   . Drug use: No  . Sexual activity: Not on file  Lifestyle  . Physical activity:    Days per week: Not on file    Minutes per session: Not on file  . Stress: Not on file  Relationships  . Social connections:    Talks on phone: Not on file    Gets together: Not on file    Attends religious service: Not on file    Active member of club or organization: Not on file    Attends meetings of clubs or organizations: Not on file    Relationship status: Not on file  . Intimate partner violence:    Fear of current or ex partner: Not on file    Emotionally abused: Not on  file    Physically abused: Not on file    Forced sexual activity: Not on file  Other Topics Concern  . Not on file  Social History Narrative  . Not on file    Family History  Problem Relation Age of Onset  . Diabetes Mother   . Heart disease Father        complications of coronary heart disease  . Diabetes Brother   . Heart disease Other   . Cancer Other        BREAST AND LUNG   . Breast cancer Maternal Aunt   . Cancer Maternal Grandfather        LUNG  . Breast cancer Daughter   . Colon cancer Neg Hx   . Rectal cancer Neg Hx   . Stomach cancer Neg Hx      Immunization History  Administered Date(s) Administered  . Influenza Split 09/20/2013  . Influenza, High Dose Seasonal PF 08/07/2016, 09/18/2017  . Pneumococcal Conjugate-13 09/11/2013  . Pneumococcal Polysaccharide-23 02/25/2009  . Td 02/25/2009  . Zoster 12/10/2012    Outpatient Encounter Medications as of 07/04/2018  Medication Sig  . atorvastatin (LIPITOR) 10 MG tablet TAKE 1 TABLET(10 MG) BY MOUTH  DAILY  . Cholecalciferol (VITAMIN D3) 2000 units TABS Take 1 capsule by mouth daily.  . clonazePAM (KLONOPIN) 1 MG tablet Take 1 mg by mouth at bedtime.   . diphenoxylate-atropine (LOMOTIL) 2.5-0.025 MG tablet TAKE 2 TABLET BY MOUTH FOUR TIMES DAILY AS NEEDED FOR DIARRHEA OR LOOSE STOOL  . divalproex (DEPAKOTE ER) 250 MG 24 hr tablet Take 1,250 mg by mouth every evening.  . eszopiclone (LUNESTA) 2 MG TABS Take 2 mg by mouth at bedtime as needed for sleep. Take immediately before bedtime  . furosemide (LASIX) 20 MG tablet TAKE 1 TABLET(20 MG) BY MOUTH DAILY  . levothyroxine (SYNTHROID, LEVOTHROID) 112 MCG tablet TAKE 1 TABLET BY MOUTH DAILY  . Multiple Vitamins-Minerals (PRESERVISION AREDS 2) CAPS Take 1 capsule by mouth 2 (two) times daily.  . TURMERIC CURCUMIN PO Take by mouth.  . Vitamin D, Ergocalciferol, (DRISDOL) 50000 units CAPS capsule TAKE 1 CAPSULE BY MOUTH EVERY 7 DAYS   No facility-administered encounter medications on file as of 07/04/2018.      ROS: Gen: no fever, chills; + fatigue - chronic  Skin: no rash, itching ENT: no ear pain, ear drainage, nasal congestion, rhinorrhea, sinus pressure, sore throat Eyes: no blurry vision, double vision Resp: no cough, wheeze,SOB CV: no CP, palpitations, LE edema,  GI: no heartburn, no n/v; + diarrhea - chronic, + occasional abdominal pain - chronic GU: no dysuria, urgency, frequency, hematuria  MSK: + B/L knee pain; no myalgias, back pain Neuro: no dizziness, headache, weakness, vertigo Psych: + depression, no insomnia   No Known Allergies  BP 140/90 (BP Location: Left Arm, Patient Position: Sitting, Cuff Size: Normal)   Pulse 100   Temp 98 F (36.7 C) (Oral)   Ht 5' 2.25" (1.581 m)   Wt 187 lb 6.4 oz (85 kg)   SpO2 98%   BMI 34.00 kg/m   BP Readings from Last 3 Encounters:  07/04/18 140/90  05/30/18 122/76  05/19/18 104/70    Physical Exam  Constitutional: She is oriented to person, place, and time. She appears  well-developed and well-nourished. No distress.  Neck: Neck supple. No thyromegaly present.  Cardiovascular: Normal rate, regular rhythm and normal heart sounds.  Pulmonary/Chest: Effort normal and breath sounds normal. No respiratory distress.  Abdominal: Soft.  Bowel sounds are normal. She exhibits no distension. There is no tenderness.  Musculoskeletal: She exhibits no edema.  Lymphadenopathy:    She has no cervical adenopathy.  Neurological: She is alert and oriented to person, place, and time.  Skin: Skin is warm and dry.  Psychiatric: She has a normal mood and affect.     A/P:  1. Encounter to establish care with new doctor - due for CPE, labs in 09/2018 - she is UTD on flu vaccine  2. Acquired hypothyroidism - will check TSH in 09/2018 - cont current med/dose  3. Primary osteoarthritis of left knee 4. Primary osteoarthritis of right knee - follows with sport med, has improvement in symptoms with steroid injection  5. Dyslipidemia - cont statin - will check FLP at OV in 3 mo  6. Unstable gait - ambulates with cane - not currently doing PT and declines referral at this time  7. Bipolar 1 disorder, depressed ([place]) - follows q22mo with psych - cont current meds - PHQ-9 = 0 today

## 2018-08-28 ENCOUNTER — Ambulatory Visit (INDEPENDENT_AMBULATORY_CARE_PROVIDER_SITE_OTHER): Payer: Medicare Other | Admitting: Family Medicine

## 2018-08-28 ENCOUNTER — Encounter: Payer: Self-pay | Admitting: Family Medicine

## 2018-08-28 VITALS — BP 132/82 | HR 90

## 2018-08-28 DIAGNOSIS — H8113 Benign paroxysmal vertigo, bilateral: Secondary | ICD-10-CM

## 2018-08-28 NOTE — Progress Notes (Signed)
[name] is a 84 y.o. female  No chief complaint on file.   HPI: Emily Castaneda is a 84 y.o. female complains of lightheadedness and dizziness when she sits up to get out of bed or if she bends forward and sits back up. Symptoms resolve if pt sits still for a min or so. Symptoms began 1.5 wks ago.  No associated symptoms. No nausea or vomiting. No headache. No vision changes. No recent URI.   Pt states her psychiatrist sent her for CT head due to issues with memory loss and pts states CT head was negative/normal. She is unsure of when this was. I see a CT head in 01/2018 in EMR that was normal.   Pt states her husband has been checking her BP at home and it has been running "low" but she is unsure of BP readings.   Past Medical History:  Diagnosis Date  . Depression    Dr. Abner Greenspan  . Diverticulosis of colon   . Endometriosis   . Hx of colonic polyps   . Hyperlipidemia   . Hypothyroidism   . Manic, depressive ([place])   . Menopausal syndrome   . OSA (obstructive sleep apnea)    cpap; 17 cm (clint young)    Past Surgical History:  Procedure Laterality Date  . APPENDECTOMY    . COLONOSCOPY    . POLYPECTOMY     Dr. Ubaldo Glassing  . TONSILLECTOMY AND ADENOIDECTOMY      Social History   Socioeconomic History  . Marital status: Married    Spouse name: Not on file  . Number of children: Not on file  . Years of education: Not on file  . Highest education level: Not on file  Occupational History  . Occupation: retired  Scientific laboratory technician  . Financial resource strain: Not on file  . Food insecurity:    Worry: Not on file    Inability: Not on file  . Transportation needs:    Medical: Not on file    Non-medical: Not on file  Tobacco Use  . Smoking status: Former Smoker    Packs/day: 0.10    Years: 10.00    Pack years: 1.00    Types: Cigarettes    Last attempt to quit: 08/21/1983    Years since quitting: 35.0  . Smokeless tobacco: Never Used  . Tobacco comment: only smoked  socially  Substance and Sexual Activity  . Alcohol use: Yes    Alcohol/week: 1.0 - 2.0 standard drinks    Types: 1 - 2 Standard drinks or equivalent per week    Comment: occ glass of wine   . Drug use: No  . Sexual activity: Not on file  Lifestyle  . Physical activity:    Days per week: Not on file    Minutes per session: Not on file  . Stress: Not on file  Relationships  . Social connections:    Talks on phone: Not on file    Gets together: Not on file    Attends religious service: Not on file    Active member of club or organization: Not on file    Attends meetings of clubs or organizations: Not on file    Relationship status: Not on file  . Intimate partner violence:    Fear of current or ex partner: Not on file    Emotionally abused: Not on file    Physically abused: Not on file    Forced sexual activity: Not on file  Other Topics Concern  . Not on file  Social History Narrative  . Not on file    Family History  Problem Relation Age of Onset  . Diabetes Mother   . Heart disease Father        complications of coronary heart disease  . Diabetes Brother   . Heart disease Other   . Cancer Other        BREAST AND LUNG   . Breast cancer Maternal Aunt   . Cancer Maternal Grandfather        LUNG  . Breast cancer Daughter   . Colon cancer Neg Hx   . Rectal cancer Neg Hx   . Stomach cancer Neg Hx      Immunization History  Administered Date(s) Administered  . Influenza Split 09/20/2013  . Influenza, High Dose Seasonal PF 08/07/2016, 09/18/2017  . Pneumococcal Conjugate-13 09/11/2013  . Pneumococcal Polysaccharide-23 02/25/2009  . Td 02/25/2009  . Zoster 12/10/2012    Outpatient Encounter Medications as of 08/28/2018  Medication Sig  . atorvastatin (LIPITOR) 10 MG tablet TAKE 1 TABLET(10 MG) BY MOUTH DAILY  . Cholecalciferol (VITAMIN D3) 2000 units TABS Take 1 capsule by mouth daily.  . clonazePAM (KLONOPIN) 1 MG tablet Take 1 mg by mouth at bedtime.   .  diphenoxylate-atropine (LOMOTIL) 2.5-0.025 MG tablet TAKE 2 TABLET BY MOUTH FOUR TIMES DAILY AS NEEDED FOR DIARRHEA OR LOOSE STOOL  . divalproex (DEPAKOTE ER) 250 MG 24 hr tablet Take 1,250 mg by mouth every evening.  . eszopiclone (LUNESTA) 2 MG TABS Take 2 mg by mouth at bedtime as needed for sleep. Take immediately before bedtime  . furosemide (LASIX) 20 MG tablet TAKE 1 TABLET(20 MG) BY MOUTH DAILY  . levothyroxine (SYNTHROID, LEVOTHROID) 112 MCG tablet TAKE 1 TABLET BY MOUTH DAILY  . Multiple Vitamins-Minerals (PRESERVISION AREDS 2) CAPS Take 1 capsule by mouth 2 (two) times daily.  . TURMERIC CURCUMIN PO Take by mouth.  . Vitamin D, Ergocalciferol, (DRISDOL) 50000 units CAPS capsule TAKE 1 CAPSULE BY MOUTH EVERY 7 DAYS   No facility-administered encounter medications on file as of 08/28/2018.      ROS: Pertinent positives and negatives noted in HPI. Remainder of ROS non-contributory   No Known Allergies  BP 132/82   Pulse 90   BP Readings from Last 3 Encounters:  08/28/18 132/82  07/04/18 140/90  05/30/18 122/76   Pulse Readings from Last 3 Encounters:  08/28/18 90  07/04/18 100  05/30/18 94     Physical Exam  Constitutional: She is oriented to person, place, and time. She appears well-developed and well-nourished. No distress.  HENT:  Head: Normocephalic and atraumatic.  Eyes: Pupils are equal, round, and reactive to light. Conjunctivae and EOM are normal.  Neck: Neck supple.  Cardiovascular: Normal rate and regular rhythm.  Pulmonary/Chest: Effort normal and breath sounds normal. No respiratory distress.  Musculoskeletal:        General: No edema.  Lymphadenopathy:    She has no cervical adenopathy.  Neurological: She is alert and oriented to person, place, and time. She has normal strength. No cranial nerve deficit or sensory deficit. Coordination (pt does ambulate with walker) normal.  Psychiatric: She has a normal mood and affect. Her behavior is normal.  No  nystagmus   A/P:  1. BPPV (benign paroxysmal positional vertigo), bilateral - discussed dx with pt - stressed importance of slow position change, hydration, rest - handout with Imagene Sheller exercises reviewed with and given to pt  to do at home - f/u if symptoms worsen or do not improve in 2-3 wks - pt symptoms resolve in less than 1 min with rest so no role for antivert or valium or other at this time Discussed plan and reviewed medications with patient, including risks, benefits, and potential side effects. Pt expressed understand. All questions answered.  I spent 25 min with the patient today and greater than 50% was spent in counseling, coordination of care, education

## 2018-08-28 NOTE — Patient Instructions (Signed)
Drink more water  Do exercises - included in handout Change positions slowly and carefully   Benign Positional Vertigo Vertigo is the feeling that you or your surroundings are moving when they are not. Benign positional vertigo is the most common form of vertigo. This is usually a harmless condition (benign). This condition is positional. This means that symptoms are triggered by certain movements and positions. This condition can be dangerous if it occurs while you are doing something that could cause harm to you or others. This includes activities such as driving or operating machinery. What are the causes? In many cases, the cause of this condition is not known. It may be caused by a disturbance in an area of the inner ear that helps your brain to sense movement and balance. This disturbance can be caused by:  Viral infection (labyrinthitis).  Head injury.  Repetitive motion, such as jumping, dancing, or running. What increases the risk? You are more likely to develop this condition if:  You are a woman.  You are 63 years of age or older. What are the signs or symptoms? Symptoms of this condition usually happen when you move your head or your eyes in different directions. Symptoms may start suddenly, and usually last for less than a minute. They include:  Loss of balance and falling.  Feeling like you are spinning or moving.  Feeling like your surroundings are spinning or moving.  Nausea and vomiting.  Blurred vision.  Dizziness.  Involuntary eye movement (nystagmus). Symptoms can be mild and cause only minor problems, or they can be severe and interfere with daily life. Episodes of benign positional vertigo may return (recur) over time. Symptoms may improve over time. How is this diagnosed? This condition may be diagnosed based on:  Your medical history.  Physical exam of the head, neck, and ears.  Tests, such as: ? MRI. ? CT scan. ? Eye movement tests. Your  health care provider may ask you to change positions quickly while he or she watches you for symptoms of benign positional vertigo, such as nystagmus. Eye movement may be tested with a variety of exams that are designed to evaluate or stimulate vertigo. ? An electroencephalogram (EEG). This records electrical activity in your brain. ? Hearing tests. You may be referred to a health care provider who specializes in ear, nose, and throat (ENT) problems (otolaryngologist) or a provider who specializes in disorders of the nervous system (neurologist). How is this treated?  This condition may be treated in a session in which your health care provider moves your head in specific positions to adjust your inner ear back to normal. Treatment for this condition may take several sessions. Surgery may be needed in severe cases, but this is rare. In some cases, benign positional vertigo may resolve on its own in 2-4 weeks. Follow these instructions at home: Safety  Move slowly. Avoid sudden body or head movements or certain positions, as told by your health care provider.  Avoid driving until your health care provider says it is safe for you to do so.  Avoid operating heavy machinery until your health care provider says it is safe for you to do so.  Avoid doing any tasks that would be dangerous to you or others if vertigo occurs.  If you have trouble walking or keeping your balance, try using a cane for stability. If you feel dizzy or unstable, sit down right away.  Return to your normal activities as told by your health care  provider. Ask your health care provider what activities are safe for you. General instructions  Take over-the-counter and prescription medicines only as told by your health care provider.  Drink enough fluid to keep your urine pale yellow.  Keep all follow-up visits as told by your health care provider. This is important. Contact a health care provider if:  You have a  fever.  Your condition gets worse or you develop new symptoms.  Your family or friends notice any behavioral changes.  You have nausea or vomiting that gets worse.  You have numbness or a "pins and needles" sensation. Get help right away if you:  Have difficulty speaking or moving.  Are always dizzy.  Faint.  Develop severe headaches.  Have weakness in your legs or arms.  Have changes in your hearing or vision.  Develop a stiff neck.  Develop sensitivity to light. Summary  Vertigo is the feeling that you or your surroundings are moving when they are not. Benign positional vertigo is the most common form of vertigo.  The cause of this condition is not known. It may be caused by a disturbance in an area of the inner ear that helps your brain to sense movement and balance.  Symptoms include loss of balance and falling, feeling that you or your surroundings are moving, nausea and vomiting, and blurred vision.  This condition can be diagnosed based on symptoms, physical exam, and other tests, such as MRI, CT scan, eye movement tests, and hearing tests.  Follow safety instructions as told by your health care provider. You will also be told when to contact your health care provider in case of problems. This information is not intended to replace advice given to you by your health care provider. Make sure you discuss any questions you have with your health care provider. Document Released: 05/14/2006 Document Revised: 01/15/2018 Document Reviewed: 01/15/2018 Elsevier Interactive Patient Education  2019 [employer].

## 2018-10-02 ENCOUNTER — Ambulatory Visit (INDEPENDENT_AMBULATORY_CARE_PROVIDER_SITE_OTHER): Payer: Medicare Other | Admitting: Family Medicine

## 2018-10-02 ENCOUNTER — Encounter: Payer: Self-pay | Admitting: Family Medicine

## 2018-10-02 VITALS — BP 118/78 | HR 83 | Temp 97.9°F | Ht 62.25 in | Wt 193.4 lb

## 2018-10-02 DIAGNOSIS — E785 Hyperlipidemia, unspecified: Secondary | ICD-10-CM

## 2018-10-02 DIAGNOSIS — E559 Vitamin D deficiency, unspecified: Secondary | ICD-10-CM

## 2018-10-02 DIAGNOSIS — F319 Bipolar disorder, unspecified: Secondary | ICD-10-CM

## 2018-10-02 DIAGNOSIS — E039 Hypothyroidism, unspecified: Secondary | ICD-10-CM

## 2018-10-02 DIAGNOSIS — E538 Deficiency of other specified B group vitamins: Secondary | ICD-10-CM

## 2018-10-02 DIAGNOSIS — R600 Localized edema: Secondary | ICD-10-CM

## 2018-10-02 LAB — T4, FREE: Free T4: 1.02 ng/dL (ref 0.60–1.60)

## 2018-10-02 LAB — COMPREHENSIVE METABOLIC PANEL
ALT: 15 U/L (ref 0–35)
AST: 17 U/L (ref 0–37)
Albumin: 3.8 g/dL (ref 3.5–5.2)
Alkaline Phosphatase: 43 U/L (ref 39–117)
BILIRUBIN TOTAL: 0.4 mg/dL (ref 0.2–1.2)
BUN: 20 mg/dL (ref 6–23)
CO2: 29 mEq/L (ref 19–32)
Calcium: 9.2 mg/dL (ref 8.4–10.5)
Chloride: 106 mEq/L (ref 96–112)
Creatinine, Ser: 0.7 mg/dL (ref 0.40–1.20)
GFR: 80.17 mL/min (ref >60.00)
Glucose, Bld: 96 mg/dL (ref 70–99)
Potassium: 4 mEq/L (ref 3.5–5.1)
SODIUM: 143 meq/L (ref 135–145)
Total Protein: 6.1 g/dL (ref 6.0–8.3)

## 2018-10-02 LAB — LIPID PANEL
Cholesterol: 186 mg/dL (ref 0–200)
HDL: 44.1 mg/dL (ref >39.00)
NonHDL: 141.81
Total CHOL/HDL Ratio: 4
Triglycerides: 233 mg/dL — ABNORMAL HIGH (ref 0.0–149.0)
VLDL: 46.6 mg/dL — ABNORMAL HIGH (ref 0.0–40.0)

## 2018-10-02 LAB — TSH: TSH: 4.13 u[IU]/mL (ref 0.35–4.50)

## 2018-10-02 LAB — VITAMIN B12: Vitamin B-12: 524 pg/mL (ref 211–911)

## 2018-10-02 LAB — LDL CHOLESTEROL, DIRECT: Direct LDL: 119 mg/dL

## 2018-10-02 MED ORDER — DIPHENOXYLATE-ATROPINE 2.5-0.025 MG PO TABS: ORAL_TABLET | ORAL | 0 refills | Status: DC

## 2018-10-02 MED ORDER — FUROSEMIDE 20 MG PO TABS: 20.0000 mg | ORAL_TABLET | Freq: Every day | ORAL | 3 refills | Status: DC

## 2018-10-02 NOTE — Patient Instructions (Addendum)
Take lasix (furosemide) 20mg  1 tab daily x 1 week then stop Restart if legs swell again  Current Outpatient Medications on File Prior to Visit  Medication Sig Dispense Refill  . atorvastatin (LIPITOR) 10 MG tablet TAKE 1 TABLET(10 MG) BY MOUTH DAILY 90 tablet 0  . Cholecalciferol (VITAMIN D3) 2000 units TABS Take 1 capsule by mouth daily.    . clonazePAM (KLONOPIN) 1 MG tablet Take 1 mg by mouth at bedtime.     . divalproex (DEPAKOTE ER) 250 MG 24 hr tablet Take 1,250 mg by mouth every evening.    . eszopiclone (LUNESTA) 2 MG TABS Take 2 mg by mouth at bedtime as needed for sleep. Take immediately before bedtime    . levothyroxine (SYNTHROID, LEVOTHROID) 112 MCG tablet TAKE 1 TABLET BY MOUTH DAILY 90 tablet 0  . Multiple Vitamins-Minerals (PRESERVISION AREDS 2) CAPS Take 1 capsule by mouth 2 (two) times daily.    . rivastigmine (EXELON) 13.3 MG/24HR APPLY 1 PATCH TO SKIN QAM    . TURMERIC CURCUMIN PO Take by mouth.    . Vitamin D, Ergocalciferol, (DRISDOL) 50000 units CAPS capsule TAKE 1 CAPSULE BY MOUTH EVERY 7 DAYS 12 capsule 0   No current facility-administered medications on file prior to visit.

## 2018-10-02 NOTE — Progress Notes (Signed)
Emily Castaneda is a 84 y.o. female  Chief Complaint  Patient presents with  . Annual Exam    CPE -- Fasting/ Mammorgram 2019/ needs rx refill of Lomotal     HPI: Emily Castaneda is a 84 y.o. female here for follow-up on her chronic medical issues including hyperlipidemia, hypothyroidism, Vit D deficiency, B/L knee OA, depression.   Pt is fasting for labs today. She had Vit D and B12 done in 04/2018. She is due for lipid panel, CMP, TFTs.  She has appt with [facility] (Dr. Clearance Coots) tomorrow for her knee OA.  She saw her psychiatrist last week and Exelon patch was prescribed. Dr. Lenice Pressman. She has a f/u appt next week.  She is requesting a refill of her lomotil which she has taken PRN for years.  Past Medical History:  Diagnosis Date  . Depression    Dr. Abner Greenspan  . Diverticulosis of colon   . Endometriosis   . Hx of colonic polyps   . Hyperlipidemia   . Hypothyroidism   . Manic, depressive ([place])   . Menopausal syndrome   . OSA (obstructive sleep apnea)    cpap; 17 cm (clint young)    Past Surgical History:  Procedure Laterality Date  . APPENDECTOMY    . COLONOSCOPY    . POLYPECTOMY     Dr. Ubaldo Glassing  . TONSILLECTOMY AND ADENOIDECTOMY      Social History   Socioeconomic History  . Marital status: Married    Spouse name: Not on file  . Number of children: Not on file  . Years of education: Not on file  . Highest education level: Not on file  Occupational History  . Occupation: retired  Scientific laboratory technician  . Financial resource strain: Not on file  . Food insecurity:    Worry: Not on file    Inability: Not on file  . Transportation needs:    Medical: Not on file    Non-medical: Not on file  Tobacco Use  . Smoking status: Former Smoker    Packs/day: 0.10    Years: 10.00    Pack years: 1.00    Types: Cigarettes    Last attempt to quit: 08/21/1983    Years since quitting: 35.1  . Smokeless tobacco: Never Used  . Tobacco comment: only smoked socially  Substance  and Sexual Activity  . Alcohol use: Yes    Alcohol/week: 1.0 - 2.0 standard drinks    Types: 1 - 2 Standard drinks or equivalent per week    Comment: occ glass of wine   . Drug use: No  . Sexual activity: Not on file  Lifestyle  . Physical activity:    Days per week: Not on file    Minutes per session: Not on file  . Stress: Not on file  Relationships  . Social connections:    Talks on phone: Not on file    Gets together: Not on file    Attends religious service: Not on file    Active member of club or organization: Not on file    Attends meetings of clubs or organizations: Not on file    Relationship status: Not on file  . Intimate partner violence:    Fear of current or ex partner: Not on file    Emotionally abused: Not on file    Physically abused: Not on file    Forced sexual activity: Not on file  Other Topics Concern  . Not on file  Social History Narrative  . Not on file    Family History  Problem Relation Age of Onset  . Diabetes Mother   . Heart disease Father        complications of coronary heart disease  . Diabetes Brother   . Heart disease Other   . Cancer Other        BREAST AND LUNG   . Breast cancer Maternal Aunt   . Cancer Maternal Grandfather        LUNG  . Breast cancer Daughter   . Colon cancer Neg Hx   . Rectal cancer Neg Hx   . Stomach cancer Neg Hx      Immunization History  Administered Date(s) Administered  . Influenza Split 09/20/2013  . Influenza, High Dose Seasonal PF 08/07/2016, 09/18/2017, 06/13/2018  . Pneumococcal Conjugate-13 09/11/2013  . Pneumococcal Polysaccharide-23 02/25/2009  . Td 02/25/2009  . Zoster 12/10/2012    Outpatient Encounter Medications as of 10/02/2018  Medication Sig  . atorvastatin (LIPITOR) 10 MG tablet TAKE 1 TABLET(10 MG) BY MOUTH DAILY  . Cholecalciferol (VITAMIN D3) 2000 units TABS Take 1 capsule by mouth daily.  . clonazePAM (KLONOPIN) 1 MG tablet Take 1 mg by mouth at bedtime.   .  diphenoxylate-atropine (LOMOTIL) 2.5-0.025 MG tablet TAKE 2 TABLET BY MOUTH FOUR TIMES DAILY AS NEEDED FOR DIARRHEA OR LOOSE STOOL  . divalproex (DEPAKOTE ER) 250 MG 24 hr tablet Take 1,250 mg by mouth every evening.  . eszopiclone (LUNESTA) 2 MG TABS Take 2 mg by mouth at bedtime as needed for sleep. Take immediately before bedtime  . furosemide (LASIX) 20 MG tablet Take 1 tablet (20 mg total) by mouth daily.  Marland Kitchen levothyroxine (SYNTHROID, LEVOTHROID) 112 MCG tablet TAKE 1 TABLET BY MOUTH DAILY  . Multiple Vitamins-Minerals (PRESERVISION AREDS 2) CAPS Take 1 capsule by mouth 2 (two) times daily.  . rivastigmine (EXELON) 13.3 MG/24HR APPLY 1 PATCH TO SKIN QAM  . TURMERIC CURCUMIN PO Take by mouth.  . Vitamin D, Ergocalciferol, (DRISDOL) 50000 units CAPS capsule TAKE 1 CAPSULE BY MOUTH EVERY 7 DAYS  . [DISCONTINUED] diphenoxylate-atropine (LOMOTIL) 2.5-0.025 MG tablet TAKE 2 TABLET BY MOUTH FOUR TIMES DAILY AS NEEDED FOR DIARRHEA OR LOOSE STOOL  . [DISCONTINUED] furosemide (LASIX) 20 MG tablet TAKE 1 TABLET(20 MG) BY MOUTH DAILY   No facility-administered encounter medications on file as of 10/02/2018.      ROS: Gen: no fever, chills  Skin: no rash, itching ENT: no ear pain, ear drainage, nasal congestion, rhinorrhea, sinus pressure, sore throat Eyes: no blurry vision, double vision Resp: no cough, wheeze,SOB CV: no CP, palpitations, LE edema,  GI: no heartburn, n/v/d/c, abd pain GU: no dysuria, urgency, frequency, hematuria  MSK: + joint pain, + myalgias, no back pain Neuro: no dizziness, headache, weakness Psych: + depression, minimal anxiety, + insomnia   No Known Allergies  BP 118/78   Pulse 83   Temp 97.9 F (36.6 C) (Oral)   Ht 5' 2.25" (1.581 m)   Wt 193 lb 6.4 oz (87.7 kg)   SpO2 96%   BMI 35.09 kg/m   Physical Exam  Constitutional: She is oriented to person, place, and time. She appears well-developed and well-nourished. No distress.  Neck: Neck supple.    Cardiovascular: Normal rate, regular rhythm and normal heart sounds.  Pulmonary/Chest: Effort normal and breath sounds normal. No respiratory distress.  Musculoskeletal:        General: Tenderness (generalized LE TTP, neg homans sign B/L) and edema (+  2 B/L LE edema) present.  Lymphadenopathy:    She has no cervical adenopathy.  Neurological: She is alert and oriented to person, place, and time.     A/P:  1. Acquired hypothyroidism - cont 161mcg levothyroxine - TSH - T4, free  2. Dyslipidemia - cont 10mg  lipitor - Lipid panel - Comprehensive metabolic panel  3. Vitamin D deficiency, unspecified - normal when checked in 04/2018  4. Bipolar 1 disorder, depressed ([place]) - cont current meds and regular f/u psych  5. B12 deficiency - pt taking oral Vit B12 supplement - Vitamin B12  6. Bilateral leg edema - furosemide (LASIX) 20 MG tablet; Take 1 tablet (20 mg total) by mouth daily.  Dispense: 90 tablet; Refill: 3 - pt to take daily x 1 week then stop  - f/u in 6 mo or sooner PRN

## 2018-10-03 ENCOUNTER — Ambulatory Visit (INDEPENDENT_AMBULATORY_CARE_PROVIDER_SITE_OTHER): Payer: Medicare Other | Admitting: Family Medicine

## 2018-10-03 ENCOUNTER — Encounter: Payer: Self-pay | Admitting: Family Medicine

## 2018-10-03 VITALS — BP 142/78 | HR 97 | Resp 18 | Ht 62.25 in | Wt 193.0 lb

## 2018-10-03 DIAGNOSIS — M1712 Unilateral primary osteoarthritis, left knee: Secondary | ICD-10-CM | POA: Diagnosis not present

## 2018-10-03 DIAGNOSIS — M1711 Unilateral primary osteoarthritis, right knee: Secondary | ICD-10-CM

## 2018-10-03 MED ORDER — DICLOFENAC SODIUM 2 % TD SOLN: 1.0000 "application " | Freq: Two times a day (BID) | TRANSDERMAL | 3 refills | Status: DC

## 2018-10-03 NOTE — Progress Notes (Signed)
Emily Castaneda - 84 y.o. female MRN [id number]  Date of birth: Aug 02, 1935  SUBJECTIVE:  Including CC & ROS.  No chief complaint on file.   Emily Castaneda is a 84 y.o. female that is presenting with acute on chronic bilateral knee pain.  The pain is occurring in the medial joint line.  She has pain with walking or getting up from a seated position.  She has received knee injections previously with some improvement.  Denies any inciting event.  Pain is intermittent.  It is also moderate to severe and localized to the knee.  Feels like the pain is getting worse.  Independent review of the left knee x-ray from 05/30/2018 shows degenerative changes of the medial joint line.   Review of Systems  Constitutional: Negative for fever.  HENT: Negative for congestion.   Respiratory: Negative for cough.   Cardiovascular: Negative for chest pain.  Gastrointestinal: Negative for abdominal pain.  Musculoskeletal: Positive for arthralgias and gait problem.  Skin: Negative for color change.  Neurological: Positive for weakness.  Hematological: Negative for adenopathy.  Psychiatric/Behavioral: Negative for agitation.    HISTORY: Past Medical, Surgical, Social, and Family History Reviewed & Updated per EMR.   Pertinent Historical Findings include:  Past Medical History:  Diagnosis Date  . Depression    Dr. Abner Greenspan  . Diverticulosis of colon   . Endometriosis   . Hx of colonic polyps   . Hyperlipidemia   . Hypothyroidism   . Manic, depressive ([place])   . Menopausal syndrome   . OSA (obstructive sleep apnea)    cpap; 17 cm (clint young)    Past Surgical History:  Procedure Laterality Date  . APPENDECTOMY    . COLONOSCOPY    . POLYPECTOMY     Dr. Ubaldo Glassing  . TONSILLECTOMY AND ADENOIDECTOMY      No Known Allergies  Family History  Problem Relation Age of Onset  . Diabetes Mother   . Heart disease Father        complications of coronary heart disease  . Diabetes Brother   . Heart disease  Other   . Cancer Other        BREAST AND LUNG   . Breast cancer Maternal Aunt   . Cancer Maternal Grandfather        LUNG  . Breast cancer Daughter   . Colon cancer Neg Hx   . Rectal cancer Neg Hx   . Stomach cancer Neg Hx      Social History   Socioeconomic History  . Marital status: Married    Spouse name: Not on file  . Number of children: Not on file  . Years of education: Not on file  . Highest education level: Not on file  Occupational History  . Occupation: retired  Scientific laboratory technician  . Financial resource strain: Not on file  . Food insecurity:    Worry: Not on file    Inability: Not on file  . Transportation needs:    Medical: Not on file    Non-medical: Not on file  Tobacco Use  . Smoking status: Former Smoker    Packs/day: 0.10    Years: 10.00    Pack years: 1.00    Types: Cigarettes    Last attempt to quit: 08/21/1983    Years since quitting: 35.1  . Smokeless tobacco: Never Used  . Tobacco comment: only smoked socially  Substance and Sexual Activity  . Alcohol use: Yes    Alcohol/week: 1.0 - 2.0  standard drinks    Types: 1 - 2 Standard drinks or equivalent per week    Comment: occ glass of wine   . Drug use: No  . Sexual activity: Not on file  Lifestyle  . Physical activity:    Days per week: Not on file    Minutes per session: Not on file  . Stress: Not on file  Relationships  . Social connections:    Talks on phone: Not on file    Gets together: Not on file    Attends religious service: Not on file    Active member of club or organization: Not on file    Attends meetings of clubs or organizations: Not on file    Relationship status: Not on file  . Intimate partner violence:    Fear of current or ex partner: Not on file    Emotionally abused: Not on file    Physically abused: Not on file    Forced sexual activity: Not on file  Other Topics Concern  . Not on file  Social History Narrative  . Not on file     PHYSICAL EXAM:  VS: BP (!)  142/78   Pulse 97   Resp 18   Ht 5' 2.25" (1.581 m)   Wt 193 lb (87.5 kg)   SpO2 98%   BMI 35.02 kg/m  Physical Exam Gen: NAD, alert, cooperative with exam,  ENT: normal lips, normal nasal mucosa,  Eye: normal EOM, normal conjunctiva and lids CV:  no edema, +2 pedal pulses   Resp: no accessory muscle use, non-labored,  GI: no masses or tenderness, no hernia  Skin: no rashes, no areas of induration  Neuro: normal tone, normal sensation to touch Psych:  normal insight, alert and oriented MSK:  Left and right knee: No obvious effusion. Tenderness palpation of the medial joint line. Normal range of motion. Normal strength resistance. Negative McMurray's test. Crepitus appreciated. Walking with a four-point walking cane. Neurovascular intact   Aspiration/Injection Procedure Note Emily Castaneda 09/25/1934  Procedure: Injection Indications: Left knee pain  Procedure Details Consent: Risks of procedure as well as the alternatives and risks of each were explained to the (patient/caregiver).  Consent for procedure obtained. Time Out: Verified patient identification, verified procedure, site/side was marked, verified correct patient position, special equipment/implants available, medications/allergies/relevent history reviewed, required imaging and test results available.  Performed.  The area was cleaned with iodine and alcohol swabs.    The left knee superior lateral suprapatellar pouch was injected using 1 cc's of 40 mg Kenalog and 4 cc's of 0.5% bupivacaine with a 22 1 1/2" needle.  Ultrasound was used. Images were obtained in long views showing the injection.     A sterile dressing was applied.  Patient did tolerate procedure well.    Aspiration/Injection Procedure Note Emily Castaneda 05/01/1935  Procedure: Injection Indications: Right knee pain  Procedure Details Consent: Risks of procedure as well as the alternatives and risks of each were explained to the  (patient/caregiver).  Consent for procedure obtained. Time Out: Verified patient identification, verified procedure, site/side was marked, verified correct patient position, special equipment/implants available, medications/allergies/relevent history reviewed, required imaging and test results available.  Performed.  The area was cleaned with iodine and alcohol swabs.    The right knee superiorlateral suprapatellar pouch was injected using 1 cc's of 40 mg kenalog and 4 cc's of 0.5% bupivacaine with a 22 1 1/2" needle.  Ultrasound was used. Images were obtained in long views  showing the injection.     A sterile dressing was applied.  Patient did tolerate procedure well.          ASSESSMENT & PLAN:   Degenerative arthritis of left knee Acute on chronic most likely related to degenerative changes  - injection today  - pennsaid  - counseled on supportive care - if no improvement consider gel injections.   Degenerative arthritis of right knee Acute on chronic and likely related to degenerative changes - injection  - pennsaid - counseled on supportive care - consider updated images  - consider gel injections if no improvement.

## 2018-10-03 NOTE — Patient Instructions (Addendum)
Nice to meet you  Please try the exercises  Please try ice on the knee  Please try the rub on medicine  Please see me back in if the steroid injection doesn't work and we can try gel injections.

## 2018-10-03 NOTE — Assessment & Plan Note (Signed)
Acute on chronic and likely related to degenerative changes - injection  - pennsaid - counseled on supportive care - consider updated images  - consider gel injections if no improvement.

## 2018-10-03 NOTE — Assessment & Plan Note (Signed)
Acute on chronic most likely related to degenerative changes  - injection today  - pennsaid  - counseled on supportive care - if no improvement consider gel injections.

## 2018-10-20 ENCOUNTER — Other Ambulatory Visit: Payer: Self-pay | Admitting: Family Medicine

## 2018-10-20 NOTE — Telephone Encounter (Signed)
Copied from [place] [id number]. Topic: Quick Communication - Rx Refill/Question >> Oct 20, 2018  1:55 PM Gustavus Messing wrote: Medication: levothyroxine (SYNTHROID, LEVOTHROID) 112 MCG tablet   Has the patient contacted their pharmacy? No. (Agent: If no, request that the patient contact the pharmacy for the refill.) Does not have any refills  [place], [address] AT [address] [phone] (Phone) [phone] (Fax)   Preferred Pharmacy (with phone number or street name):   Agent: Please be advised that RX refills may take up to 3 business days. We ask that you follow-up with your pharmacy.

## 2018-10-21 MED ORDER — LEVOTHYROXINE SODIUM 112 MCG PO TABS: 112.0000 ug | ORAL_TABLET | Freq: Every day | ORAL | 1 refills | Status: DC

## 2018-10-21 NOTE — Telephone Encounter (Signed)
Requested Prescriptions  Pending Prescriptions Disp Refills  . levothyroxine (SYNTHROID, LEVOTHROID) 112 MCG tablet 90 tablet 1    Sig: Take 1 tablet (112 mcg total) by mouth daily.     Endocrinology:  Hypothyroid Agents Failed - 10/20/2018  1:59 PM      Failed - TSH needs to be rechecked within 3 months after an abnormal result. Refill until TSH is due.      Passed - TSH in normal range and within 360 days    TSH  Date Value Ref Range Status  10/02/2018 4.13 0.35 - 4.50 uIU/mL Final         Passed - Valid encounter within last 12 months    Recent Outpatient Visits          2 weeks ago Primary osteoarthritis of left knee   [place], Enid Baas, MD   2 weeks ago Acquired hypothyroidism   [facility] [place], [place] K, DO   1 month ago BPPV (benign paroxysmal positional vertigo), bilateral   [facility] Castine, Anderson K, DO   3 months ago Encounter to establish care with new doctor   [facility] [place], DO   4 months ago Leg pain, left   [facility] [place], Malka So, MD      Future Appointments            In 5 months Holt, Garvin Fila, DO [facility] [place], [facility]

## 2019-01-14 ENCOUNTER — Ambulatory Visit (INDEPENDENT_AMBULATORY_CARE_PROVIDER_SITE_OTHER): Payer: Medicare Other | Admitting: Family Medicine

## 2019-01-14 ENCOUNTER — Encounter: Payer: Self-pay | Admitting: Family Medicine

## 2019-01-14 ENCOUNTER — Ambulatory Visit (INDEPENDENT_AMBULATORY_CARE_PROVIDER_SITE_OTHER): Payer: Medicare Other

## 2019-01-14 VITALS — BP 138/80 | HR 87 | Temp 98.2°F | Ht 62.25 in | Wt 179.0 lb

## 2019-01-14 DIAGNOSIS — M1711 Unilateral primary osteoarthritis, right knee: Secondary | ICD-10-CM

## 2019-01-14 DIAGNOSIS — M1712 Unilateral primary osteoarthritis, left knee: Secondary | ICD-10-CM | POA: Diagnosis not present

## 2019-01-14 NOTE — Progress Notes (Signed)
Emily Castaneda - 84 y.o. female MRN [id number]  Date of birth: July 17, 1935  SUBJECTIVE:  Including CC & ROS.  Chief Complaint  Patient presents with  . Injections    bilateral knee injections     Emily Castaneda is a 84 y.o. female that is presenting with acute on chronic worsening of her bilateral knee pain.  She denies any inciting event.  The pain is occurring over the medial joint line bilaterally.  The pain is worse with walking or going up and down stairs.  No improvement with home modalities.  She had improvement with previous steroid injections.  Denies any mechanical symptoms.  Pain is localized to the knee.  No redness.  Independent review of the left knee x-ray from 05/30/2018 shows degenerative changes of the medial joint line   Review of Systems  Constitutional: Negative for fever.  HENT: Negative for congestion.   Respiratory: Negative for cough.   Cardiovascular: Negative for chest pain.  Gastrointestinal: Negative for abdominal pain.  Musculoskeletal: Positive for arthralgias and back pain.  Skin: Negative for color change.  Neurological: Negative for weakness.  Hematological: Negative for adenopathy.    HISTORY: Past Medical, Surgical, Social, and Family History Reviewed & Updated per EMR.   Pertinent Historical Findings include:  Past Medical History:  Diagnosis Date  . Depression    Dr. Abner Greenspan  . Diverticulosis of colon   . Endometriosis   . Hx of colonic polyps   . Hyperlipidemia   . Hypothyroidism   . Manic, depressive ([place])   . Menopausal syndrome   . OSA (obstructive sleep apnea)    cpap; 17 cm (clint young)    Past Surgical History:  Procedure Laterality Date  . APPENDECTOMY    . COLONOSCOPY    . POLYPECTOMY     Dr. Ubaldo Glassing  . TONSILLECTOMY AND ADENOIDECTOMY      No Known Allergies  Family History  Problem Relation Age of Onset  . Diabetes Mother   . Heart disease Father        complications of coronary heart disease  . Diabetes Brother    . Heart disease Other   . Cancer Other        BREAST AND LUNG   . Breast cancer Maternal Aunt   . Cancer Maternal Grandfather        LUNG  . Breast cancer Daughter   . Colon cancer Neg Hx   . Rectal cancer Neg Hx   . Stomach cancer Neg Hx      Social History   Socioeconomic History  . Marital status: Married    Spouse name: Not on file  . Number of children: Not on file  . Years of education: Not on file  . Highest education level: Not on file  Occupational History  . Occupation: retired  Scientific laboratory technician  . Financial resource strain: Not on file  . Food insecurity:    Worry: Not on file    Inability: Not on file  . Transportation needs:    Medical: Not on file    Non-medical: Not on file  Tobacco Use  . Smoking status: Former Smoker    Packs/day: 0.10    Years: 10.00    Pack years: 1.00    Types: Cigarettes    Last attempt to quit: 08/21/1983    Years since quitting: 35.4  . Smokeless tobacco: Never Used  . Tobacco comment: only smoked socially  Substance and Sexual Activity  . Alcohol use: Yes  Alcohol/week: 1.0 - 2.0 standard drinks    Types: 1 - 2 Standard drinks or equivalent per week    Comment: occ glass of wine   . Drug use: No  . Sexual activity: Not on file  Lifestyle  . Physical activity:    Days per week: Not on file    Minutes per session: Not on file  . Stress: Not on file  Relationships  . Social connections:    Talks on phone: Not on file    Gets together: Not on file    Attends religious service: Not on file    Active member of club or organization: Not on file    Attends meetings of clubs or organizations: Not on file    Relationship status: Not on file  . Intimate partner violence:    Fear of current or ex partner: Not on file    Emotionally abused: Not on file    Physically abused: Not on file    Forced sexual activity: Not on file  Other Topics Concern  . Not on file  Social History Narrative  . Not on file     PHYSICAL  EXAM:  VS: BP 138/80   Pulse 87   Temp 98.2 F (36.8 C) (Oral)   Ht 5' 2.25" (1.581 m)   Wt 179 lb (81.2 kg)   SpO2 98%   BMI 32.48 kg/m  Physical Exam Gen: NAD, alert, cooperative with exam, well-appearing ENT: normal lips, normal nasal mucosa,  Eye: normal EOM, normal conjunctiva and lids CV:  no edema, +2 pedal pulses   Resp: no accessory muscle use, non-labored,   Skin: no rashes, no areas of induration  Neuro: normal tone, normal sensation to touch Psych:  normal insight, alert and oriented MSK:  Right and left Knee: Normal to inspection with no erythema or effusion or obvious bony abnormalities. Palpation normal with no warmth, joint line tenderness, patellar tenderness, or condyle tenderness. ROM full in flexion and extension and lower leg rotation. Mild instability with valgus and varus stress testing  Negative Mcmurray's  tests. Non painful patellar compression. Patellar glide without crepitus. Patellar and quadriceps tendons unremarkable. Hamstring and quadriceps strength is normal.  Neurovascularly intact    Aspiration/Injection Procedure Note PORSHE FLEAGLE Apr 11, 1935  Procedure: Injection Indications: Right knee pain  Procedure Details Consent: Risks of procedure as well as the alternatives and risks of each were explained to the (patient/caregiver).  Consent for procedure obtained. Time Out: Verified patient identification, verified procedure, site/side was marked, verified correct patient position, special equipment/implants available, medications/allergies/relevent history reviewed, required imaging and test results available.  Performed.  The area was cleaned with iodine and alcohol swabs.    The right knee superior lateral suprapatellar pouch was injected using 1 cc's of 40 mg Kenalog and 4 cc's of 0.25% bupivacaine with a 22 1 1/2" needle.  Ultrasound was used. Images were obtained in long views showing the injection.     A sterile dressing was  applied.  Patient did tolerate procedure well.   Aspiration/Injection Procedure Note LATONDA LARRIVEE [birth year]  Procedure: Injection Indications: Left knee pain  Procedure Details Consent: Risks of procedure as well as the alternatives and risks of each were explained to the (patient/caregiver).  Consent for procedure obtained. Time Out: Verified patient identification, verified procedure, site/side was marked, verified correct patient position, special equipment/implants available, medications/allergies/relevent history reviewed, required imaging and test results available.  Performed.  The area was cleaned with iodine and alcohol swabs.  The left knee superior lateral suprapatellar pouch was injected using 1 cc's of 40 mg Kenalog and 4 cc's of 0.25% bupivacaine with a 22 1 1/2" needle.  Ultrasound was used. Images were obtained in long views showing the injection.     A sterile dressing was applied.  Patient did tolerate procedure well.     ASSESSMENT & PLAN:   Degenerative arthritis of left knee Acute on chronic pain that is recently worsening.  Likely related generative changes. -Injection. -Counseled on home exercise therapy and supportive care. -Counseled and discussed gel injections  Degenerative arthritis of right knee Acute on chronic pain that is recently worsening.  Likely related generative changes. -Injection. -Counseled on home exercise therapy and supportive care. -Counseled and discussed gel injections

## 2019-01-14 NOTE — Patient Instructions (Signed)
Good to see you Please try voltaren gel  Take tylenol 650 mg three times a day is the best evidence based medicine we have for arthritis.  Glucosamine sulfate 750mg  twice a day is a supplement that has been shown to help moderate to severe arthritis. Vitamin D 2000 IU daily Fish oil 2 grams daily.  Tumeric 500mg  twice daily.  Capsaicin topically up to four times a day may also help with pain.    Please send me a message in MyChart with any questions or updates.  Please let me know if you want to consider the gel injections.   --Dr. Raeford Razor

## 2019-01-15 ENCOUNTER — Other Ambulatory Visit: Payer: Self-pay | Admitting: Family Medicine

## 2019-01-15 NOTE — Assessment & Plan Note (Signed)
Acute on chronic pain that is recently worsening.  Likely related generative changes. -Injection. -Counseled on home exercise therapy and supportive care. -Counseled and discussed gel injections

## 2019-01-19 DIAGNOSIS — H02834 Dermatochalasis of left upper eyelid: Secondary | ICD-10-CM | POA: Diagnosis not present

## 2019-01-19 DIAGNOSIS — H02831 Dermatochalasis of right upper eyelid: Secondary | ICD-10-CM | POA: Diagnosis not present

## 2019-01-19 DIAGNOSIS — H353132 Nonexudative age-related macular degeneration, bilateral, intermediate dry stage: Secondary | ICD-10-CM | POA: Diagnosis not present

## 2019-03-19 DIAGNOSIS — G4730 Sleep apnea, unspecified: Secondary | ICD-10-CM | POA: Diagnosis not present

## 2019-03-19 DIAGNOSIS — G4733 Obstructive sleep apnea (adult) (pediatric): Secondary | ICD-10-CM | POA: Diagnosis not present

## 2019-03-20 ENCOUNTER — Encounter: Payer: Self-pay | Admitting: Family Medicine

## 2019-03-20 ENCOUNTER — Ambulatory Visit (INDEPENDENT_AMBULATORY_CARE_PROVIDER_SITE_OTHER): Payer: Medicare Other | Admitting: Family Medicine

## 2019-03-20 VITALS — BP 120/70 | HR 115 | Ht 62.25 in | Wt 193.4 lb

## 2019-03-20 DIAGNOSIS — N39498 Other specified urinary incontinence: Secondary | ICD-10-CM | POA: Diagnosis not present

## 2019-03-20 DIAGNOSIS — R32 Unspecified urinary incontinence: Secondary | ICD-10-CM | POA: Insufficient documentation

## 2019-03-20 DIAGNOSIS — T887XXA Unspecified adverse effect of drug or medicament, initial encounter: Secondary | ICD-10-CM | POA: Diagnosis not present

## 2019-03-20 DIAGNOSIS — R600 Localized edema: Secondary | ICD-10-CM

## 2019-03-20 NOTE — Progress Notes (Signed)
Established Patient Office Visit  Subjective:  Patient ID: Emily Castaneda, female    DOB: 1934/10/01  Age: 84 y.o. MRN: [id number]  CC:  Chief Complaint  Patient presents with  . possible bladder infection    HPI Emily Castaneda presents for evaluation of a weeklong history of spontaneous urinary incontinence.  Not associated with cough or sneeze.  Denies fevers chills frequency urgency or burning.  2 daughters had come to visit with her prior to onset.  She had been given Lasix in the past for lower extremity edema to use as needed.  She will eat recently restarted this about a week ago.  Admits to high sodium intake in her diet recently.  Denies shortness of breath chest pain orthopnea or history of CHF.  Past Medical History:  Diagnosis Date  . Depression    Dr. Abner Greenspan  . Diverticulosis of colon   . Endometriosis   . Hx of colonic polyps   . Hyperlipidemia   . Hypothyroidism   . Manic, depressive ([place])   . Menopausal syndrome   . OSA (obstructive sleep apnea)    cpap; 17 cm (clint young)    Past Surgical History:  Procedure Laterality Date  . APPENDECTOMY    . COLONOSCOPY    . POLYPECTOMY     Dr. Ubaldo Glassing  . TONSILLECTOMY AND ADENOIDECTOMY      Family History  Problem Relation Age of Onset  . Diabetes Mother   . Heart disease Father        complications of coronary heart disease  . Diabetes Brother   . Heart disease Other   . Cancer Other        BREAST AND LUNG   . Breast cancer Maternal Aunt   . Cancer Maternal Grandfather        LUNG  . Breast cancer Daughter   . Colon cancer Neg Hx   . Rectal cancer Neg Hx   . Stomach cancer Neg Hx     Social History   Socioeconomic History  . Marital status: Married    Spouse name: Not on file  . Number of children: Not on file  . Years of education: Not on file  . Highest education level: Not on file  Occupational History  . Occupation: retired  Scientific laboratory technician  . Financial resource strain: Not on file  . Food  insecurity    Worry: Not on file    Inability: Not on file  . Transportation needs    Medical: Not on file    Non-medical: Not on file  Tobacco Use  . Smoking status: Former Smoker    Packs/day: 0.10    Years: 10.00    Pack years: 1.00    Types: Cigarettes    Quit date: 08/21/1983    Years since quitting: 35.6  . Smokeless tobacco: Never Used  . Tobacco comment: only smoked socially  Substance and Sexual Activity  . Alcohol use: Yes    Alcohol/week: 1.0 - 2.0 standard drinks    Types: 1 - 2 Standard drinks or equivalent per week    Comment: occ glass of wine   . Drug use: No  . Sexual activity: Not on file  Lifestyle  . Physical activity    Days per week: Not on file    Minutes per session: Not on file  . Stress: Not on file  Relationships  . Social Herbalist on phone: Not on file    Gets  together: Not on file    Attends religious service: Not on file    Active member of club or organization: Not on file    Attends meetings of clubs or organizations: Not on file    Relationship status: Not on file  . Intimate partner violence    Fear of current or ex partner: Not on file    Emotionally abused: Not on file    Physically abused: Not on file    Forced sexual activity: Not on file  Other Topics Concern  . Not on file  Social History Narrative  . Not on file    Outpatient Medications Prior to Visit  Medication Sig Dispense Refill  . atorvastatin (LIPITOR) 10 MG tablet TAKE 1 TABLET(10 MG) BY MOUTH DAILY 90 tablet 0  . Cholecalciferol (VITAMIN D3) 2000 units TABS Take 1 capsule by mouth daily.    . clonazePAM (KLONOPIN) 1 MG tablet Take 1 mg by mouth at bedtime.     . Diclofenac Sodium (PENNSAID) 2 % SOLN Place 1 application onto the skin 2 (two) times daily. 1 Bottle 3  . diphenoxylate-atropine (LOMOTIL) 2.5-0.025 MG tablet TAKE 2 TABLET BY MOUTH FOUR TIMES DAILY AS NEEDED FOR DIARRHEA OR LOOSE STOOL 120 tablet 0  . divalproex (DEPAKOTE ER) 250 MG 24 hr  tablet Take 1,250 mg by mouth every evening.    . eszopiclone (LUNESTA) 2 MG TABS Take 2 mg by mouth at bedtime as needed for sleep. Take immediately before bedtime    . furosemide (LASIX) 20 MG tablet Take 1 tablet (20 mg total) by mouth daily. 90 tablet 3  . levothyroxine (SYNTHROID, LEVOTHROID) 112 MCG tablet Take 1 tablet (112 mcg total) by mouth daily. 90 tablet 1  . Multiple Vitamins-Minerals (PRESERVISION AREDS 2) CAPS Take 1 capsule by mouth 2 (two) times daily.    . rivastigmine (EXELON) 13.3 MG/24HR APPLY 1 PATCH TO SKIN QAM    . TURMERIC CURCUMIN PO Take by mouth.    . Vitamin D, Ergocalciferol, (DRISDOL) 50000 units CAPS capsule TAKE 1 CAPSULE BY MOUTH EVERY 7 DAYS 12 capsule 0   No facility-administered medications prior to visit.     No Known Allergies  ROS Review of Systems  Constitutional: Negative.   Respiratory: Negative for chest tightness, shortness of breath and wheezing.   Cardiovascular: Positive for leg swelling. Negative for chest pain and palpitations.  Gastrointestinal: Negative.   Genitourinary: Negative for difficulty urinating, dysuria, frequency and hematuria.  Skin: Negative for color change and pallor.  Neurological: Negative for weakness and numbness.  Hematological: Negative.       Objective:    Physical Exam  Constitutional: She is oriented to person, place, and time. She appears well-developed and well-nourished. No distress.  HENT:  Head: Normocephalic and atraumatic.  Right Ear: External ear normal.  Left Ear: External ear normal.  Eyes: Pupils are equal, round, and reactive to light. Conjunctivae are normal. Right eye exhibits no discharge. Left eye exhibits no discharge. No scleral icterus.  Neck: No JVD present. No tracheal deviation present.  Cardiovascular: Normal rate, regular rhythm and normal heart sounds.  Pulmonary/Chest: Effort normal and breath sounds normal. No stridor.  Musculoskeletal:        General: Edema present.   Neurological: She is alert and oriented to person, place, and time.  Skin: Skin is warm and dry. She is not diaphoretic.  Psychiatric: She has a normal mood and affect. Her behavior is normal.    BP 120/70  Pulse (!) 115   Ht 5' 2.25" (1.581 m)   Wt 193 lb 6 oz (87.7 kg)   SpO2 97%   BMI 35.09 kg/m  Wt Readings from Last 3 Encounters:  03/20/19 193 lb 6 oz (87.7 kg)  01/14/19 179 lb (81.2 kg)  10/03/18 193 lb (87.5 kg)   BP Readings from Last 3 Encounters:  03/20/19 120/70  01/14/19 138/80  10/03/18 (!) 142/78   Guideline developer:  UpToDate (see UpToDate for funding source) Date Released: June 2014  Health Maintenance Due  Topic Date Due  . MAMMOGRAM  06/27/2018  . TETANUS/TDAP  02/26/2019    There are no preventive care reminders to display for this patient.  Lab Results  Component Value Date   TSH 4.13 10/02/2018   Lab Results  Component Value Date   WBC 10.6 (H) 10/09/2017   HGB 13.1 10/09/2017   HCT 38.7 10/09/2017   MCV 96.8 10/09/2017   PLT 182.0 10/09/2017   Lab Results  Component Value Date   NA 143 10/02/2018   K 4.0 10/02/2018   CO2 29 10/02/2018   GLUCOSE 96 10/02/2018   BUN 20 10/02/2018   CREATININE 0.70 10/02/2018   BILITOT 0.4 10/02/2018   ALKPHOS 43 10/02/2018   AST 17 10/02/2018   ALT 15 10/02/2018   PROT 6.1 10/02/2018   ALBUMIN 3.8 10/02/2018   CALCIUM 9.2 10/02/2018   GFR 80.17 10/02/2018   Lab Results  Component Value Date   CHOL 186 10/02/2018   Lab Results  Component Value Date   HDL 44.10 10/02/2018   Lab Results  Component Value Date   LDLCALC 92 10/28/2014   Lab Results  Component Value Date   TRIG 233.0 (H) 10/02/2018   Lab Results  Component Value Date   CHOLHDL 4 10/02/2018   No results found for: HGBA1C    Assessment & Plan:   Problem List Items Addressed This Visit      Other   Edema leg   Absence of bladder continence   Relevant Orders   Urinalysis, Routine w reflex microscopic    Medication side effect - Primary      No orders of the defined types were placed in this encounter.   Follow-up: No follow-ups on file.   Patient will discontinue the Lasix and try compression stockings.  She will lower the sodium in her diet follow-up with Dr. Bryan Lemma next week.

## 2019-03-23 ENCOUNTER — Telehealth: Payer: Self-pay

## 2019-03-23 ENCOUNTER — Ambulatory Visit: Payer: Self-pay | Admitting: Family Medicine

## 2019-03-23 ENCOUNTER — Ambulatory Visit (INDEPENDENT_AMBULATORY_CARE_PROVIDER_SITE_OTHER): Payer: Medicare Other | Admitting: Family Medicine

## 2019-03-23 ENCOUNTER — Encounter: Payer: Self-pay | Admitting: Family Medicine

## 2019-03-23 VITALS — BP 130/78 | HR 86 | Temp 97.8°F | Ht 62.25 in | Wt 192.2 lb

## 2019-03-23 DIAGNOSIS — R531 Weakness: Secondary | ICD-10-CM

## 2019-03-23 DIAGNOSIS — R600 Localized edema: Secondary | ICD-10-CM

## 2019-03-23 DIAGNOSIS — R2681 Unsteadiness on feet: Secondary | ICD-10-CM | POA: Diagnosis not present

## 2019-03-23 DIAGNOSIS — N39498 Other specified urinary incontinence: Secondary | ICD-10-CM | POA: Diagnosis not present

## 2019-03-23 DIAGNOSIS — R32 Unspecified urinary incontinence: Secondary | ICD-10-CM

## 2019-03-23 DIAGNOSIS — R2689 Other abnormalities of gait and mobility: Secondary | ICD-10-CM

## 2019-03-23 DIAGNOSIS — I872 Venous insufficiency (chronic) (peripheral): Secondary | ICD-10-CM

## 2019-03-23 LAB — POCT URINALYSIS DIPSTICK
Bilirubin, UA: NEGATIVE
Blood, UA: NEGATIVE
Glucose, UA: NEGATIVE
Ketones, UA: 2
Nitrite, UA: NEGATIVE
Protein, UA: POSITIVE — AB
Spec Grav, UA: 1.015 (ref 1.010–1.025)
Urobilinogen, UA: 0.2 E.U./dL
pH, UA: 6.5 (ref 5.0–8.0)

## 2019-03-23 LAB — URINALYSIS, ROUTINE W REFLEX MICROSCOPIC
Bilirubin Urine: NEGATIVE
Hgb urine dipstick: NEGATIVE
Ketones, ur: 15 — AB
Nitrite: NEGATIVE
RBC / HPF: NONE SEEN (ref 0–?)
Specific Gravity, Urine: 1.02 (ref 1.000–1.030)
Total Protein, Urine: NEGATIVE
Urine Glucose: NEGATIVE
Urobilinogen, UA: 0.2 (ref 0.0–1.0)
pH: 7 (ref 5.0–8.0)

## 2019-03-23 NOTE — Telephone Encounter (Signed)
Dr. Loletha Grayer please advise  Copied from [place] [id number]. Topic: Referral - Request for Referral >> Mar 23, 2019  8:43 AM Nils Flack wrote: Has patient seen PCP for this complaint? Yes.   *If NO, is insurance requiring patient see PCP for this issue before PCP can refer them? Referral for which specialty: physical therapy - home health Preferred provider/office:  Reason for referral: balance, coordination  Cb is [phone]

## 2019-03-23 NOTE — Telephone Encounter (Signed)
Pt's daughter 'Emily Castaneda' calling, on DPR. States pt seen at practice Friday for UTI symptoms, urinary incontinence. States advised to stop Lasix.  Daughter states increased swelling both legs, "Tight" from ankles to knees, feet swollen as well. States painful to walk and "Blotchy discoloration." Not weeping. Denies SOB, O2 sats "Good." Also reports pt was not able to provide urine specimen at Fridays visit. Pt has appt with Dr. Bryan Lemma 04/02/2019. Call transferred to practice, Liane Comber for consideration of earlier appt..  Reason for Disposition . [1] MODERATE leg swelling (e.g., swelling extends up to knees) AND [2] new onset or worsening  Answer Assessment - Initial Assessment Questions 1. ONSET: "When did the swelling start?" (e.g., minutes, hours, days)     2-3 weeks ago 2. LOCATION: "What part of the leg is swollen?"  "Are both legs swollen or just one leg?"     Both legs, from feet to nees 3. SEVERITY: "How bad is the swelling?" (e.g., localized; mild, moderate, severe)  - Localized - small area of swelling localized to one leg  - MILD pedal edema - swelling limited to foot and ankle, pitting edema < 1/4 inch (6 mm) deep, rest and elevation eliminate most or all swelling  - MODERATE edema - swelling of lower leg to knee, pitting edema > 1/4 inch (6 mm) deep, rest and elevation only partially reduce swelling  - SEVERE edema - swelling extends above knee, facial or hand swelling present      Moderate 4. REDNESS: "Does the swelling look red or infected?"    "Some blotchy discoloration" 5. PAIN: "Is the swelling painful to touch?" If so, ask: "How painful is it?"   (Scale 1-10; mild, moderate or severe)     Moderate with walking 6. FEVER: "Do you have a fever?" If so, ask: "What is it, how was it measured, and when did it start?"      no 7. CAUSE: "What do you think is causing the leg swelling?"      8. MEDICAL HISTORY: "Do you have a history of heart failure, kidney disease, liver failure, or  cancer?"     9. RECURRENT SYMPTOM: "Have you had leg swelling before?" If so, ask: "When was the last time?" "What happened that time?"    H/O  10. OTHER SYMPTOMS: "Do you have any other symptoms?" (e.g., chest pain, difficulty breathing)       Seen at practice Friday  Protocols used: LEG SWELLING AND EDEMA-A-AH

## 2019-03-23 NOTE — Patient Instructions (Signed)

## 2019-03-23 NOTE — Progress Notes (Signed)
Please send urine for culture. Thanks!

## 2019-03-24 NOTE — Telephone Encounter (Signed)
Referral placed.

## 2019-03-25 LAB — URINE CULTURE
MICRO NUMBER:: 731575
Result:: NO GROWTH
SPECIMEN QUALITY:: ADEQUATE

## 2019-03-26 ENCOUNTER — Encounter: Payer: Self-pay | Admitting: Family Medicine

## 2019-03-26 DIAGNOSIS — I872 Venous insufficiency (chronic) (peripheral): Secondary | ICD-10-CM | POA: Insufficient documentation

## 2019-03-26 NOTE — Progress Notes (Signed)
Emily Castaneda - 84 y.o. female MRN [id number]  Date of birth: [birth year]  Subjective Chief Complaint  Patient presents with  . Edema    Swelling both(mainlyleft ankle) legs/hard to walk/ advised 7/31 to stop lasix seen for UTI/havinghard time getting herself up(4 weeks ago fell)    HPI Emily Castaneda is a 84 y.o. female with history of hypothyroidism, bipolar d/o, and hyperlipidemia here to follow up LE edema.  She was seen just last week for similar issue.  Also having some urinary frequency/incontinence issues.  Her lasix was held at that time and she was instructed to try compression stockings.  Since she has stopped her lasix she has had increased swelling to the point where it is painful for her to walk. She feels unstable with walking due to this and tends to sit around a large portion of the day with her legs hanging down instead of elevated.  Had improvement with HH PT in the past for her gait instability and would like to try this again if possible.  She denies chest pain or tightness, dyspnea or orthopnea.   ROS:  A comprehensive ROS was completed and negative except as noted per HPI  No Known Allergies  Past Medical History:  Diagnosis Date  . Depression    Dr. Abner Greenspan  . Diverticulosis of colon   . Endometriosis   . Hx of colonic polyps   . Hyperlipidemia   . Hypothyroidism   . Manic, depressive ([place])   . Menopausal syndrome   . OSA (obstructive sleep apnea)    cpap; 17 cm (clint young)    Past Surgical History:  Procedure Laterality Date  . APPENDECTOMY    . COLONOSCOPY    . POLYPECTOMY     Dr. Ubaldo Glassing  . TONSILLECTOMY AND ADENOIDECTOMY      Social History   Socioeconomic History  . Marital status: Married    Spouse name: Not on file  . Number of children: Not on file  . Years of education: Not on file  . Highest education level: Not on file  Occupational History  . Occupation: retired  Scientific laboratory technician  . Financial resource strain: Not on file  . Food  insecurity    Worry: Not on file    Inability: Not on file  . Transportation needs    Medical: Not on file    Non-medical: Not on file  Tobacco Use  . Smoking status: Former Smoker    Packs/day: 0.10    Years: 10.00    Pack years: 1.00    Types: Cigarettes    Quit date: 08/21/1983    Years since quitting: 35.6  . Smokeless tobacco: Never Used  . Tobacco comment: only smoked socially  Substance and Sexual Activity  . Alcohol use: Yes    Alcohol/week: 1.0 - 2.0 standard drinks    Types: 1 - 2 Standard drinks or equivalent per week    Comment: occ glass of wine   . Drug use: No  . Sexual activity: Not on file  Lifestyle  . Physical activity    Days per week: Not on file    Minutes per session: Not on file  . Stress: Not on file  Relationships  . Social Herbalist on phone: Not on file    Gets together: Not on file    Attends religious service: Not on file    Active member of club or organization: Not on file    Attends meetings  of clubs or organizations: Not on file    Relationship status: Not on file  Other Topics Concern  . Not on file  Social History Narrative  . Not on file    Family History  Problem Relation Age of Onset  . Diabetes Mother   . Heart disease Father        complications of coronary heart disease  . Diabetes Brother   . Heart disease Other   . Cancer Other        BREAST AND LUNG   . Breast cancer Maternal Aunt   . Cancer Maternal Grandfather        LUNG  . Breast cancer Daughter   . Colon cancer Neg Hx   . Rectal cancer Neg Hx   . Stomach cancer Neg Hx     Health Maintenance  Topic Date Due  . MAMMOGRAM  06/27/2018  . TETANUS/TDAP  02/26/2019  . INFLUENZA VACCINE  03/21/2019  . DEXA SCAN  Completed  . PNA vac Low Risk Adult  Completed  . COLONOSCOPY  Discontinued     ----------------------------------------------------------------------------------------------------------------------------------------------------------------------------------------------------------------- Physical Exam BP 130/78   Pulse 86   Temp 97.8 F (36.6 C) (Oral)   Ht 5' 2.25" (1.581 m)   Wt 192 lb 3.2 oz (87.2 kg)   SpO2 97%   BMI 34.87 kg/m   Physical Exam Constitutional:      Appearance: Normal appearance.  HENT:     Head: Normocephalic and atraumatic.  Eyes:     General: No scleral icterus. Neck:     Musculoskeletal: Neck supple.  Cardiovascular:     Rate and Rhythm: Normal rate and regular rhythm.     Heart sounds: Normal heart sounds.  Pulmonary:     Effort: Pulmonary effort is normal.     Breath sounds: Normal breath sounds.  Musculoskeletal:     Right lower leg: Edema (2+) present.     Left lower leg: Edema (2+) present.  Skin:    General: Skin is warm and dry.     Findings: No rash.  Neurological:     General: No focal deficit present.     Mental Status: She is alert and oriented to person, place, and time.  Psychiatric:        Mood and Affect: Mood normal.        Behavior: Behavior normal.     ------------------------------------------------------------------------------------------------------------------------------------------------------------------------------------------------------------------- Assessment and Plan  Venous insufficiency -Will hold off on restarting lasix given her incontinence issues.  Check UA today.  -Recommend she keep legs elevated as much as possible.  -They are planning on picking up compression stockings. -Orders entered for [facility] PT as well for generalized weakness and gait instability as I think moving more will help mobilize fluid.   >25 minutes spent with patient with 50% of time spent providing counseling and/or coordination of care as outlined above.

## 2019-03-26 NOTE — Assessment & Plan Note (Signed)
-  Will hold off on restarting lasix given her incontinence issues.  Check UA today.  -Recommend she keep legs elevated as much as possible.  -They are planning on picking up compression stockings. -Orders entered for [facility] PT as well for generalized weakness and gait instability as I think moving more will help mobilize fluid.

## 2019-03-27 NOTE — Progress Notes (Signed)
Urine culture without growth.  No antibiotics indicated at this time.

## 2019-04-01 ENCOUNTER — Telehealth: Payer: Self-pay

## 2019-04-01 NOTE — Telephone Encounter (Signed)
Questions for Screening COVID-19  Symptom onset: n/a  Travel or Contacts: no  During this illness, did/does the patient experience any of the following symptoms? Fever >100.85F []   Yes [x]   No []   Unknown Subjective fever (felt feverish) []   Yes [x]   No []   Unknown Chills []   Yes [x]   No []   Unknown Muscle aches (myalgia) []   Yes [x]   No []   Unknown Runny nose (rhinorrhea) []   Yes [x]   No []   Unknown Sore throat []   Yes [x]   No []   Unknown Cough (new onset or worsening of chronic cough) []   Yes [x]   No []   Unknown Shortness of breath (dyspnea) []   Yes [x]   No []   Unknown Nausea or vomiting []   Yes [x]   No []   Unknown Headache []   Yes [x]   No []   Unknown Abdominal pain  []   Yes [x]   No []   Unknown Diarrhea (?3 loose/looser than normal stools/24hr period) []   Yes [x]   No []   Unknown Other, specify:  Patient risk factors: Smoker? []   Current []   Former []   Never If female, currently pregnant? []   Yes []   No  Patient Active Problem List   Diagnosis Date Noted  . Venous insufficiency 03/26/2019  . Edema leg 03/20/2019  . Absence of bladder continence 03/20/2019  . Medication side effect 03/20/2019  . Unstable gait 05/19/2018  . Vitamin D deficiency, unspecified 05/19/2018  . Degenerative arthritis of right knee 10/07/2017  . Morbid obesity ([place]) 06/03/2017  . Degenerative arthritis of left knee 12/19/2016  . Polymyalgia ([place]) 12/19/2016  . Acute bronchiolitis 11/14/2013  . VULVAR ABSCESS 02/28/2010  . Depression 02/26/2008  . Hypothyroidism 02/26/2007  . Dyslipidemia 02/26/2007  . Bipolar 1 disorder, depressed ([place]) 02/26/2007  . Diverticulosis of large intestine 02/26/2007  . MENOPAUSAL SYNDROME 02/26/2007  . History of colonic polyps 02/26/2007    Plan:  []   High risk for COVID-19 with red flags go to ED (with CP, SOB, weak/lightheaded, or fever > 101.5). Call ahead.  []   High risk for COVID-19 but stable. Inform provider and coordinate time for [facility] visit.   []   No  red flags but URI signs or symptoms okay for [facility] visit.

## 2019-04-02 ENCOUNTER — Ambulatory Visit (INDEPENDENT_AMBULATORY_CARE_PROVIDER_SITE_OTHER): Payer: Medicare Other | Admitting: Family Medicine

## 2019-04-02 ENCOUNTER — Encounter: Payer: Self-pay | Admitting: Family Medicine

## 2019-04-02 VITALS — BP 122/70 | HR 82 | Temp 98.1°F | Ht 62.25 in | Wt 194.6 lb

## 2019-04-02 DIAGNOSIS — R531 Weakness: Secondary | ICD-10-CM | POA: Diagnosis not present

## 2019-04-02 DIAGNOSIS — R29898 Other symptoms and signs involving the musculoskeletal system: Secondary | ICD-10-CM

## 2019-04-02 DIAGNOSIS — I872 Venous insufficiency (chronic) (peripheral): Secondary | ICD-10-CM | POA: Diagnosis not present

## 2019-04-02 NOTE — Progress Notes (Signed)
Emily Castaneda is a 84 y.o. female  Chief Complaint  Patient presents with  . Follow-up    6 mo/ leg swelling went down/ trouble getting up and down out chair    HPI: Emily Castaneda is a 84 y.o. female here with her husband to f/u on 03/23/19 office visit with my colleague Dr. Zigmund Daniel for LE edema and concern for UTI. Her lasix had been stopped at appt on 03/20/19 with Dr. Ethelene Hal. At 03/23/19 appt, UA and culture was done and pt was recommended to use compression stockings, keep legs elevated, and HH PT referral was placed. As per husband, PT has not contacted them. Pt ambulates with a 4 point cane. No falls. Pt has a hard time transferring from chair to standing. Swelling has improved significantly. She urinates frequently but denies urgency or dysuria or gross hematuria. She has been keeping her legs elevated and tried compression stockings but had difficulty getting them off and ended up cutting them off. She denies CP, SOB.    Past Medical History:  Diagnosis Date  . Depression    Dr. Abner Greenspan  . Diverticulosis of colon   . Endometriosis   . Hx of colonic polyps   . Hyperlipidemia   . Hypothyroidism   . Manic, depressive ([place])   . Menopausal syndrome   . OSA (obstructive sleep apnea)    cpap; 17 cm (clint young)    Past Surgical History:  Procedure Laterality Date  . APPENDECTOMY    . COLONOSCOPY    . POLYPECTOMY     Dr. Ubaldo Glassing  . TONSILLECTOMY AND ADENOIDECTOMY      Social History   Socioeconomic History  . Marital status: Married    Spouse name: Not on file  . Number of children: Not on file  . Years of education: Not on file  . Highest education level: Not on file  Occupational History  . Occupation: retired  Scientific laboratory technician  . Financial resource strain: Not on file  . Food insecurity    Worry: Not on file    Inability: Not on file  . Transportation needs    Medical: Not on file    Non-medical: Not on file  Tobacco Use  . Smoking status: Former Smoker   Packs/day: 0.10    Years: 10.00    Pack years: 1.00    Types: Cigarettes    Quit date: 08/21/1983    Years since quitting: 35.6  . Smokeless tobacco: Never Used  . Tobacco comment: only smoked socially  Substance and Sexual Activity  . Alcohol use: Yes    Alcohol/week: 1.0 - 2.0 standard drinks    Types: 1 - 2 Standard drinks or equivalent per week    Comment: occ glass of wine   . Drug use: No  . Sexual activity: Not on file  Lifestyle  . Physical activity    Days per week: Not on file    Minutes per session: Not on file  . Stress: Not on file  Relationships  . Social Herbalist on phone: Not on file    Gets together: Not on file    Attends religious service: Not on file    Active member of club or organization: Not on file    Attends meetings of clubs or organizations: Not on file    Relationship status: Not on file  . Intimate partner violence    Fear of current or ex partner: Not on file  Emotionally abused: Not on file    Physically abused: Not on file    Forced sexual activity: Not on file  Other Topics Concern  . Not on file  Social History Narrative  . Not on file    Family History  Problem Relation Age of Onset  . Diabetes Mother   . Heart disease Father        complications of coronary heart disease  . Diabetes Brother   . Heart disease Other   . Cancer Other        BREAST AND LUNG   . Breast cancer Maternal Aunt   . Cancer Maternal Grandfather        LUNG  . Breast cancer Daughter   . Colon cancer Neg Hx   . Rectal cancer Neg Hx   . Stomach cancer Neg Hx      Immunization History  Administered Date(s) Administered  . Influenza Split 09/20/2013  . Influenza, High Dose Seasonal PF 08/07/2016, 09/18/2017, 06/13/2018  . Pneumococcal Conjugate-13 09/11/2013  . Pneumococcal Polysaccharide-23 02/25/2009  . Td 02/25/2009  . Zoster 12/10/2012    Outpatient Encounter Medications as of 04/02/2019  Medication Sig  . atorvastatin (LIPITOR)  10 MG tablet TAKE 1 TABLET(10 MG) BY MOUTH DAILY  . Cholecalciferol (VITAMIN D3) 2000 units TABS Take 1 capsule by mouth daily.  . clonazePAM (KLONOPIN) 1 MG tablet Take 1 mg by mouth at bedtime.   . diphenoxylate-atropine (LOMOTIL) 2.5-0.025 MG tablet TAKE 2 TABLET BY MOUTH FOUR TIMES DAILY AS NEEDED FOR DIARRHEA OR LOOSE STOOL  . divalproex (DEPAKOTE ER) 250 MG 24 hr tablet Take 1,250 mg by mouth every evening.  Marland Kitchen levothyroxine (SYNTHROID, LEVOTHROID) 112 MCG tablet Take 1 tablet (112 mcg total) by mouth daily.  . Multiple Vitamins-Minerals (PRESERVISION AREDS 2) CAPS Take 1 capsule by mouth 2 (two) times daily.  . rivastigmine (EXELON) 13.3 MG/24HR APPLY 1 PATCH TO SKIN QAM  . TURMERIC CURCUMIN PO Take by mouth.  . Vitamin D, Ergocalciferol, (DRISDOL) 50000 units CAPS capsule TAKE 1 CAPSULE BY MOUTH EVERY 7 DAYS  . Diclofenac Sodium (PENNSAID) 2 % SOLN Place 1 application onto the skin 2 (two) times daily. (Patient not taking: Reported on 04/02/2019)  . eszopiclone (LUNESTA) 2 MG TABS Take 2 mg by mouth at bedtime as needed for sleep. Take immediately before bedtime  . furosemide (LASIX) 20 MG tablet Take 1 tablet (20 mg total) by mouth daily. (Patient not taking: Reported on 03/23/2019)   No facility-administered encounter medications on file as of 04/02/2019.      ROS: Gen: no fever, chills  Skin: no rash, itching Resp: no cough, wheeze,SOB CV: no CP, palpitations,  + LE edema d/t venous insufficiency   GI: no heartburn, n/v/d/c, abd pain GU: as above in HPI  MSK: no joint pain, myalgias, back pain Neuro: no dizziness, headache, weakness, + balance issues, difficulty transferring    No Known Allergies  BP 122/70   Pulse 82   Temp 98.1 F (36.7 C) (Oral)   Ht 5' 2.25" (1.581 m)   Wt 194 lb 9.6 oz (88.3 kg)   SpO2 99%   BMI 35.31 kg/m   Physical Exam  Constitutional: She appears well-developed and well-nourished. No distress.  Cardiovascular: Normal rate and regular rhythm.   Pulmonary/Chest: Effort normal and breath sounds normal.  Musculoskeletal:        General: Edema: 1+ B/L pedal edema.  Neurological: She is alert.  Skin: Skin is warm and dry.  Chronic  venous stasis changes B/L LE     A/P:  1. Venous insufficiency of both lower extremities - swelling improved compared to 10 days ago - pt has been elevating legs more - compression stockings were effective but pt has great difficulty getting them on/off - not on lasix at this time - f/u PRN  2. Muscular deconditioning 3. Generalized weakness - PT referral placed on 8/3 but pt has yet to be contacted. Will ask front office staff to f/u on this referral and then contact husband with update  I personally spent 25 min with the patient today and greater than 50% was spent in counseling, coordination of care, education

## 2019-04-07 DIAGNOSIS — S90112A Contusion of left great toe without damage to nail, initial encounter: Secondary | ICD-10-CM | POA: Diagnosis not present

## 2019-04-07 DIAGNOSIS — S61402A Unspecified open wound of left hand, initial encounter: Secondary | ICD-10-CM | POA: Diagnosis not present

## 2019-04-07 DIAGNOSIS — R079 Chest pain, unspecified: Secondary | ICD-10-CM | POA: Diagnosis not present

## 2019-04-07 DIAGNOSIS — B351 Tinea unguium: Secondary | ICD-10-CM | POA: Diagnosis not present

## 2019-04-14 ENCOUNTER — Other Ambulatory Visit: Payer: Self-pay | Admitting: Family Medicine

## 2019-04-15 DIAGNOSIS — E785 Hyperlipidemia, unspecified: Secondary | ICD-10-CM | POA: Diagnosis not present

## 2019-04-15 DIAGNOSIS — G4733 Obstructive sleep apnea (adult) (pediatric): Secondary | ICD-10-CM | POA: Diagnosis not present

## 2019-04-15 DIAGNOSIS — I872 Venous insufficiency (chronic) (peripheral): Secondary | ICD-10-CM | POA: Diagnosis not present

## 2019-04-15 DIAGNOSIS — N951 Menopausal and female climacteric states: Secondary | ICD-10-CM | POA: Diagnosis not present

## 2019-04-15 DIAGNOSIS — M353 Polymyalgia rheumatica: Secondary | ICD-10-CM | POA: Diagnosis not present

## 2019-04-15 DIAGNOSIS — E559 Vitamin D deficiency, unspecified: Secondary | ICD-10-CM | POA: Diagnosis not present

## 2019-04-15 DIAGNOSIS — M170 Bilateral primary osteoarthritis of knee: Secondary | ICD-10-CM | POA: Diagnosis not present

## 2019-04-15 DIAGNOSIS — E039 Hypothyroidism, unspecified: Secondary | ICD-10-CM | POA: Diagnosis not present

## 2019-04-15 DIAGNOSIS — S60411D Abrasion of left index finger, subsequent encounter: Secondary | ICD-10-CM | POA: Diagnosis not present

## 2019-04-15 DIAGNOSIS — K5730 Diverticulosis of large intestine without perforation or abscess without bleeding: Secondary | ICD-10-CM | POA: Diagnosis not present

## 2019-04-15 DIAGNOSIS — Z87891 Personal history of nicotine dependence: Secondary | ICD-10-CM | POA: Diagnosis not present

## 2019-04-15 DIAGNOSIS — Z9181 History of falling: Secondary | ICD-10-CM | POA: Diagnosis not present

## 2019-04-15 DIAGNOSIS — Z86010 Personal history of colonic polyps: Secondary | ICD-10-CM | POA: Diagnosis not present

## 2019-04-20 ENCOUNTER — Other Ambulatory Visit: Payer: Self-pay | Admitting: Family Medicine

## 2019-04-22 ENCOUNTER — Telehealth: Payer: Self-pay

## 2019-04-22 NOTE — Telephone Encounter (Signed)
Dr. Loletha Grayer okay for me to give verbal?  Copied from [place] [id number]. Topic: General - Other >> Apr 22, 2019  9:04 AM Virl Axe D wrote: Reason for CRM: Pt's husband stated that [facility] told him that they cannot start services for pt until Dr. Bryan Lemma approves. Please advise.

## 2019-04-22 NOTE — Telephone Encounter (Signed)
Yes ok to give verbal order

## 2019-04-22 NOTE — Telephone Encounter (Signed)
Spoke with [facility] gave verbal order and updated Dr. Loletha Grayer office number address and fax.

## 2019-04-23 DIAGNOSIS — E785 Hyperlipidemia, unspecified: Secondary | ICD-10-CM | POA: Diagnosis not present

## 2019-04-23 DIAGNOSIS — I872 Venous insufficiency (chronic) (peripheral): Secondary | ICD-10-CM | POA: Diagnosis not present

## 2019-04-23 DIAGNOSIS — Z86010 Personal history of colonic polyps: Secondary | ICD-10-CM | POA: Diagnosis not present

## 2019-04-23 DIAGNOSIS — E039 Hypothyroidism, unspecified: Secondary | ICD-10-CM | POA: Diagnosis not present

## 2019-04-23 DIAGNOSIS — Z87891 Personal history of nicotine dependence: Secondary | ICD-10-CM | POA: Diagnosis not present

## 2019-04-23 DIAGNOSIS — S60411D Abrasion of left index finger, subsequent encounter: Secondary | ICD-10-CM | POA: Diagnosis not present

## 2019-04-23 DIAGNOSIS — K5730 Diverticulosis of large intestine without perforation or abscess without bleeding: Secondary | ICD-10-CM | POA: Diagnosis not present

## 2019-04-23 DIAGNOSIS — E559 Vitamin D deficiency, unspecified: Secondary | ICD-10-CM | POA: Diagnosis not present

## 2019-04-23 DIAGNOSIS — M353 Polymyalgia rheumatica: Secondary | ICD-10-CM | POA: Diagnosis not present

## 2019-04-23 DIAGNOSIS — N951 Menopausal and female climacteric states: Secondary | ICD-10-CM | POA: Diagnosis not present

## 2019-04-23 DIAGNOSIS — G4733 Obstructive sleep apnea (adult) (pediatric): Secondary | ICD-10-CM | POA: Diagnosis not present

## 2019-04-23 DIAGNOSIS — M170 Bilateral primary osteoarthritis of knee: Secondary | ICD-10-CM | POA: Diagnosis not present

## 2019-04-23 DIAGNOSIS — Z9181 History of falling: Secondary | ICD-10-CM | POA: Diagnosis not present

## 2019-04-24 DIAGNOSIS — M170 Bilateral primary osteoarthritis of knee: Secondary | ICD-10-CM | POA: Diagnosis not present

## 2019-04-24 DIAGNOSIS — Z87891 Personal history of nicotine dependence: Secondary | ICD-10-CM | POA: Diagnosis not present

## 2019-04-24 DIAGNOSIS — E559 Vitamin D deficiency, unspecified: Secondary | ICD-10-CM | POA: Diagnosis not present

## 2019-04-24 DIAGNOSIS — S60411D Abrasion of left index finger, subsequent encounter: Secondary | ICD-10-CM | POA: Diagnosis not present

## 2019-04-24 DIAGNOSIS — K5730 Diverticulosis of large intestine without perforation or abscess without bleeding: Secondary | ICD-10-CM | POA: Diagnosis not present

## 2019-04-24 DIAGNOSIS — G4733 Obstructive sleep apnea (adult) (pediatric): Secondary | ICD-10-CM | POA: Diagnosis not present

## 2019-04-24 DIAGNOSIS — E039 Hypothyroidism, unspecified: Secondary | ICD-10-CM | POA: Diagnosis not present

## 2019-04-24 DIAGNOSIS — N951 Menopausal and female climacteric states: Secondary | ICD-10-CM | POA: Diagnosis not present

## 2019-04-24 DIAGNOSIS — Z9181 History of falling: Secondary | ICD-10-CM | POA: Diagnosis not present

## 2019-04-24 DIAGNOSIS — I872 Venous insufficiency (chronic) (peripheral): Secondary | ICD-10-CM | POA: Diagnosis not present

## 2019-04-24 DIAGNOSIS — Z86010 Personal history of colonic polyps: Secondary | ICD-10-CM | POA: Diagnosis not present

## 2019-04-24 DIAGNOSIS — M353 Polymyalgia rheumatica: Secondary | ICD-10-CM | POA: Diagnosis not present

## 2019-04-24 DIAGNOSIS — E785 Hyperlipidemia, unspecified: Secondary | ICD-10-CM | POA: Diagnosis not present

## 2019-04-28 DIAGNOSIS — Z86010 Personal history of colonic polyps: Secondary | ICD-10-CM | POA: Diagnosis not present

## 2019-04-28 DIAGNOSIS — K5730 Diverticulosis of large intestine without perforation or abscess without bleeding: Secondary | ICD-10-CM | POA: Diagnosis not present

## 2019-04-28 DIAGNOSIS — M170 Bilateral primary osteoarthritis of knee: Secondary | ICD-10-CM | POA: Diagnosis not present

## 2019-04-28 DIAGNOSIS — E039 Hypothyroidism, unspecified: Secondary | ICD-10-CM | POA: Diagnosis not present

## 2019-04-28 DIAGNOSIS — Z87891 Personal history of nicotine dependence: Secondary | ICD-10-CM | POA: Diagnosis not present

## 2019-04-28 DIAGNOSIS — M353 Polymyalgia rheumatica: Secondary | ICD-10-CM | POA: Diagnosis not present

## 2019-04-28 DIAGNOSIS — I872 Venous insufficiency (chronic) (peripheral): Secondary | ICD-10-CM | POA: Diagnosis not present

## 2019-04-28 DIAGNOSIS — E559 Vitamin D deficiency, unspecified: Secondary | ICD-10-CM | POA: Diagnosis not present

## 2019-04-28 DIAGNOSIS — G4733 Obstructive sleep apnea (adult) (pediatric): Secondary | ICD-10-CM | POA: Diagnosis not present

## 2019-04-28 DIAGNOSIS — E785 Hyperlipidemia, unspecified: Secondary | ICD-10-CM | POA: Diagnosis not present

## 2019-04-28 DIAGNOSIS — N951 Menopausal and female climacteric states: Secondary | ICD-10-CM | POA: Diagnosis not present

## 2019-04-28 DIAGNOSIS — Z9181 History of falling: Secondary | ICD-10-CM | POA: Diagnosis not present

## 2019-04-28 DIAGNOSIS — S60411D Abrasion of left index finger, subsequent encounter: Secondary | ICD-10-CM | POA: Diagnosis not present

## 2019-04-30 DIAGNOSIS — Z87891 Personal history of nicotine dependence: Secondary | ICD-10-CM | POA: Diagnosis not present

## 2019-04-30 DIAGNOSIS — M170 Bilateral primary osteoarthritis of knee: Secondary | ICD-10-CM | POA: Diagnosis not present

## 2019-04-30 DIAGNOSIS — E039 Hypothyroidism, unspecified: Secondary | ICD-10-CM | POA: Diagnosis not present

## 2019-04-30 DIAGNOSIS — N951 Menopausal and female climacteric states: Secondary | ICD-10-CM | POA: Diagnosis not present

## 2019-04-30 DIAGNOSIS — S60411D Abrasion of left index finger, subsequent encounter: Secondary | ICD-10-CM | POA: Diagnosis not present

## 2019-04-30 DIAGNOSIS — Z9181 History of falling: Secondary | ICD-10-CM | POA: Diagnosis not present

## 2019-04-30 DIAGNOSIS — E559 Vitamin D deficiency, unspecified: Secondary | ICD-10-CM | POA: Diagnosis not present

## 2019-04-30 DIAGNOSIS — M353 Polymyalgia rheumatica: Secondary | ICD-10-CM | POA: Diagnosis not present

## 2019-04-30 DIAGNOSIS — G4733 Obstructive sleep apnea (adult) (pediatric): Secondary | ICD-10-CM | POA: Diagnosis not present

## 2019-04-30 DIAGNOSIS — I872 Venous insufficiency (chronic) (peripheral): Secondary | ICD-10-CM | POA: Diagnosis not present

## 2019-04-30 DIAGNOSIS — Z86010 Personal history of colonic polyps: Secondary | ICD-10-CM | POA: Diagnosis not present

## 2019-04-30 DIAGNOSIS — K5730 Diverticulosis of large intestine without perforation or abscess without bleeding: Secondary | ICD-10-CM | POA: Diagnosis not present

## 2019-04-30 DIAGNOSIS — E785 Hyperlipidemia, unspecified: Secondary | ICD-10-CM | POA: Diagnosis not present

## 2019-05-05 DIAGNOSIS — Z86010 Personal history of colonic polyps: Secondary | ICD-10-CM | POA: Diagnosis not present

## 2019-05-05 DIAGNOSIS — K5730 Diverticulosis of large intestine without perforation or abscess without bleeding: Secondary | ICD-10-CM | POA: Diagnosis not present

## 2019-05-05 DIAGNOSIS — M353 Polymyalgia rheumatica: Secondary | ICD-10-CM | POA: Diagnosis not present

## 2019-05-05 DIAGNOSIS — I872 Venous insufficiency (chronic) (peripheral): Secondary | ICD-10-CM | POA: Diagnosis not present

## 2019-05-05 DIAGNOSIS — N951 Menopausal and female climacteric states: Secondary | ICD-10-CM | POA: Diagnosis not present

## 2019-05-05 DIAGNOSIS — G4733 Obstructive sleep apnea (adult) (pediatric): Secondary | ICD-10-CM | POA: Diagnosis not present

## 2019-05-05 DIAGNOSIS — Z9181 History of falling: Secondary | ICD-10-CM | POA: Diagnosis not present

## 2019-05-05 DIAGNOSIS — E785 Hyperlipidemia, unspecified: Secondary | ICD-10-CM | POA: Diagnosis not present

## 2019-05-05 DIAGNOSIS — E559 Vitamin D deficiency, unspecified: Secondary | ICD-10-CM | POA: Diagnosis not present

## 2019-05-05 DIAGNOSIS — Z87891 Personal history of nicotine dependence: Secondary | ICD-10-CM | POA: Diagnosis not present

## 2019-05-05 DIAGNOSIS — M170 Bilateral primary osteoarthritis of knee: Secondary | ICD-10-CM | POA: Diagnosis not present

## 2019-05-05 DIAGNOSIS — S60411D Abrasion of left index finger, subsequent encounter: Secondary | ICD-10-CM | POA: Diagnosis not present

## 2019-05-05 DIAGNOSIS — E039 Hypothyroidism, unspecified: Secondary | ICD-10-CM | POA: Diagnosis not present

## 2019-05-07 DIAGNOSIS — E559 Vitamin D deficiency, unspecified: Secondary | ICD-10-CM | POA: Diagnosis not present

## 2019-05-07 DIAGNOSIS — Z87891 Personal history of nicotine dependence: Secondary | ICD-10-CM | POA: Diagnosis not present

## 2019-05-07 DIAGNOSIS — E785 Hyperlipidemia, unspecified: Secondary | ICD-10-CM | POA: Diagnosis not present

## 2019-05-07 DIAGNOSIS — I872 Venous insufficiency (chronic) (peripheral): Secondary | ICD-10-CM | POA: Diagnosis not present

## 2019-05-07 DIAGNOSIS — M353 Polymyalgia rheumatica: Secondary | ICD-10-CM | POA: Diagnosis not present

## 2019-05-07 DIAGNOSIS — M170 Bilateral primary osteoarthritis of knee: Secondary | ICD-10-CM | POA: Diagnosis not present

## 2019-05-07 DIAGNOSIS — G4733 Obstructive sleep apnea (adult) (pediatric): Secondary | ICD-10-CM | POA: Diagnosis not present

## 2019-05-07 DIAGNOSIS — S60411D Abrasion of left index finger, subsequent encounter: Secondary | ICD-10-CM | POA: Diagnosis not present

## 2019-05-07 DIAGNOSIS — N951 Menopausal and female climacteric states: Secondary | ICD-10-CM | POA: Diagnosis not present

## 2019-05-07 DIAGNOSIS — Z86010 Personal history of colonic polyps: Secondary | ICD-10-CM | POA: Diagnosis not present

## 2019-05-07 DIAGNOSIS — K5730 Diverticulosis of large intestine without perforation or abscess without bleeding: Secondary | ICD-10-CM | POA: Diagnosis not present

## 2019-05-07 DIAGNOSIS — E039 Hypothyroidism, unspecified: Secondary | ICD-10-CM | POA: Diagnosis not present

## 2019-05-07 DIAGNOSIS — Z9181 History of falling: Secondary | ICD-10-CM | POA: Diagnosis not present

## 2019-05-12 DIAGNOSIS — G4733 Obstructive sleep apnea (adult) (pediatric): Secondary | ICD-10-CM | POA: Diagnosis not present

## 2019-05-12 DIAGNOSIS — K5730 Diverticulosis of large intestine without perforation or abscess without bleeding: Secondary | ICD-10-CM | POA: Diagnosis not present

## 2019-05-12 DIAGNOSIS — E039 Hypothyroidism, unspecified: Secondary | ICD-10-CM | POA: Diagnosis not present

## 2019-05-12 DIAGNOSIS — Z9181 History of falling: Secondary | ICD-10-CM | POA: Diagnosis not present

## 2019-05-12 DIAGNOSIS — M353 Polymyalgia rheumatica: Secondary | ICD-10-CM | POA: Diagnosis not present

## 2019-05-12 DIAGNOSIS — E785 Hyperlipidemia, unspecified: Secondary | ICD-10-CM | POA: Diagnosis not present

## 2019-05-12 DIAGNOSIS — S60411D Abrasion of left index finger, subsequent encounter: Secondary | ICD-10-CM | POA: Diagnosis not present

## 2019-05-12 DIAGNOSIS — I872 Venous insufficiency (chronic) (peripheral): Secondary | ICD-10-CM | POA: Diagnosis not present

## 2019-05-12 DIAGNOSIS — Z86010 Personal history of colonic polyps: Secondary | ICD-10-CM | POA: Diagnosis not present

## 2019-05-12 DIAGNOSIS — E559 Vitamin D deficiency, unspecified: Secondary | ICD-10-CM | POA: Diagnosis not present

## 2019-05-12 DIAGNOSIS — Z87891 Personal history of nicotine dependence: Secondary | ICD-10-CM | POA: Diagnosis not present

## 2019-05-12 DIAGNOSIS — M170 Bilateral primary osteoarthritis of knee: Secondary | ICD-10-CM | POA: Diagnosis not present

## 2019-05-12 DIAGNOSIS — N951 Menopausal and female climacteric states: Secondary | ICD-10-CM | POA: Diagnosis not present

## 2019-05-14 DIAGNOSIS — E785 Hyperlipidemia, unspecified: Secondary | ICD-10-CM | POA: Diagnosis not present

## 2019-05-14 DIAGNOSIS — I872 Venous insufficiency (chronic) (peripheral): Secondary | ICD-10-CM | POA: Diagnosis not present

## 2019-05-14 DIAGNOSIS — K5730 Diverticulosis of large intestine without perforation or abscess without bleeding: Secondary | ICD-10-CM | POA: Diagnosis not present

## 2019-05-14 DIAGNOSIS — E559 Vitamin D deficiency, unspecified: Secondary | ICD-10-CM | POA: Diagnosis not present

## 2019-05-14 DIAGNOSIS — Z86010 Personal history of colonic polyps: Secondary | ICD-10-CM | POA: Diagnosis not present

## 2019-05-14 DIAGNOSIS — E039 Hypothyroidism, unspecified: Secondary | ICD-10-CM | POA: Diagnosis not present

## 2019-05-14 DIAGNOSIS — M353 Polymyalgia rheumatica: Secondary | ICD-10-CM | POA: Diagnosis not present

## 2019-05-14 DIAGNOSIS — Z9181 History of falling: Secondary | ICD-10-CM | POA: Diagnosis not present

## 2019-05-14 DIAGNOSIS — N951 Menopausal and female climacteric states: Secondary | ICD-10-CM | POA: Diagnosis not present

## 2019-05-14 DIAGNOSIS — Z87891 Personal history of nicotine dependence: Secondary | ICD-10-CM | POA: Diagnosis not present

## 2019-05-14 DIAGNOSIS — S60411D Abrasion of left index finger, subsequent encounter: Secondary | ICD-10-CM | POA: Diagnosis not present

## 2019-05-14 DIAGNOSIS — G4733 Obstructive sleep apnea (adult) (pediatric): Secondary | ICD-10-CM | POA: Diagnosis not present

## 2019-05-14 DIAGNOSIS — M170 Bilateral primary osteoarthritis of knee: Secondary | ICD-10-CM | POA: Diagnosis not present

## 2019-05-19 DIAGNOSIS — I872 Venous insufficiency (chronic) (peripheral): Secondary | ICD-10-CM | POA: Diagnosis not present

## 2019-05-19 DIAGNOSIS — Z87891 Personal history of nicotine dependence: Secondary | ICD-10-CM | POA: Diagnosis not present

## 2019-05-19 DIAGNOSIS — S60411D Abrasion of left index finger, subsequent encounter: Secondary | ICD-10-CM | POA: Diagnosis not present

## 2019-05-19 DIAGNOSIS — E785 Hyperlipidemia, unspecified: Secondary | ICD-10-CM | POA: Diagnosis not present

## 2019-05-19 DIAGNOSIS — G4733 Obstructive sleep apnea (adult) (pediatric): Secondary | ICD-10-CM | POA: Diagnosis not present

## 2019-05-19 DIAGNOSIS — M170 Bilateral primary osteoarthritis of knee: Secondary | ICD-10-CM | POA: Diagnosis not present

## 2019-05-19 DIAGNOSIS — N951 Menopausal and female climacteric states: Secondary | ICD-10-CM | POA: Diagnosis not present

## 2019-05-19 DIAGNOSIS — E559 Vitamin D deficiency, unspecified: Secondary | ICD-10-CM | POA: Diagnosis not present

## 2019-05-19 DIAGNOSIS — E039 Hypothyroidism, unspecified: Secondary | ICD-10-CM | POA: Diagnosis not present

## 2019-05-19 DIAGNOSIS — K5730 Diverticulosis of large intestine without perforation or abscess without bleeding: Secondary | ICD-10-CM | POA: Diagnosis not present

## 2019-05-19 DIAGNOSIS — Z86010 Personal history of colonic polyps: Secondary | ICD-10-CM | POA: Diagnosis not present

## 2019-05-19 DIAGNOSIS — Z9181 History of falling: Secondary | ICD-10-CM | POA: Diagnosis not present

## 2019-05-19 DIAGNOSIS — M353 Polymyalgia rheumatica: Secondary | ICD-10-CM | POA: Diagnosis not present

## 2019-05-26 DIAGNOSIS — N951 Menopausal and female climacteric states: Secondary | ICD-10-CM | POA: Diagnosis not present

## 2019-05-26 DIAGNOSIS — E039 Hypothyroidism, unspecified: Secondary | ICD-10-CM | POA: Diagnosis not present

## 2019-05-26 DIAGNOSIS — K5730 Diverticulosis of large intestine without perforation or abscess without bleeding: Secondary | ICD-10-CM | POA: Diagnosis not present

## 2019-05-26 DIAGNOSIS — Z87891 Personal history of nicotine dependence: Secondary | ICD-10-CM | POA: Diagnosis not present

## 2019-05-26 DIAGNOSIS — M353 Polymyalgia rheumatica: Secondary | ICD-10-CM | POA: Diagnosis not present

## 2019-05-26 DIAGNOSIS — I872 Venous insufficiency (chronic) (peripheral): Secondary | ICD-10-CM | POA: Diagnosis not present

## 2019-05-26 DIAGNOSIS — S60411D Abrasion of left index finger, subsequent encounter: Secondary | ICD-10-CM | POA: Diagnosis not present

## 2019-05-26 DIAGNOSIS — Z9181 History of falling: Secondary | ICD-10-CM | POA: Diagnosis not present

## 2019-05-26 DIAGNOSIS — Z86010 Personal history of colonic polyps: Secondary | ICD-10-CM | POA: Diagnosis not present

## 2019-05-26 DIAGNOSIS — E785 Hyperlipidemia, unspecified: Secondary | ICD-10-CM | POA: Diagnosis not present

## 2019-05-26 DIAGNOSIS — E559 Vitamin D deficiency, unspecified: Secondary | ICD-10-CM | POA: Diagnosis not present

## 2019-05-26 DIAGNOSIS — G4733 Obstructive sleep apnea (adult) (pediatric): Secondary | ICD-10-CM | POA: Diagnosis not present

## 2019-05-26 DIAGNOSIS — M170 Bilateral primary osteoarthritis of knee: Secondary | ICD-10-CM | POA: Diagnosis not present

## 2019-07-22 ENCOUNTER — Other Ambulatory Visit: Payer: Self-pay | Admitting: Family Medicine

## 2019-08-27 ENCOUNTER — Other Ambulatory Visit: Payer: Self-pay

## 2019-08-27 ENCOUNTER — Ambulatory Visit (INDEPENDENT_AMBULATORY_CARE_PROVIDER_SITE_OTHER): Payer: Medicare Other | Admitting: Family Medicine

## 2019-08-27 ENCOUNTER — Encounter: Payer: Self-pay | Admitting: Family Medicine

## 2019-08-27 VITALS — BP 100/80 | HR 80 | Ht 62.0 in | Wt 192.0 lb

## 2019-08-27 DIAGNOSIS — M25562 Pain in left knee: Secondary | ICD-10-CM | POA: Diagnosis not present

## 2019-08-27 DIAGNOSIS — G8929 Other chronic pain: Secondary | ICD-10-CM

## 2019-08-27 DIAGNOSIS — M1712 Unilateral primary osteoarthritis, left knee: Secondary | ICD-10-CM

## 2019-08-27 NOTE — Assessment & Plan Note (Signed)
Patient given injection today.  Discussed that she could be a candidate for viscosupplementation.  Patient wanted to avoid any type of surgical intervention at this moment.  Wanting to avoid bracing as well.  We will continue to walk with the aid of a cane.  Patient will continue to do this as well.  Follow-up with me again in 4 to 8 weeks

## 2019-08-27 NOTE — Progress Notes (Signed)
Emily Castaneda [address] [employer] Phone: [phone] Subjective:   I Emily Castaneda am serving as a Education administrator for Dr. Hulan Saas.  This visit occurred during the SARS-CoV-2 public health emergency.  Safety protocols were in place, including screening questions prior to the visit, additional usage of staff PPE, and extensive cleaning of exam room while observing appropriate contact time as indicated for disinfecting solutions.     CC: Left knee pain  [id number]   10/07/2017 Patient given injection and tolerated the procedure well.  We discussed icing regimen and home exercises.  Discussed with patient could be a candidate for Visco supplementation if needed.  Patient will be fitted for a custom brace.  This is secondary to patient's abnormal thigh to calf ratio.  Follow-up again 4 weeks  08/27/2019 Emily Castaneda is a 85 y.o. female coming in with complaint of left knee pain. Patient states she fell recently. States the right knee is doing well.    Onset- chronic  Location - knee cap   Character- sharp  Aggravating factors- flexion  Reliving factors-  Therapies tried- topical  Severity-8 out of 10 Patient did see another provider 8 months ago and was given an injection at that time.  Patient states that unfortunately pain is come back at this moment.    Past Medical History:  Diagnosis Date  . Depression    Dr. Abner Greenspan  . Diverticulosis of colon   . Endometriosis   . Hx of colonic polyps   . Hyperlipidemia   . Hypothyroidism   . Manic, depressive ([place])   . Menopausal syndrome   . OSA (obstructive sleep apnea)    cpap; 17 cm (clint young)   Past Surgical History:  Procedure Laterality Date  . APPENDECTOMY    . COLONOSCOPY    . POLYPECTOMY     Dr. Ubaldo Glassing  . TONSILLECTOMY AND ADENOIDECTOMY     Social History   Socioeconomic History  . Marital status: Married    Spouse name: Not on file  . Number of children: Not on  file  . Years of education: Not on file  . Highest education level: Not on file  Occupational History  . Occupation: retired  Tobacco Use  . Smoking status: Former Smoker    Packs/day: 0.10    Years: 10.00    Pack years: 1.00    Types: Cigarettes    Quit date: 08/21/1983    Years since quitting: 36.0  . Smokeless tobacco: Never Used  . Tobacco comment: only smoked socially  Substance and Sexual Activity  . Alcohol use: Yes    Alcohol/week: 1.0 - 2.0 standard drinks    Types: 1 - 2 Standard drinks or equivalent per week    Comment: occ glass of wine   . Drug use: No  . Sexual activity: Not on file  Other Topics Concern  . Not on file  Social History Narrative  . Not on file   Social Determinants of Health   Financial Resource Strain:   . Difficulty of Paying Living Expenses: Not on file  Food Insecurity:   . Worried About Charity fundraiser in the Last Year: Not on file  . Ran Out of Food in the Last Year: Not on file  Transportation Needs:   . Lack of Transportation (Medical): Not on file  . Lack of Transportation (Non-Medical): Not on file  Physical Activity:   . Days of Exercise per Week: Not on  file  . Minutes of Exercise per Session: Not on file  Stress:   . Feeling of Stress : Not on file  Social Connections:   . Frequency of Communication with Friends and Family: Not on file  . Frequency of Social Gatherings with Friends and Family: Not on file  . Attends Religious Services: Not on file  . Active Member of Clubs or Organizations: Not on file  . Attends Archivist Meetings: Not on file  . Marital Status: Not on file   No Known Allergies Family History  Problem Relation Age of Onset  . Diabetes Mother   . Heart disease Father        complications of coronary heart disease  . Diabetes Brother   . Heart disease Other   . Cancer Other        BREAST AND LUNG   . Breast cancer Maternal Aunt   . Cancer Maternal Grandfather        LUNG  . Breast  cancer Daughter   . Colon cancer Neg Hx   . Rectal cancer Neg Hx   . Stomach cancer Neg Hx     Current Outpatient Medications (Endocrine & Metabolic):  .  levothyroxine (SYNTHROID) 112 MCG tablet, TAKE 1 TABLET(112 MCG) BY MOUTH DAILY  Current Outpatient Medications (Cardiovascular):  .  atorvastatin (LIPITOR) 10 MG tablet, TAKE 1 TABLET BY MOUTH DAILY     Current Outpatient Medications (Other):  Marland Kitchen  Cholecalciferol (VITAMIN D3) 2000 units TABS, Take 1 capsule by mouth daily. .  clonazePAM (KLONOPIN) 1 MG tablet, Take 1 mg by mouth at bedtime.  .  diphenoxylate-atropine (LOMOTIL) 2.5-0.025 MG tablet, TAKE 2 TABLET BY MOUTH FOUR TIMES DAILY AS NEEDED FOR DIARRHEA OR LOOSE STOOL .  divalproex (DEPAKOTE ER) 250 MG 24 hr tablet, Take 1,250 mg by mouth every evening. .  eszopiclone (LUNESTA) 2 MG TABS, Take 2 mg by mouth at bedtime as needed for sleep. Take immediately before bedtime .  Multiple Vitamins-Minerals (PRESERVISION AREDS 2) CAPS, Take 1 capsule by mouth 2 (two) times daily. .  rivastigmine (EXELON) 13.3 MG/24HR, APPLY 1 PATCH TO SKIN QAM .  TURMERIC CURCUMIN PO, Take by mouth. .  Vitamin D, Ergocalciferol, (DRISDOL) 50000 units CAPS capsule, TAKE 1 CAPSULE BY MOUTH EVERY 7 DAYS    Past medical history, social, surgical and family history all reviewed in electronic medical record.  No pertanent information unless stated regarding to the chief complaint.   Review of Systems:  No headache, visual changes, nausea, vomiting, diarrhea, constipation, dizziness, abdominal pain, skin rash, fevers, chills, night sweats, weight loss, swollen lymph nodes, body aches, joint swelling,  chest pain, shortness of breath, mood changes.  Positive muscle aches  Objective  Blood pressure 100/80, pulse 80, height 5\' 2"  (1.575 m), weight 192 lb (87.1 kg), SpO2 97 %.    General: No apparent distress alert and oriented x3 mood t affect minorly muted, dressed appropriately.  HEENT: Pupils equal,  extraocular movements intact  Respiratory: Patient's speak in full sentences and does not appear short of breath  Cardiovascular: 2+ lower extremity edema, non tender, no erythema  Skin: Warm dry intact with no signs of infection or rash on extremities or on axial skeleton.  Abdomen: Soft nontender  Neuro: Cranial nerves II through XII are intact, neurovascularly intact in all extremities with 2+ DTRs and 2+ pulses.  Lymph: No lymphadenopathy of posterior or anterior cervical chain or axillae bilaterally.  Gait antalgic walking with the aid of  a cane MSK: Arthritic changes of multiple joints  Knee: Left valgus deformity noted. Large thigh to calf ratio.  Trace effusion noted Tender to palpation over medial and PF joint line.  ROM full in flexion and extension and lower leg rotation. instability with valgus force.  Mild instability with varus force as well painful patellar compression. Patellar glide with moderate crepitus. Patellar and quadriceps tendons unremarkable. Hamstring and quadriceps strength is normal. Contralateral knee shows arthritic changes as well minorly tender but not as severe.  Instability noted as well  After informed written and verbal consent, patient was seated on exam table. Left knee was prepped with alcohol swab and utilizing anterolateral approach, patient's left knee space was injected with 4:1  marcaine 0.5%: Kenalog 40mg /dL. Patient tolerated the procedure well without immediate complications.    Impression and Recommendations:     This case required medical decision making of moderate complexity. The above documentation has been reviewed and is accurate and complete Lyndal Pulley, DO       Note: This dictation was prepared with Dragon dictation along with smaller phrase technology. Any transcriptional errors that result from this process are unintentional.

## 2019-08-27 NOTE — Patient Instructions (Addendum)
Good to see you Tart cherry 1200 mg at night Vitamin D 2,000 IUs daily See me again in 5 weeks

## 2019-09-01 NOTE — Progress Notes (Signed)
Virtual Visit via Video Note  I connected with patient on 09/02/19 at 11:45 AM EST by audio enabled telemedicine application and verified that I am speaking with the correct person using two identifiers.   THIS ENCOUNTER IS A VIRTUAL VISIT DUE TO COVID-19 - PATIENT WAS NOT SEEN IN THE OFFICE. PATIENT HAS CONSENTED TO VIRTUAL VISIT / TELEMEDICINE VISIT   Location of patient: home  Location of provider: office  I discussed the limitations of evaluation and management by telemedicine and the availability of in person appointments. The patient expressed understanding and agreed to proceed.   Subjective:   Emily Castaneda is a 85 y.o. female who presents for Medicare Annual (Subsequent) preventive examination.  Review of Systems: Home Safety/Smoke Alarms: Feels safe in home. Smoke alarms in place.  Lives with husband in 1 story home. Ramp at entry. Uses cane. Walk-in shower with seat.   Female:      Mammo-  declines     Dexa scan-  declines     CCS- 08/01/12.    Objective:     Vitals:  Unable to assess. This visit is enabled though telemedicine due to Covid 19.   Advanced Directives 09/02/2019 10/28/2015 12/23/2013 11/14/2013  Does Patient Have a Medical Advance Directive? No Yes Patient has advance directive, copy not in chart Patient has advance directive, copy not in chart  Type of Advance Directive - - Living will Living will  Does patient want to make changes to medical advance directive? - - - No change requested  Copy of [place] in Chart? - - - Copy requested from family  Would patient like information on creating a medical advance directive? No - Patient declined - - -  Pre-existing out of facility DNR order (yellow form or pink MOST form) - - - No    Tobacco Social History   Tobacco Use  Smoking Status Former Smoker  . Packs/day: 0.10  . Years: 10.00  . Pack years: 1.00  . Types: Cigarettes  . Quit date: 08/21/1983  . Years since quitting: 36.0    Smokeless Tobacco Never Used  Tobacco Comment   only smoked socially     Counseling given: Not Answered Comment: only smoked socially   Clinical Intake:  Pain : No/denies pain     Past Medical History:  Diagnosis Date  . Depression    Dr. Abner Greenspan  . Diverticulosis of colon   . Endometriosis   . Hx of colonic polyps   . Hyperlipidemia   . Hypothyroidism   . Manic, depressive ([place])   . Menopausal syndrome   . OSA (obstructive sleep apnea)    cpap; 17 cm (clint young)   Past Surgical History:  Procedure Laterality Date  . APPENDECTOMY    . COLONOSCOPY    . POLYPECTOMY     Dr. Ubaldo Glassing  . TONSILLECTOMY AND ADENOIDECTOMY     Family History  Problem Relation Age of Onset  . Diabetes Mother   . Heart disease Father        complications of coronary heart disease  . Diabetes Brother   . Heart disease Other   . Cancer Other        BREAST AND LUNG   . Breast cancer Maternal Aunt   . Cancer Maternal Grandfather        LUNG  . Breast cancer Daughter   . Colon cancer Neg Hx   . Rectal cancer Neg Hx   . Stomach cancer Neg Hx  Social History   Socioeconomic History  . Marital status: Married    Spouse name: Not on file  . Number of children: Not on file  . Years of education: Not on file  . Highest education level: Not on file  Occupational History  . Occupation: retired  Tobacco Use  . Smoking status: Former Smoker    Packs/day: 0.10    Years: 10.00    Pack years: 1.00    Types: Cigarettes    Quit date: 08/21/1983    Years since quitting: 36.0  . Smokeless tobacco: Never Used  . Tobacco comment: only smoked socially  Substance and Sexual Activity  . Alcohol use: Yes    Alcohol/week: 1.0 - 2.0 standard drinks    Types: 1 - 2 Standard drinks or equivalent per week    Comment: occ glass of wine   . Drug use: No  . Sexual activity: Not on file  Other Topics Concern  . Not on file  Social History Narrative  . Not on file   Social Determinants of Health    Financial Resource Strain:   . Difficulty of Paying Living Expenses: Not on file  Food Insecurity:   . Worried About Charity fundraiser in the Last Year: Not on file  . Ran Out of Food in the Last Year: Not on file  Transportation Needs:   . Lack of Transportation (Medical): Not on file  . Lack of Transportation (Non-Medical): Not on file  Physical Activity:   . Days of Exercise per Week: Not on file  . Minutes of Exercise per Session: Not on file  Stress:   . Feeling of Stress : Not on file  Social Connections:   . Frequency of Communication with Friends and Family: Not on file  . Frequency of Social Gatherings with Friends and Family: Not on file  . Attends Religious Services: Not on file  . Active Member of Clubs or Organizations: Not on file  . Attends Archivist Meetings: Not on file  . Marital Status: Not on file    Outpatient Encounter Medications as of 09/02/2019  Medication Sig  . atorvastatin (LIPITOR) 10 MG tablet TAKE 1 TABLET BY MOUTH DAILY  . Cholecalciferol (VITAMIN D3) 2000 units TABS Take 1 capsule by mouth daily.  . clonazePAM (KLONOPIN) 1 MG tablet Take 1 mg by mouth at bedtime.   . diphenoxylate-atropine (LOMOTIL) 2.5-0.025 MG tablet TAKE 2 TABLET BY MOUTH FOUR TIMES DAILY AS NEEDED FOR DIARRHEA OR LOOSE STOOL  . divalproex (DEPAKOTE ER) 250 MG 24 hr tablet Take 1,250 mg by mouth every evening.  . eszopiclone (LUNESTA) 2 MG TABS Take 2 mg by mouth at bedtime as needed for sleep. Take immediately before bedtime  . levothyroxine (SYNTHROID) 112 MCG tablet TAKE 1 TABLET(112 MCG) BY MOUTH DAILY  . Multiple Vitamins-Minerals (PRESERVISION AREDS 2) CAPS Take 1 capsule by mouth 2 (two) times daily.  . rivastigmine (EXELON) 13.3 MG/24HR APPLY 1 PATCH TO SKIN QAM  . TURMERIC CURCUMIN PO Take by mouth.  . Vitamin D, Ergocalciferol, (DRISDOL) 50000 units CAPS capsule TAKE 1 CAPSULE BY MOUTH EVERY 7 DAYS   No facility-administered encounter medications on  file as of 09/02/2019.    Activities of Daily Living In your present state of health, do you have any difficulty performing the following activities: 09/02/2019  Hearing? N  Vision? N  Difficulty concentrating or making decisions? N  Walking or climbing stairs? Y  Dressing or bathing? N  Doing  errands, shopping? N  Preparing Food and eating ? N  Using the Toilet? N  In the past six months, have you accidently leaked urine? N  Do you have problems with loss of bowel control? N  Managing your Medications? Y  Managing your Finances? Y  Housekeeping or managing your Housekeeping? Y  Comment has someone that comes every 3 wks.  Some recent data might be hidden    Patient Care Team: Ronnald Nian, DO as PCP - General (Family Medicine) Elsie Stain, MD as Consulting Physician (Pulmonary Disease) Lyndal Pulley, DO as Consulting Physician (Family Medicine)    Assessment:   This is a routine wellness examination for [place]. Physical assessment deferred to PCP.  Exercise Activities and Dietary recommendations Current Exercise Habits: The patient does not participate in regular exercise at present, Exercise limited by: None identified Diet (meal preparation, eat out, water intake, caffeinated beverages, dairy products, fruits and vegetables): well balanced   Goals    . pt would like to do some PT       Fall Risk Fall Risk  09/02/2019 10/09/2017 10/28/2014 09/11/2013  Falls in the past year? 1 No No No  Number falls in past yr: 0 - - -  Injury with Fall? 1 - - -  Risk for fall due to : Impaired balance/gait - - -  Follow up Education provided;Falls prevention discussed - - -   Depression Screen PHQ 2/9 Scores 09/02/2019 07/04/2018 10/09/2017 10/28/2014  PHQ - 2 Score 0 0 0 0  PHQ- 9 Score - 0 - -     Cognitive Function   MMSE - Mini Mental State Exam 09/02/2019  Not completed: Unable to complete        Immunization History  Administered Date(s) Administered  .  Influenza Split 09/20/2013  . Influenza, High Dose Seasonal PF 08/07/2016, 09/18/2017, 06/13/2018  . Pneumococcal Conjugate-13 09/11/2013  . Pneumococcal Polysaccharide-23 02/25/2009  . Td 02/25/2009  . Zoster 12/10/2012   Screening Tests Health Maintenance  Topic Date Due  . MAMMOGRAM  06/27/2018  . TETANUS/TDAP  02/26/2019  . INFLUENZA VACCINE  03/21/2019  . DEXA SCAN  Completed  . PNA vac Low Risk Adult  Completed  . COLONOSCOPY  Discontinued     Plan:    Please schedule your next medicare wellness visit with me in 1 yr.  Continue to eat heart healthy diet (full of fruits, vegetables, whole grains, lean protein, water--limit salt, fat, and sugar intake) and increase physical activity as tolerated.  Continue doing brain stimulating activities (puzzles, reading, adult coloring books, staying active) to keep memory sharp.     I have personally reviewed and noted the following in the patient's chart:   . Medical and social history . Use of alcohol, tobacco or illicit drugs  . Current medications and supplements . Functional ability and status . Nutritional status . Physical activity . Advanced directives . List of other physicians . Hospitalizations, surgeries, and ER visits in previous 12 months . Vitals . Screenings to include cognitive, depression, and falls . Referrals and appointments  In addition, I have reviewed and discussed with patient certain preventive protocols, quality metrics, and best practice recommendations. A written personalized care plan for preventive services as well as general preventive health recommendations were provided to patient.     Shela Nevin, [place]  09/02/2019  PCP note: pt states she is interested in being referred to a PT program that may help her with getting up and  down from chairs and balance.

## 2019-09-02 ENCOUNTER — Encounter: Payer: Self-pay | Admitting: *Deleted

## 2019-09-02 ENCOUNTER — Ambulatory Visit (INDEPENDENT_AMBULATORY_CARE_PROVIDER_SITE_OTHER): Payer: Medicare Other | Admitting: *Deleted

## 2019-09-02 DIAGNOSIS — Z0000 Encounter for general adult medical examination without abnormal findings: Secondary | ICD-10-CM

## 2019-09-02 NOTE — Patient Instructions (Signed)
Please schedule your next medicare wellness visit with me in 1 yr.  Continue to eat heart healthy diet (full of fruits, vegetables, whole grains, lean protein, water--limit salt, fat, and sugar intake) and increase physical activity as tolerated.  Continue doing brain stimulating activities (puzzles, reading, adult coloring books, staying active) to keep memory sharp.    Emily Castaneda , Thank you for taking time to come for your Medicare Wellness Visit. I appreciate your ongoing commitment to your health goals. Please review the following plan we discussed and let me know if I can assist you in the future.   These are the goals we discussed: Goals    . pt would like to do some PT       This is a list of the screening recommended for you and due dates:  Health Maintenance  Topic Date Due  . Mammogram  06/27/2018  . Tetanus Vaccine  02/26/2019  . Flu Shot  03/21/2019  . DEXA scan (bone density measurement)  Completed  . Pneumonia vaccines  Completed  . Colon Cancer Screening  Discontinued    Preventive Care 6 Years and Older, Female Preventive care refers to lifestyle choices and visits with your health care provider that can promote health and wellness. This includes:  A yearly physical exam. This is also called an annual well check.  Regular dental and eye exams.  Immunizations.  Screening for certain conditions.  Healthy lifestyle choices, such as diet and exercise. What can I expect for my preventive care visit? Physical exam Your health care provider will check:  Height and weight. These may be used to calculate body mass index (BMI), which is a measurement that tells if you are at a healthy weight.  Heart rate and blood pressure.  Your skin for abnormal spots. Counseling Your health care provider may ask you questions about:  Alcohol, tobacco, and drug use.  Emotional well-being.  Home and relationship well-being.  Sexual activity.  Eating habits.  History  of falls.  Memory and ability to understand (cognition).  Work and work Statistician.  Pregnancy and menstrual history. What immunizations do I need?  Influenza (flu) vaccine  This is recommended every year. Tetanus, diphtheria, and pertussis (Tdap) vaccine  You may need a Td booster every 10 years. Varicella (chickenpox) vaccine  You may need this vaccine if you have not already been vaccinated. Zoster (shingles) vaccine  You may need this after age 89. Pneumococcal conjugate (PCV13) vaccine  One dose is recommended after age 23. Pneumococcal polysaccharide (PPSV23) vaccine  One dose is recommended after age 31. Measles, mumps, and rubella (MMR) vaccine  You may need at least one dose of MMR if you were born in 1957 or later. You may also need a second dose. Meningococcal conjugate (MenACWY) vaccine  You may need this if you have certain conditions. Hepatitis A vaccine  You may need this if you have certain conditions or if you travel or work in places where you may be exposed to hepatitis A. Hepatitis B vaccine  You may need this if you have certain conditions or if you travel or work in places where you may be exposed to hepatitis B. Haemophilus influenzae type b (Hib) vaccine  You may need this if you have certain conditions. You may receive vaccines as individual doses or as more than one vaccine together in one shot (combination vaccines). Talk with your health care provider about the risks and benefits of combination vaccines. What tests do I need?  Blood tests  Lipid and cholesterol levels. These may be checked every 5 years, or more frequently depending on your overall health.  Hepatitis C test.  Hepatitis B test. Screening  Lung cancer screening. You may have this screening every year starting at age 43 if you have a 30-pack-year history of smoking and currently smoke or have quit within the past 15 years.  Colorectal cancer screening. All adults should  have this screening starting at age 31 and continuing until age 57. Your health care provider may recommend screening at age 53 if you are at increased risk. You will have tests every 1-10 years, depending on your results and the type of screening test.  Diabetes screening. This is done by checking your blood sugar (glucose) after you have not eaten for a while (fasting). You may have this done every 1-3 years.  Mammogram. This may be done every 1-2 years. Talk with your health care provider about how often you should have regular mammograms.  BRCA-related cancer screening. This may be done if you have a family history of breast, ovarian, tubal, or peritoneal cancers. Other tests  Sexually transmitted disease (STD) testing.  Bone density scan. This is done to screen for osteoporosis. You may have this done starting at age 73. Follow these instructions at home: Eating and drinking  Eat a diet that includes fresh fruits and vegetables, whole grains, lean protein, and low-fat dairy products. Limit your intake of foods with high amounts of sugar, saturated fats, and salt.  Take vitamin and mineral supplements as recommended by your health care provider.  Do not drink alcohol if your health care provider tells you not to drink.  If you drink alcohol: ? Limit how much you have to 0-1 drink a day. ? Be aware of how much alcohol is in your drink. In the [place] one drink equals one 12 oz bottle of beer (355 mL), one 5 oz glass of wine (148 mL), or one 1 oz glass of hard liquor (44 mL). Lifestyle  Take daily care of your teeth and gums.  Stay active. Exercise for at least 30 minutes on 5 or more days each week.  Do not use any products that contain nicotine or tobacco, such as cigarettes, e-cigarettes, and chewing tobacco. If you need help quitting, ask your health care provider.  If you are sexually active, practice safe sex. Use a condom or other form of protection in order to prevent STIs  (sexually transmitted infections).  Talk with your health care provider about taking a low-dose aspirin or statin. What's next?  Go to your health care provider once a year for a well check visit.  Ask your health care provider how often you should have your eyes and teeth checked.  Stay up to date on all vaccines. This information is not intended to replace advice given to you by your health care provider. Make sure you discuss any questions you have with your health care provider. Document Revised: 07/31/2018 Document Reviewed: 07/31/2018 Elsevier Patient Education  2020 [employer].

## 2019-09-18 ENCOUNTER — Encounter: Payer: Self-pay | Admitting: Family Medicine

## 2019-09-18 ENCOUNTER — Ambulatory Visit (INDEPENDENT_AMBULATORY_CARE_PROVIDER_SITE_OTHER): Payer: Medicare Other | Admitting: Family Medicine

## 2019-09-18 ENCOUNTER — Other Ambulatory Visit: Payer: Self-pay

## 2019-09-18 VITALS — Temp 97.9°F | Ht 62.0 in

## 2019-09-18 DIAGNOSIS — Z7189 Other specified counseling: Secondary | ICD-10-CM

## 2019-09-18 DIAGNOSIS — Z20822 Contact with and (suspected) exposure to covid-19: Secondary | ICD-10-CM | POA: Diagnosis not present

## 2019-09-18 NOTE — Progress Notes (Signed)
Virtual Visit via Telephone Note  I connected with Emily Castaneda on 09/18/19 at  4:00 PM EST by telephone and verified that I am speaking with the correct person using two identifiers.   I discussed the limitations, risks, security and privacy concerns of performing an evaluation and management service by telephone and the availability of in person appointments. I also discussed with the patient that there may be a patient responsible charge related to this service. The patient expressed understanding and agreed to proceed.  Location patient: home Location provider: work  Participants present for the call: patient, provider Patient did not have a visit in the prior 7 days to address this/these issue(s).  Chief Complaint  Patient presents with  . Discussion about covid vaccines    Pt's husband tested positive for Covid 19 x 3week ago.  Pt is calling to find out what are the next steps to take.  Pt has been with the whole time but she has not been tested.  Pt is not having any symptoms.     History of Present Illness: Emily Castaneda is a 85 y.o. female who states her husband tested positive for COVID on 09/01/19. His symptoms have fully resolved.  Pt has not had any symptoms but has not been tested. She does not feel she needs to be tested as she is home w/ husband, does not go out.  Pt is asking about how to get a COVID vaccine. She does not have access to or know how to use the Internet. Both she and her husband are interested in receiving a vaccine as soon as one is available to them.   Past Medical History:  Diagnosis Date  . Depression    Dr. Abner Greenspan  . Diverticulosis of colon   . Endometriosis   . Hx of colonic polyps   . Hyperlipidemia   . Hypothyroidism   . Manic, depressive ([place])   . Menopausal syndrome   . OSA (obstructive sleep apnea)    cpap; 17 cm (clint young)    Past Surgical History:  Procedure Laterality Date  . APPENDECTOMY    . COLONOSCOPY    . POLYPECTOMY      Dr. Ubaldo Glassing  . TONSILLECTOMY AND ADENOIDECTOMY      Social History   Tobacco Use  . Smoking status: Former Smoker    Packs/day: 0.10    Years: 10.00    Pack years: 1.00    Types: Cigarettes    Quit date: 08/21/1983    Years since quitting: 36.1  . Smokeless tobacco: Never Used  . Tobacco comment: only smoked socially  Substance Use Topics  . Alcohol use: Yes    Alcohol/week: 1.0 - 2.0 standard drinks    Types: 1 - 2 Standard drinks or equivalent per week    Comment: occ glass of wine   . Drug use: No    Family History  Problem Relation Age of Onset  . Diabetes Mother   . Heart disease Father        complications of coronary heart disease  . Diabetes Brother   . Heart disease Other   . Cancer Other        BREAST AND LUNG   . Breast cancer Maternal Aunt   . Cancer Maternal Grandfather        LUNG  . Breast cancer Daughter   . Colon cancer Neg Hx   . Rectal cancer Neg Hx   . Stomach cancer Neg Hx  Outpatient Encounter Medications as of 09/18/2019  Medication Sig  . atorvastatin (LIPITOR) 10 MG tablet TAKE 1 TABLET BY MOUTH DAILY  . Cholecalciferol (VITAMIN D3) 2000 units TABS Take 1 capsule by mouth daily.  . clonazePAM (KLONOPIN) 1 MG tablet Take 1 mg by mouth at bedtime.   . diphenoxylate-atropine (LOMOTIL) 2.5-0.025 MG tablet TAKE 2 TABLET BY MOUTH FOUR TIMES DAILY AS NEEDED FOR DIARRHEA OR LOOSE STOOL  . divalproex (DEPAKOTE ER) 250 MG 24 hr tablet Take 1,250 mg by mouth every evening.  . eszopiclone (LUNESTA) 2 MG TABS Take 2 mg by mouth at bedtime as needed for sleep. Take immediately before bedtime  . levothyroxine (SYNTHROID) 112 MCG tablet TAKE 1 TABLET(112 MCG) BY MOUTH DAILY  . Multiple Vitamins-Minerals (PRESERVISION AREDS 2) CAPS Take 1 capsule by mouth 2 (two) times daily.  . rivastigmine (EXELON) 13.3 MG/24HR APPLY 1 PATCH TO SKIN QAM  . TURMERIC CURCUMIN PO Take by mouth.  . Vitamin D, Ergocalciferol, (DRISDOL) 50000 units CAPS capsule TAKE 1  CAPSULE BY MOUTH EVERY 7 DAYS   No facility-administered encounter medications on file as of 09/18/2019.     No Known Allergies    ROS: See pertinent positives and negatives per HPI.   Observations/Objective:  Patient sounds cheerful and well on the phone. I do not appreciate any SOB. Speech and thought processing are grossly intact. Patient reported vitals:  Temp 97.9 F (36.6 C)   Ht 5\' 2"  (1.575 m)   SpO2 96%   BMI 35.12 kg/m    Assessment and Plan:  1. Advice given about COVID-19 virus by telephone 2. Close exposure to COVID-19 virus - husband (who was symptomatic) tested positive on 09/01/19. His symptoms have fully resolved. Pt is asymptomatic and declines testing at this time. She had multiple questions about the covid vaccine, mainly relating to how to register herself for it. Pt does not have access to the internet or know how to use the internet. I was able to go to [email or web address] and added patient (her husband as well, but I expalined he will need to wait 45 days from 09/01/19 to receive vaccine d/t recent COVID infection) to [place] vaccine waitlist. Pt expressed her thanks and stated she had no further questions.  I did not refer this patient for an OV in the next 24 hours for this/these issue(s).  I discussed the assessment and treatment plan with the patient. The patient was provided an opportunity to ask questions and all were answered. The patient agreed with the plan and demonstrated an understanding of the instructions.   The patient was advised to call back or seek an in-person evaluation if the symptoms worsen or if the condition fails to improve as anticipated.  I provided 23 minutes of non-face-to-face time during this encounter.   Letta Median, DO

## 2019-10-25 ENCOUNTER — Ambulatory Visit: Payer: Medicare Other | Attending: Internal Medicine

## 2019-10-25 DIAGNOSIS — Z23 Encounter for immunization: Secondary | ICD-10-CM

## 2019-10-25 NOTE — Progress Notes (Signed)
   Covid-19 Vaccination Clinic  Name:  Emily Castaneda    MRN: [id number] DOB: 11/18/1934  10/25/2019  [name] was observed post Covid-19 immunization for 15 minutes without incident. She was provided with Vaccine Information Sheet and instruction to access the V-Safe system.   [name] was instructed to call 911 with any severe reactions post vaccine: Marland Kitchen Difficulty breathing  . Swelling of face and throat  . A fast heartbeat  . A bad rash all over body  . Dizziness and weakness   Immunizations Administered    Name Date Dose VIS Date Route   Pfizer COVID-19 Vaccine 10/25/2019  6:18 PM 0.3 mL 07/31/2019 Intramuscular   Manufacturer: [place]   Lot: [id number]   [place]: [id number]

## 2019-11-01 ENCOUNTER — Emergency Department (HOSPITAL_BASED_OUTPATIENT_CLINIC_OR_DEPARTMENT_OTHER): Payer: Medicare Other

## 2019-11-01 ENCOUNTER — Other Ambulatory Visit: Payer: Self-pay

## 2019-11-01 ENCOUNTER — Encounter (HOSPITAL_BASED_OUTPATIENT_CLINIC_OR_DEPARTMENT_OTHER): Payer: Self-pay | Admitting: *Deleted

## 2019-11-01 ENCOUNTER — Inpatient Hospital Stay (HOSPITAL_BASED_OUTPATIENT_CLINIC_OR_DEPARTMENT_OTHER)
Admission: EM | Admit: 2019-11-01 | Discharge: 2019-11-05 | DRG: 871 | Disposition: A | Discharge status: Home Health | Payer: Medicare Other | Attending: Family Medicine | Admitting: Family Medicine

## 2019-11-01 ENCOUNTER — Other Ambulatory Visit: Payer: Self-pay | Admitting: Family Medicine

## 2019-11-01 DIAGNOSIS — J690 Pneumonitis due to inhalation of food and vomit: Secondary | ICD-10-CM | POA: Diagnosis present

## 2019-11-01 DIAGNOSIS — Z20822 Contact with and (suspected) exposure to covid-19: Secondary | ICD-10-CM | POA: Diagnosis not present

## 2019-11-01 DIAGNOSIS — Z66 Do not resuscitate: Secondary | ICD-10-CM | POA: Diagnosis present

## 2019-11-01 DIAGNOSIS — Z803 Family history of malignant neoplasm of breast: Secondary | ICD-10-CM | POA: Diagnosis not present

## 2019-11-01 DIAGNOSIS — E039 Hypothyroidism, unspecified: Secondary | ICD-10-CM | POA: Diagnosis not present

## 2019-11-01 DIAGNOSIS — G4733 Obstructive sleep apnea (adult) (pediatric): Secondary | ICD-10-CM | POA: Diagnosis not present

## 2019-11-01 DIAGNOSIS — J9601 Acute respiratory failure with hypoxia: Secondary | ICD-10-CM | POA: Diagnosis present

## 2019-11-01 DIAGNOSIS — Z6834 Body mass index (BMI) 34.0-34.9, adult: Secondary | ICD-10-CM

## 2019-11-01 DIAGNOSIS — F319 Bipolar disorder, unspecified: Secondary | ICD-10-CM | POA: Diagnosis present

## 2019-11-01 DIAGNOSIS — Z79899 Other long term (current) drug therapy: Secondary | ICD-10-CM | POA: Diagnosis not present

## 2019-11-01 DIAGNOSIS — J9600 Acute respiratory failure, unspecified whether with hypoxia or hypercapnia: Secondary | ICD-10-CM | POA: Diagnosis present

## 2019-11-01 DIAGNOSIS — Z743 Need for continuous supervision: Secondary | ICD-10-CM | POA: Diagnosis not present

## 2019-11-01 DIAGNOSIS — R1310 Dysphagia, unspecified: Secondary | ICD-10-CM | POA: Diagnosis not present

## 2019-11-01 DIAGNOSIS — E785 Hyperlipidemia, unspecified: Secondary | ICD-10-CM | POA: Diagnosis not present

## 2019-11-01 DIAGNOSIS — Z833 Family history of diabetes mellitus: Secondary | ICD-10-CM

## 2019-11-01 DIAGNOSIS — R0602 Shortness of breath: Secondary | ICD-10-CM | POA: Diagnosis not present

## 2019-11-01 DIAGNOSIS — F0390 Unspecified dementia without behavioral disturbance: Secondary | ICD-10-CM | POA: Diagnosis present

## 2019-11-01 DIAGNOSIS — E669 Obesity, unspecified: Secondary | ICD-10-CM | POA: Diagnosis present

## 2019-11-01 DIAGNOSIS — Z87891 Personal history of nicotine dependence: Secondary | ICD-10-CM

## 2019-11-01 DIAGNOSIS — Z8249 Family history of ischemic heart disease and other diseases of the circulatory system: Secondary | ICD-10-CM | POA: Diagnosis not present

## 2019-11-01 DIAGNOSIS — I4510 Unspecified right bundle-branch block: Secondary | ICD-10-CM | POA: Diagnosis not present

## 2019-11-01 DIAGNOSIS — J189 Pneumonia, unspecified organism: Secondary | ICD-10-CM

## 2019-11-01 DIAGNOSIS — A419 Sepsis, unspecified organism: Principal | ICD-10-CM | POA: Diagnosis present

## 2019-11-01 DIAGNOSIS — R0902 Hypoxemia: Secondary | ICD-10-CM | POA: Diagnosis not present

## 2019-11-01 DIAGNOSIS — R000 Tachycardia, unspecified: Secondary | ICD-10-CM | POA: Diagnosis not present

## 2019-11-01 DIAGNOSIS — J80 Acute respiratory distress syndrome: Secondary | ICD-10-CM | POA: Diagnosis not present

## 2019-11-01 DIAGNOSIS — D539 Nutritional anemia, unspecified: Secondary | ICD-10-CM | POA: Diagnosis not present

## 2019-11-01 DIAGNOSIS — Z79890 Hormone replacement therapy (postmenopausal): Secondary | ICD-10-CM

## 2019-11-01 DIAGNOSIS — F3130 Bipolar disorder, current episode depressed, mild or moderate severity, unspecified: Secondary | ICD-10-CM | POA: Diagnosis present

## 2019-11-01 LAB — CBC WITH DIFFERENTIAL/PLATELET
Abs Immature Granulocytes: 0.09 10*3/uL — ABNORMAL HIGH (ref 0.00–0.07)
Basophils Absolute: 0.1 10*3/uL (ref 0.0–0.1)
Basophils Relative: 0 %
Eosinophils Absolute: 0.2 10*3/uL (ref 0.0–0.5)
Eosinophils Relative: 1 %
HCT: 36.4 % (ref 36.0–46.0)
Hemoglobin: 12.2 g/dL (ref 12.0–15.0)
Immature Granulocytes: 0 %
Lymphocytes Relative: 9 %
Lymphs Abs: 1.8 10*3/uL (ref 0.7–4.0)
MCH: 33.4 pg (ref 26.0–34.0)
MCHC: 33.5 g/dL (ref 30.0–36.0)
MCV: 99.7 fL (ref 80.0–100.0)
Monocytes Absolute: 1.8 10*3/uL — ABNORMAL HIGH (ref 0.1–1.0)
Monocytes Relative: 9 %
Neutro Abs: 16.5 10*3/uL — ABNORMAL HIGH (ref 1.7–7.7)
Neutrophils Relative %: 81 %
Platelets: 196 10*3/uL (ref 150–400)
RBC: 3.65 MIL/uL — ABNORMAL LOW (ref 3.87–5.11)
RDW: 13.3 % (ref 11.5–15.5)
WBC: 20.5 10*3/uL — ABNORMAL HIGH (ref 4.0–10.5)
nRBC: 0 % (ref 0.0–0.2)

## 2019-11-01 LAB — BASIC METABOLIC PANEL
Anion gap: 11 (ref 5–15)
BUN: 22 mg/dL (ref 8–23)
CO2: 24 mmol/L (ref 22–32)
Calcium: 9.3 mg/dL (ref 8.9–10.3)
Chloride: 104 mmol/L (ref 98–111)
Creatinine, Ser: 0.9 mg/dL (ref 0.44–1.00)
GFR calc Af Amer: >60 mL/min (ref >60)
GFR calc non Af Amer: 60 mL/min — ABNORMAL LOW (ref >60)
Glucose, Bld: 136 mg/dL — ABNORMAL HIGH (ref 70–99)
Potassium: 4 mmol/L (ref 3.5–5.1)
Sodium: 139 mmol/L (ref 135–145)

## 2019-11-01 LAB — PROTIME-INR
INR: 1.2 (ref 0.8–1.2)
Prothrombin Time: 14.9 seconds (ref 11.4–15.2)

## 2019-11-01 LAB — APTT: aPTT: 39 seconds — ABNORMAL HIGH (ref 24–36)

## 2019-11-01 LAB — SARS CORONAVIRUS 2 AG (30 MIN TAT): SARS Coronavirus 2 Ag: NEGATIVE

## 2019-11-01 LAB — LACTIC ACID, PLASMA: Lactic Acid, Venous: 1.9 mmol/L (ref 0.5–1.9)

## 2019-11-01 MED ORDER — SODIUM CHLORIDE 0.9 % IV SOLN
3.0000 g | Freq: Once | INTRAVENOUS | Status: AC
  Administered 2019-11-01: 3 g via INTRAVENOUS
  Filled 2019-11-01: qty 8

## 2019-11-01 MED ORDER — SODIUM CHLORIDE 0.9 % IV SOLN: INTRAVENOUS | Status: DC | PRN

## 2019-11-01 NOTE — ED Provider Notes (Signed)
[facility] EMERGENCY DEPARTMENT Provider Note   CSN: [id number] Arrival date & time: 11/01/19  2250     History Chief Complaint  Patient presents with  . Shortness of Breath    Emily Castaneda is a 85 y.o. female.  HPI     This is an 85 year old female with a history of OSA, hypothyroidism, diverticulosis who presents with shortness of breath and hypoxia.  Per EMS, they were called out for shortness of breath.  Patient noted to have a productive cough over the last day.  Was noted to have O2 sats in the low 80s by EMS.  Is not on home oxygen but unsure of how much.  Patient reports that her symptoms started after taking a pill which she states "it went down the wrong way."  She is not noted any fevers at home.  Denies any chest pain.  Denies any lower extremity swelling that is new from prior.  Patient's husband provides collateral information.  States over the last 24 hours she has had increasing shortness of breath.  Seem to start with attempting to take her pills yesterday.  He had a pulse oximeter at home and noted that her O2 sats were in the 80s.  Past Medical History:  Diagnosis Date  . Depression    Dr. Abner Greenspan  . Diverticulosis of colon   . Endometriosis   . Hx of colonic polyps   . Hyperlipidemia   . Hypothyroidism   . Manic, depressive ([place])   . Menopausal syndrome   . OSA (obstructive sleep apnea)    cpap; 17 cm (clint young)    Patient Active Problem List   Diagnosis Date Noted  . Acute respiratory failure ([place]) 11/02/2019  . Venous insufficiency 03/26/2019  . Edema leg 03/20/2019  . Absence of bladder continence 03/20/2019  . Medication side effect 03/20/2019  . Unstable gait 05/19/2018  . Vitamin D deficiency, unspecified 05/19/2018  . Degenerative arthritis of right knee 10/07/2017  . Morbid obesity ([place]) 06/03/2017  . Degenerative arthritis of left knee 12/19/2016  . Polymyalgia ([place]) 12/19/2016  . Acute bronchiolitis 11/14/2013  .  VULVAR ABSCESS 02/28/2010  . Depression 02/26/2008  . Hypothyroidism 02/26/2007  . Dyslipidemia 02/26/2007  . Bipolar 1 disorder, depressed ([place]) 02/26/2007  . Diverticulosis of large intestine 02/26/2007  . MENOPAUSAL SYNDROME 02/26/2007  . History of colonic polyps 02/26/2007    Past Surgical History:  Procedure Laterality Date  . APPENDECTOMY    . COLONOSCOPY    . POLYPECTOMY     Dr. Ubaldo Glassing  . TONSILLECTOMY AND ADENOIDECTOMY       OB History    Gravida  3   Para  3   Term      Preterm      AB      Living  3     SAB      TAB      Ectopic      Multiple      Live Births              Family History  Problem Relation Age of Onset  . Diabetes Mother   . Heart disease Father        complications of coronary heart disease  . Diabetes Brother   . Heart disease Other   . Cancer Other        BREAST AND LUNG   . Breast cancer Maternal Aunt   . Cancer Maternal Grandfather  LUNG  . Breast cancer Daughter   . Colon cancer Neg Hx   . Rectal cancer Neg Hx   . Stomach cancer Neg Hx     Social History   Tobacco Use  . Smoking status: Former Smoker    Packs/day: 0.10    Years: 10.00    Pack years: 1.00    Types: Cigarettes    Quit date: 08/21/1983    Years since quitting: 36.2  . Smokeless tobacco: Never Used  . Tobacco comment: only smoked socially  Substance Use Topics  . Alcohol use: Yes    Alcohol/week: 1.0 - 2.0 standard drinks    Types: 1 - 2 Standard drinks or equivalent per week    Comment: occ glass of wine   . Drug use: No    Home Medications Prior to Admission medications   Medication Sig Start Date End Date Taking? Authorizing Provider  atorvastatin (LIPITOR) 10 MG tablet TAKE 1 TABLET BY MOUTH DAILY 07/22/19   Cirigliano, Garvin Fila, DO  Cholecalciferol (VITAMIN D3) 2000 units TABS Take 1 capsule by mouth daily.    [provider]  clonazePAM (KLONOPIN) 1 MG tablet Take 1 mg by mouth at bedtime.     [provider]   diphenoxylate-atropine (LOMOTIL) 2.5-0.025 MG tablet TAKE 2 TABLET BY MOUTH FOUR TIMES DAILY AS NEEDED FOR DIARRHEA OR LOOSE STOOL 10/02/18   Cirigliano, Mary K, DO  divalproex (DEPAKOTE ER) 250 MG 24 hr tablet Take 1,250 mg by mouth every evening.    [provider]  eszopiclone (LUNESTA) 2 MG TABS Take 2 mg by mouth at bedtime as needed for sleep. Take immediately before bedtime    [provider]  levothyroxine (SYNTHROID) 112 MCG tablet TAKE 1 TABLET(112 MCG) BY MOUTH DAILY 04/20/19   Cirigliano, Garvin Fila, DO  Multiple Vitamins-Minerals (PRESERVISION AREDS 2) CAPS Take 1 capsule by mouth 2 (two) times daily.    [provider]  rivastigmine (EXELON) 13.3 MG/24HR APPLY 1 PATCH TO SKIN QAM 09/04/18   [provider]  TURMERIC CURCUMIN PO Take by mouth.    [provider]  Vitamin D, Ergocalciferol, (DRISDOL) 50000 units CAPS capsule TAKE 1 CAPSULE BY MOUTH EVERY 7 DAYS 01/20/18   Lyndal Pulley, DO    Allergies    Patient has no known allergies.  Review of Systems   Review of Systems  Constitutional: Negative for fever.  Respiratory: Positive for cough and shortness of breath.   Cardiovascular: Negative for chest pain and leg swelling.  Gastrointestinal: Negative for abdominal pain, nausea and vomiting.  Genitourinary: Negative for dysuria.  All other systems reviewed and are negative.   Physical Exam Updated Vital Signs BP (!) 110/50   Pulse (!) 108   Temp 99.7 F (37.6 C) (Oral)   Resp (!) 22   SpO2 95%   Physical Exam Vitals and nursing note reviewed.  Constitutional:      Comments: Elderly, overweight, ill-appearing but nontoxic  HENT:     Head: Normocephalic and atraumatic.  Eyes:     Pupils: Pupils are equal, round, and reactive to light.  Cardiovascular:     Rate and Rhythm: Regular rhythm. Tachycardia present.     Heart sounds: Normal heart sounds.  Pulmonary:     Effort: Pulmonary effort is normal. Tachypnea present. No  respiratory distress.     Breath sounds: No wheezing.     Comments: Nonrebreather in place, crackles and rails noted on exam Abdominal:     General:  Bowel sounds are normal.     Palpations: Abdomen is soft.  Musculoskeletal:     Cervical back: Neck supple.     Right lower leg: Edema present.     Left lower leg: Edema present.     Comments: 1+ symmetric bilateral lower extremity edema  Skin:    General: Skin is warm and dry.  Neurological:     General: No focal deficit present.     Mental Status: She is alert.  Psychiatric:        Mood and Affect: Mood normal.     ED Results / Procedures / Treatments   Labs (all labs ordered are listed, but only abnormal results are displayed) Labs Reviewed  CBC WITH DIFFERENTIAL/PLATELET - Abnormal; Notable for the following components:      Result Value   WBC 20.5 (*)    RBC 3.65 (*)    Neutro Abs 16.5 (*)    Monocytes Absolute 1.8 (*)    Abs Immature Granulocytes 0.09 (*)    All other components within normal limits  BRAIN NATRIURETIC PEPTIDE - Abnormal; Notable for the following components:   B Natriuretic Peptide 148.2 (*)    All other components within normal limits  BASIC METABOLIC PANEL - Abnormal; Notable for the following components:   Glucose, Bld 136 (*)    GFR calc non Af Amer 60 (*)    All other components within normal limits  APTT - Abnormal; Notable for the following components:   aPTT 39 (*)    All other components within normal limits  SARS CORONAVIRUS 2 AG (30 MIN TAT)  CULTURE, BLOOD (ROUTINE X 2)  CULTURE, BLOOD (ROUTINE X 2)  URINE CULTURE  SARS CORONAVIRUS 2 (TAT 6-24 HRS)  LACTIC ACID, PLASMA  PROTIME-INR  LACTIC ACID, PLASMA  URINALYSIS, ROUTINE W REFLEX MICROSCOPIC    EKG EKG Interpretation  Date/Time:  Sunday November 01 2019 22:55:11 EDT Ventricular Rate:  120 PR Interval:    QRS Duration: 73 QT Interval:  288 QTC Calculation: 407 R Axis:   -50 Text Interpretation: Sinus tachycardia Paired  ventricular premature complexes Poor data quality, interpretation may be adversely affected Confirmed by Thayer Jew [phone]) on 11/01/2019 11:17:05 PM   Radiology DG Chest Portable 1 View  Result Date: 11/01/2019 CLINICAL DATA:  Shortness of breath. Progressive shortness of breath today. Respiratory difficulty. EXAM: PORTABLE CHEST 1 VIEW COMPARISON:  11/23/2013 FINDINGS: The cardiomediastinal contours are normal. Mild bronchial and slightly interstitial thickening. No consolidation, pleural effusion, or pneumothorax. No acute osseous abnormalities are seen. IMPRESSION: Mild bronchial and interstitial thickening, may reflect bronchitis or pulmonary edema. Bronchitis is favored given normal heart size. Electronically Signed   By: Keith Rake M.D.   On: 11/01/2019 23:30    Procedures Procedures (including critical care time)   CRITICAL CARE Performed by: Merryl Hacker   Total critical care time: 35 minutes  Critical care time was exclusive of separately billable procedures and treating other patients.  Critical care was necessary to treat or prevent imminent or life-threatening deterioration.  Critical care was time spent personally by me on the following activities: development of treatment plan with patient and/or surrogate as well as nursing, discussions with consultants, evaluation of patient's response to treatment, examination of patient, obtaining history from patient or surrogate, ordering and performing treatments and interventions, ordering and review of laboratory studies, ordering and review of radiographic studies, pulse oximetry and re-evaluation of patient's condition.  Medications Ordered in ED Medications  0.9 %  sodium chloride infusion (has no administration in time range)  Ampicillin-Sulbactam (UNASYN) 3 g in sodium chloride 0.9 % 100 mL IVPB ( Intravenous Stopped 11/01/19 2357)    ED Course  I have reviewed the triage vital signs and the nursing  notes.  Pertinent labs & imaging results that were available during my care of the patient were reviewed by me and considered in my medical decision making (see chart for details).    MDM Rules/Calculators/A&P                       Patient presents with cough and shortness of breath.  Noted to be hypoxic.  She is not in any respiratory distress but is on a nonrebreather with O2 sats 100%.  She has crackles and rails.  Mildly tachycardic but no evidence of ischemia on EKG.  Considerations include but not limited to CHF, pneumonia, aspiration pneumonia.  Patient noted to have a temperature of 100.9 upon arrival.  Sepsis work-up initiated.  Lactate 1.9 and patient is not hypotensive.  She does not appear to be in septic shock.  Lab work notable for white count of 20.5.  Otherwise BMP without significant metabolic derangement.  Chest x-ray shows just changes of bronchial and interstitial thickening.  Given clinical scenario, highly suspicious for pneumonia and likely aspiration pneumonia.  For this reason, patient was given Unasyn.  She was titrated down to 6 L nasal cannula.  We will plan for admission to the stepdown unit at [facility].    Final Clinical Impression(s) / ED Diagnoses Final diagnoses:  Community acquired pneumonia, unspecified laterality  Hypoxia    Rx / DC Orders ED Discharge Orders    None       Viet Kemmerer, Barbette Hair, MD 11/02/19 0110

## 2019-11-01 NOTE — ED Triage Notes (Addendum)
Pt. Arrived via [facility]. Reports pt has been sob all day. Had increased yellow sputum today. Hx of CHF. EMS reports 02 sats 83% Pt was initially placed on nasal cannula and then on a Non Rebreather mask. States she did get choked on a pill yesterday. Pt presents with labored breathing on Non rebreather mask.  Pt with audible gurgling on arrival. MD in room on arrival.

## 2019-11-02 ENCOUNTER — Encounter (HOSPITAL_COMMUNITY): Payer: Self-pay | Admitting: Internal Medicine

## 2019-11-02 ENCOUNTER — Inpatient Hospital Stay (HOSPITAL_COMMUNITY): Payer: Medicare Other

## 2019-11-02 DIAGNOSIS — J9600 Acute respiratory failure, unspecified whether with hypoxia or hypercapnia: Secondary | ICD-10-CM | POA: Diagnosis present

## 2019-11-02 DIAGNOSIS — J690 Pneumonitis due to inhalation of food and vomit: Secondary | ICD-10-CM | POA: Diagnosis present

## 2019-11-02 DIAGNOSIS — D539 Nutritional anemia, unspecified: Secondary | ICD-10-CM | POA: Diagnosis present

## 2019-11-02 DIAGNOSIS — J9601 Acute respiratory failure with hypoxia: Secondary | ICD-10-CM

## 2019-11-02 DIAGNOSIS — Z20822 Contact with and (suspected) exposure to covid-19: Secondary | ICD-10-CM | POA: Diagnosis present

## 2019-11-02 DIAGNOSIS — Z79890 Hormone replacement therapy (postmenopausal): Secondary | ICD-10-CM | POA: Diagnosis not present

## 2019-11-02 DIAGNOSIS — Z66 Do not resuscitate: Secondary | ICD-10-CM | POA: Diagnosis present

## 2019-11-02 DIAGNOSIS — Z6834 Body mass index (BMI) 34.0-34.9, adult: Secondary | ICD-10-CM | POA: Diagnosis not present

## 2019-11-02 DIAGNOSIS — R1310 Dysphagia, unspecified: Secondary | ICD-10-CM | POA: Diagnosis present

## 2019-11-02 DIAGNOSIS — R0602 Shortness of breath: Secondary | ICD-10-CM | POA: Diagnosis not present

## 2019-11-02 DIAGNOSIS — Z79899 Other long term (current) drug therapy: Secondary | ICD-10-CM | POA: Diagnosis not present

## 2019-11-02 DIAGNOSIS — E039 Hypothyroidism, unspecified: Secondary | ICD-10-CM | POA: Diagnosis not present

## 2019-11-02 DIAGNOSIS — Z803 Family history of malignant neoplasm of breast: Secondary | ICD-10-CM | POA: Diagnosis not present

## 2019-11-02 DIAGNOSIS — E785 Hyperlipidemia, unspecified: Secondary | ICD-10-CM | POA: Diagnosis present

## 2019-11-02 DIAGNOSIS — F0390 Unspecified dementia without behavioral disturbance: Secondary | ICD-10-CM | POA: Diagnosis present

## 2019-11-02 DIAGNOSIS — G4733 Obstructive sleep apnea (adult) (pediatric): Secondary | ICD-10-CM | POA: Diagnosis present

## 2019-11-02 DIAGNOSIS — E669 Obesity, unspecified: Secondary | ICD-10-CM | POA: Diagnosis present

## 2019-11-02 DIAGNOSIS — A419 Sepsis, unspecified organism: Secondary | ICD-10-CM | POA: Diagnosis present

## 2019-11-02 DIAGNOSIS — F3130 Bipolar disorder, current episode depressed, mild or moderate severity, unspecified: Secondary | ICD-10-CM | POA: Diagnosis present

## 2019-11-02 DIAGNOSIS — F319 Bipolar disorder, unspecified: Secondary | ICD-10-CM | POA: Diagnosis not present

## 2019-11-02 DIAGNOSIS — Z8249 Family history of ischemic heart disease and other diseases of the circulatory system: Secondary | ICD-10-CM | POA: Diagnosis not present

## 2019-11-02 DIAGNOSIS — Z87891 Personal history of nicotine dependence: Secondary | ICD-10-CM | POA: Diagnosis not present

## 2019-11-02 DIAGNOSIS — Z833 Family history of diabetes mellitus: Secondary | ICD-10-CM | POA: Diagnosis not present

## 2019-11-02 LAB — COMPREHENSIVE METABOLIC PANEL
ALT: 22 U/L (ref 0–44)
AST: 33 U/L (ref 15–41)
Albumin: 3 g/dL — ABNORMAL LOW (ref 3.5–5.0)
Alkaline Phosphatase: 48 U/L (ref 38–126)
Anion gap: 10 (ref 5–15)
BUN: 19 mg/dL (ref 8–23)
CO2: 26 mmol/L (ref 22–32)
Calcium: 9.1 mg/dL (ref 8.9–10.3)
Chloride: 101 mmol/L (ref 98–111)
Creatinine, Ser: 0.83 mg/dL (ref 0.44–1.00)
GFR calc Af Amer: >60 mL/min (ref >60)
GFR calc non Af Amer: >60 mL/min (ref >60)
Glucose, Bld: 151 mg/dL — ABNORMAL HIGH (ref 70–99)
Potassium: 4.2 mmol/L (ref 3.5–5.1)
Sodium: 137 mmol/L (ref 135–145)
Total Bilirubin: 0.8 mg/dL (ref 0.3–1.2)
Total Protein: 6.4 g/dL — ABNORMAL LOW (ref 6.5–8.1)

## 2019-11-02 LAB — CBC WITH DIFFERENTIAL/PLATELET
Abs Immature Granulocytes: 0.4 10*3/uL — ABNORMAL HIGH (ref 0.00–0.07)
Band Neutrophils: 32 %
Basophils Absolute: 0 10*3/uL (ref 0.0–0.1)
Basophils Relative: 0 %
Eosinophils Absolute: 0 10*3/uL (ref 0.0–0.5)
Eosinophils Relative: 0 %
HCT: 37.1 % (ref 36.0–46.0)
Hemoglobin: 11.8 g/dL — ABNORMAL LOW (ref 12.0–15.0)
Lymphocytes Relative: 7 %
Lymphs Abs: 0.9 10*3/uL (ref 0.7–4.0)
MCH: 32.7 pg (ref 26.0–34.0)
MCHC: 31.8 g/dL (ref 30.0–36.0)
MCV: 102.8 fL — ABNORMAL HIGH (ref 80.0–100.0)
Metamyelocytes Relative: 1 %
Monocytes Absolute: 0.6 10*3/uL (ref 0.1–1.0)
Monocytes Relative: 5 %
Myelocytes: 2 %
Neutro Abs: 10.9 10*3/uL — ABNORMAL HIGH (ref 1.7–7.7)
Neutrophils Relative %: 53 %
Platelets: 183 10*3/uL (ref 150–400)
RBC: 3.61 MIL/uL — ABNORMAL LOW (ref 3.87–5.11)
RDW: 13.2 % (ref 11.5–15.5)
WBC: 12.8 10*3/uL — ABNORMAL HIGH (ref 4.0–10.5)
nRBC: 0 % (ref 0.0–0.2)

## 2019-11-02 LAB — URINALYSIS, ROUTINE W REFLEX MICROSCOPIC
Bilirubin Urine: NEGATIVE
Glucose, UA: NEGATIVE mg/dL
Hgb urine dipstick: NEGATIVE
Ketones, ur: 15 mg/dL — AB
Leukocytes,Ua: NEGATIVE
Nitrite: NEGATIVE
Protein, ur: NEGATIVE mg/dL
Specific Gravity, Urine: 1.02 (ref 1.005–1.030)
pH: 6.5 (ref 5.0–8.0)

## 2019-11-02 LAB — GLUCOSE, CAPILLARY
Glucose-Capillary: 105 mg/dL — ABNORMAL HIGH (ref 70–99)
Glucose-Capillary: 111 mg/dL — ABNORMAL HIGH (ref 70–99)
Glucose-Capillary: 131 mg/dL — ABNORMAL HIGH (ref 70–99)

## 2019-11-02 LAB — D-DIMER, QUANTITATIVE: D-Dimer, Quant: 2.85 ug/mL-FEU — ABNORMAL HIGH (ref 0.00–0.50)

## 2019-11-02 LAB — BRAIN NATRIURETIC PEPTIDE
B Natriuretic Peptide: 148.2 pg/mL — ABNORMAL HIGH (ref 0.0–100.0)
B Natriuretic Peptide: 202.2 pg/mL — ABNORMAL HIGH (ref 0.0–100.0)

## 2019-11-02 LAB — SARS CORONAVIRUS 2 (TAT 6-24 HRS): SARS Coronavirus 2: NEGATIVE

## 2019-11-02 LAB — MRSA PCR SCREENING: MRSA by PCR: NEGATIVE

## 2019-11-02 LAB — TSH: TSH: 1.683 u[IU]/mL (ref 0.350–4.500)

## 2019-11-02 LAB — VALPROIC ACID LEVEL: Valproic Acid Lvl: 38 ug/mL — ABNORMAL LOW (ref 50.0–100.0)

## 2019-11-02 LAB — PROCALCITONIN: Procalcitonin: 15.33 ng/mL

## 2019-11-02 LAB — TROPONIN I (HIGH SENSITIVITY): Troponin I (High Sensitivity): 11 ng/L (ref <18)

## 2019-11-02 MED ORDER — ONDANSETRON HCL 4 MG PO TABS: 4.0000 mg | ORAL_TABLET | Freq: Four times a day (QID) | ORAL | Status: DC | PRN

## 2019-11-02 MED ORDER — SODIUM CHLORIDE 0.9 % IV SOLN
3.0000 g | Freq: Four times a day (QID) | INTRAVENOUS | Status: DC
  Administered 2019-11-02 – 2019-11-05 (×13): 3 g via INTRAVENOUS
  Filled 2019-11-02: qty 3
  Filled 2019-11-02: qty 8
  Filled 2019-11-02 (×3): qty 3
  Filled 2019-11-02 (×3): qty 8
  Filled 2019-11-02 (×2): qty 3
  Filled 2019-11-02: qty 8
  Filled 2019-11-02 (×2): qty 3
  Filled 2019-11-02 (×2): qty 8

## 2019-11-02 MED ORDER — ATORVASTATIN CALCIUM 10 MG PO TABS
10.0000 mg | ORAL_TABLET | Freq: Every day | ORAL | Status: DC
  Administered 2019-11-02 – 2019-11-04 (×3): 10 mg via ORAL
  Filled 2019-11-02 (×3): qty 1

## 2019-11-02 MED ORDER — DIVALPROEX SODIUM ER 500 MG PO TB24
1250.0000 mg | ORAL_TABLET | Freq: Every evening | ORAL | Status: DC
  Administered 2019-11-02 – 2019-11-04 (×3): 1250 mg via ORAL
  Filled 2019-11-02 (×2): qty 2
  Filled 2019-11-02: qty 5

## 2019-11-02 MED ORDER — PRESERVISION AREDS 2 PO CAPS: 1.0000 | ORAL_CAPSULE | Freq: Two times a day (BID) | ORAL | Status: DC

## 2019-11-02 MED ORDER — RIVASTIGMINE 13.3 MG/24HR TD PT24
13.3000 mg | MEDICATED_PATCH | Freq: Every day | TRANSDERMAL | Status: DC
  Administered 2019-11-02 – 2019-11-05 (×4): 13.3 mg via TRANSDERMAL
  Filled 2019-11-02 (×4): qty 1

## 2019-11-02 MED ORDER — ORAL CARE MOUTH RINSE
15.0000 mL | Freq: Two times a day (BID) | OROMUCOSAL | Status: DC
  Administered 2019-11-02 – 2019-11-05 (×6): 15 mL via OROMUCOSAL

## 2019-11-02 MED ORDER — ONDANSETRON HCL 4 MG/2ML IJ SOLN: 4.0000 mg | Freq: Four times a day (QID) | INTRAMUSCULAR | Status: DC | PRN

## 2019-11-02 MED ORDER — ENOXAPARIN SODIUM 40 MG/0.4ML ~~LOC~~ SOLN
40.0000 mg | SUBCUTANEOUS | Status: DC
  Administered 2019-11-02 – 2019-11-05 (×4): 40 mg via SUBCUTANEOUS
  Filled 2019-11-02 (×4): qty 0.4

## 2019-11-02 MED ORDER — ACETAMINOPHEN 650 MG RE SUPP: 650.0000 mg | Freq: Four times a day (QID) | RECTAL | Status: DC | PRN

## 2019-11-02 MED ORDER — PROSIGHT PO TABS
1.0000 | ORAL_TABLET | Freq: Every day | ORAL | Status: DC
  Administered 2019-11-02 – 2019-11-05 (×4): 1 via ORAL
  Filled 2019-11-02 (×4): qty 1

## 2019-11-02 MED ORDER — SODIUM CHLORIDE 0.9 % IV SOLN: INTRAVENOUS | Status: DC | PRN

## 2019-11-02 MED ORDER — IOHEXOL 350 MG/ML SOLN
100.0000 mL | Freq: Once | INTRAVENOUS | Status: AC | PRN
  Administered 2019-11-02: 100 mL via INTRAVENOUS

## 2019-11-02 MED ORDER — ACETAMINOPHEN 325 MG PO TABS: 650.0000 mg | ORAL_TABLET | Freq: Four times a day (QID) | ORAL | Status: DC | PRN

## 2019-11-02 MED ORDER — CHLORHEXIDINE GLUCONATE CLOTH 2 % EX PADS
6.0000 | MEDICATED_PAD | Freq: Every day | CUTANEOUS | Status: DC
  Administered 2019-11-02 – 2019-11-05 (×3): 6 via TOPICAL

## 2019-11-02 MED ORDER — LEVOTHYROXINE SODIUM 112 MCG PO TABS
112.0000 ug | ORAL_TABLET | Freq: Every day | ORAL | Status: DC
  Administered 2019-11-03 – 2019-11-05 (×3): 112 ug via ORAL
  Filled 2019-11-02 (×3): qty 1

## 2019-11-02 MED ORDER — VITAMIN D (ERGOCALCIFEROL) 1.25 MG (50000 UNIT) PO CAPS
50000.0000 [IU] | ORAL_CAPSULE | ORAL | Status: DC
  Administered 2019-11-02: 50000 [IU] via ORAL
  Filled 2019-11-02: qty 1

## 2019-11-02 MED ORDER — SODIUM CHLORIDE 0.9 % IV SOLN: INTRAVENOUS | Status: AC

## 2019-11-02 MED ORDER — SODIUM CHLORIDE (PF) 0.9 % IJ SOLN
INTRAMUSCULAR | Status: AC
  Filled 2019-11-02: qty 50

## 2019-11-02 NOTE — Progress Notes (Signed)
Removed patient from 100% NRB at 15 liters and place her on a 6 liter nasal cannula with humidity.  Patient's SPO2 remains between 95% and 97%, HR is 109 and RR 25.

## 2019-11-02 NOTE — Telephone Encounter (Signed)
Last OV 01/829/21 Last fill 04/20/19  #90/1

## 2019-11-02 NOTE — Progress Notes (Signed)
Pharmacy Antibiotic Note  DORATHEA KIBLER is a 85 y.o. female admitted on 11/01/2019 with pneumonia.  Pharmacy has been consulted for Unasyn dosing.  Plan: Unasyn 3gm iv q6hr  Height: 5\' 2"  (157.5 cm) Weight: 188 lb 11.4 oz (85.6 kg) IBW/kg (Calculated) : 50.1  Temp (24hrs), Avg:99.7 F (37.6 C), Min:98.5 F (36.9 C), Max:100.9 F (38.3 C)  Recent Labs  Lab 11/01/19 2303  WBC 20.5*  CREATININE 0.90  LATICACIDVEN 1.9    Estimated Creatinine Clearance: 48.9 mL/min (by C-G formula based on SCr of 0.9 mg/dL).    No Known Allergies  Antimicrobials this admission: Unasyn 11/02/2019 >>  Dose adjustments this admission: -  Microbiology results: -  Thank you for allowing pharmacy to be a part of this patient's care.  Nani Skillern Crowford 11/02/2019 5:45 AM

## 2019-11-02 NOTE — ED Notes (Signed)
Pt's husband has been updated by this RN twice during the visit.  Aware that she will be admitted and I will update him when we have a room number/location of admission.

## 2019-11-02 NOTE — Evaluation (Signed)
Clinical/Bedside Swallow Evaluation Patient Details  Name: Emily Castaneda MRN: [id number] Date of Birth: [birth year]  Today's Date: 11/02/2019 Time: SLP Start Time (ACUTE ONLY): 1116 SLP Stop Time (ACUTE ONLY): 1155 SLP Time Calculation (min) (ACUTE ONLY): 39 min  Past Medical History:  Past Medical History:  Diagnosis Date  . Depression    Dr. Abner Greenspan  . Diverticulosis of colon   . Endometriosis   . Hx of colonic polyps   . Hyperlipidemia   . Hypothyroidism   . Manic, depressive ([place])   . Menopausal syndrome   . OSA (obstructive sleep apnea)    cpap; 17 cm (clint young)   Past Surgical History:  Past Surgical History:  Procedure Laterality Date  . APPENDECTOMY    . COLONOSCOPY    . POLYPECTOMY     Dr. Ubaldo Glassing  . TONSILLECTOMY AND ADENOIDECTOMY     HPI:  85 y.o. female with history of dementia, hypothyroidism, bipolar disorder with progressive shortness of breath since yesterday morning.  Per pt's husband report to MD, the patient had a lot of difficulty taking her night medications due to difficulty swallowing.  ER.  Per MD note, in the ER patient was hypoxic requiring 6 L oxygen with a temperature of 100.74F Covid PCR is pending.  Chest x-ray shows infiltrates concerning for either pulmonary edema versus bronchitis. leukocytosis of 20.5 EKG shows sinus tachycardia with RBBB.  Being treated for possible aspiration pna.  Pt has h/o smoking.Initial Corona Virus test negative. Swallow evaluation ordered.   Assessment / Plan / Recommendation Clinical Impression  Pt currently presents with baseline cough that was also noted during po intake x3.  CN exam revealed slight lingual deviation to the left upon phonation.  Initially voice was gurgly after swallows of few single ice chips but cleared with cues to clear her throat.  Suspect secretion retention in pharynx.  Pt did NOT pass 3 ounce Yale water test as she was only able to consume a few single sips at a time.  Self feeding achieved  with set up.  Oral deficits presumed due to cognitive status resulting in clinical oral delay in swallow.  Self feeding faciliated pt's swallowing safety.  Subtle cough noted x3 during entire session that did not appear coorelated to po intake.  Given pt's oral delays - recommend dys3/thin diet with intermittent supervision/set up assistance. SLP will follow up next date - to assess po tolerance/indication for instrumental testing.  Suspect pt may have "choked" on her pill due to mentation and/or difficulty with managing mixed consistency.  Educated pt to recommendations. SLP Visit Diagnosis: Dysphagia, oral phase (R13.11)    Aspiration Risk    Moderate   Diet Recommendation Dysphagia 3 (Mech soft);Thin liquid   Liquid Administration via: Cup;Straw Medication Administration: Whole meds with puree Supervision: Patient able to self feed;Intermittent supervision to cue for compensatory strategies Compensations: Slow rate;Small sips/bites Postural Changes: Seated upright at 90 degrees;Remain upright for at least 30 minutes after po intake    Other  Recommendations Oral Care Recommendations: Oral care BID   Follow up Recommendations        Frequency and Duration min 1 x/week  1 week       Prognosis Barriers to Reach Goals: Cognitive deficits      Swallow Study   General HPI: 85 y.o. female with history of dementia, hypothyroidism, bipolar disorder with progressive shortness of breath since yesterday morning.  Per pt's husband report to MD, the patient had a lot of difficulty taking  her night medications due to difficulty swallowing.  ER.  Per MD note, in the ER patient was hypoxic requiring 6 L oxygen with a temperature of 100.65F Covid PCR is pending.  Chest x-ray shows infiltrates concerning for either pulmonary edema versus bronchitis. leukocytosis of 20.5 EKG shows sinus tachycardia with RBBB.  Being treated for possible aspiration pna.  Pt has h/o smoking.Initial Corona Virus test  negative. Swallow evaluation ordered. Type of Study: Bedside Swallow Evaluation Previous Swallow Assessment: none in system Diet Prior to this Study: NPO Temperature Spikes Noted: No Respiratory Status: Nasal cannula History of Recent Intubation: No Behavior/Cognition: Alert;Cooperative;Pleasant mood Oral Cavity Assessment: Within Functional Limits Oral Care Completed by SLP: No Oral Cavity - Dentition: Adequate natural dentition Vision: Functional for self-feeding Self-Feeding Abilities: Able to feed self Patient Positioning: Upright in bed Baseline Vocal Quality: Hoarse Volitional Cough: Strong    Oral/Motor/Sensory Function Overall Oral Motor/Sensory Function: (? minimal lingual deviation to the left upon lingual protrusion)   Ice Chips Ice chips: Within functional limits Presentation: Cup   Thin Liquid Thin Liquid: Impaired Presentation: Cup;Self Fed;Straw Pharyngeal  Phase Impairments: Suspected delayed Swallow    Nectar Thick Nectar Thick Liquid: Impaired Presentation: Cup;Self Fed Pharyngeal Phase Impairments: Suspected delayed Swallow   Honey Thick Honey Thick Liquid: Not tested   Puree Puree: Within functional limits Presentation: Self Fed;Spoon   Solid     Solid: Within functional limits Presentation: Self Fredirick Lathe 11/02/2019,12:20 PM    Kathleen Lime, MS [place] Office [phone]

## 2019-11-02 NOTE — Progress Notes (Addendum)
PROGRESS NOTE   Emily Castaneda  MRN:[id number]    DOB: 03/16/1935    DOA: 11/01/2019  PCP: Cirigliano, Mary K, DO   I have briefly reviewed patients previous medical records in  Link.  Chief Complaint:   Chief Complaint  Patient presents with  . Shortness of Breath    Brief Narrative:  84-year-old married female with PMH of dementia, depression/manic depression, hyperlipidemia, hypothyroidism, OSA on CPAP, presented to [facility] via [facility] [place] [facility] ED due to progressive dyspnea and productive cough from morning of 3/14 in the context of difficulty swallowing her night medications the night prior.  In the ED she was hypoxic requiring 6 L/min oxygen, temperature 100.9 F.  She was admitted for sepsis and acute hypoxic respiratory failure due to suspected aspiration pneumonia from dysphagia.  Admitted to stepdown unit.  POC COVID-19 negative, confirmatory testing pending.   Assessment & Plan:  Principal Problem:   Acute respiratory failure (HCC) Active Problems:   Hypothyroidism   Bipolar 1 disorder, depressed (HCC)   Aspiration pneumonia (HCC)   Sepsis and acute respiratory failure with hypoxia due to suspected aspiration pneumonia from dysphagia: Sepsis present on admission.  EMS noted oxygen saturation in the low 80s.  Patient reported to EDP that her symptoms started after taking a pill which she stated "it went down the wrong way".  Met sepsis criteria on admission (temperature 100.9, respiratory rate 24, pulse rate in the 110s and hypoxic with oxygen saturation of 100% on NRB).  Temperature peaked to 101.1 F this morning.  Still mildly tachypneic and ongoing tachycardia, physiological due to fever.  Weaned down to HFNC 6 L/min.  BNP 148, lactate normal, procalcitonin 15.33, leukocytosis (20.5 > 12.8).  Chest x-ray shows mild bronchial and interstitial thickening which may reflect bronchitis or pulmonary edema but these findings are out of proportion to her  hypoxia on presentation.  Given elevated D-dimer of 2.85, will obtain CTA chest to rule out acute PE.  POC COVID-19 testing negative but confirmatory test pending.  Blood and urine culture results pending.  Urine microscopy not suggestive of UTI.  Will place on gentle IV fluids, avoiding high volume resuscitation due to some pedal edema and concern for precipitating volume overload.  Continue empirically started IV Unasyn.  ST consulted for swallow evaluation.  Addendum: CTA chest results reviewed: No evidence of PE.  Consolidation of left lower lobe and additional patchy infiltrates in the left upper lobe favoring multifocal pneumonia.  Additional focus of questionable infiltrate versus groundglass opacity right upper lobe 9 x 6 mm >short-term follow-up CT recommended in 3 months.  Dysphagia: Speech therapy input appreciated, dysphagia 3 diet and thin liquids initiated.  Macrocytic anemia: Unclear etiology.  Follow CBC daily.  Leukocytosis: Secondary to sepsis.  Follow daily CBC.  Hypothyroidism: TSH normal.  Resume home dose of Synthroid after speech therapy recommends diet.  Dementia/bipolar depression: Continue Exelon patch.  Depakote level low.  Continue home dose of Depakote.  Delirium precautions.  Minimize opioids and sedatives.  Hyperlipidemia: Statins.  Body mass index is 34.52 kg/m./Obesity  DVT prophylaxis: Lovenox Code Status: DNR Family Communication: None at bedside.  I discussed in detail with patient spouse via phone, updated care and answered questions.  He was agreeable with patient to proceed with a CT scan.  He indicated "please do all that you have to do to get her better". Disposition:  . Patient came from: Home           .   Anticipated d/c place: Home . Barriers to d/c: Less than 24 hours into admission for sepsis with ongoing fevers, tachycardia and hypoxia.   Consultants:   None  Procedures:   None  Antimicrobials:   IV Unasyn 3/15 >   Subjective:    Patient seen this morning.  Alert and oriented to self and place.  Not oriented to time.  States that she feels "fine".  Currently denies dyspnea or pain.  As per RN, no acute issues noted.  Objective:   Vitals:   11/02/19 0900 11/02/19 1000 11/02/19 1100 11/02/19 1144  BP: (!) 155/46 (!) 129/43 131/66   Pulse: (!) 133 (!) 118 (!) 126   Resp: (!) 29 (!) 25 (!) 29   Temp:    98.3 F (36.8 C)  TempSrc:    Oral  SpO2: 92% (!) 89% 93%   Weight:      Height:        General exam: Pleasant elderly female, moderately built and obese lying comfortably propped up in bed without distress.  Appears to be in good spirits. Respiratory system: Diminished breath sounds in the bases with occasional basal crackles.  No wheezing or rhonchi.  Rest of lung fields clear to auscultation.  No increased work of breathing.  Able to speak in full sentences. Cardiovascular system: S1 & S2 heard, regular mild tachycardia. No JVD, murmurs, rubs, gallops or clicks.  Trace bilateral leg edema.  Telemetry personally reviewed: Sinus tachycardia ranging between 110s to 130s at times. Gastrointestinal system: Abdomen is nondistended, soft and nontender. No organomegaly or masses felt. Normal bowel sounds heard. Central nervous system: Alert and oriented x2. No focal neurological deficits. Extremities: Symmetric 5 x 5 power. Skin: No rashes, lesions or ulcers Psychiatry: Judgement and insight appear somewhat impaired. Mood & affect appropriate.     Data Reviewed:   I have personally reviewed following labs and imaging studies   CBC: Recent Labs  Lab 11/01/19 2303 11/02/19 0611  WBC 20.5* 12.8*  NEUTROABS 16.5* 10.9*  HGB 12.2 11.8*  HCT 36.4 37.1  MCV 99.7 102.8*  PLT 196 183    Basic Metabolic Panel: Recent Labs  Lab 11/01/19 2303 11/02/19 0611  NA 139 137  K 4.0 4.2  CL 104 101  CO2 24 26  GLUCOSE 136* 151*  BUN 22 19  CREATININE 0.90 0.83  CALCIUM 9.3 9.1    Liver Function Tests: Recent  Labs  Lab 11/02/19 0611  AST 33  ALT 22  ALKPHOS 48  BILITOT 0.8  PROT 6.4*  ALBUMIN 3.0*    CBG: Recent Labs  Lab 11/02/19 0845  GLUCAP 131*    Microbiology Studies:   Recent Results (from the past 240 hour(s))  SARS Coronavirus 2 Ag (30 min TAT) - Nasal Swab (BD Veritor Kit)     Status: None   Collection Time: 11/01/19 11:03 PM   Specimen: Nasal Swab (BD Veritor Kit)  Result Value Ref Range Status   SARS Coronavirus 2 Ag NEGATIVE NEGATIVE Final    Comment: (NOTE) SARS-CoV-2 antigen NOT DETECTED.  Negative results are presumptive.  Negative results do not preclude SARS-CoV-2 infection and should not be used as the sole basis for treatment or other patient management decisions, including infection  control decisions, particularly in the presence of clinical signs and  symptoms consistent with COVID-19, or in those who have been in contact with the virus.  Negative results must be combined with clinical observations, patient history, and epidemiological information. The expected result is   Negative. Fact Sheet for Patients: https://www.fda.gov/media/139754/download Fact Sheet for Healthcare Providers: https://www.fda.gov/media/139753/download This test is not yet approved or cleared by the [place] FDA and  has been authorized for detection and/or diagnosis of SARS-CoV-2 by FDA under an Emergency Use Authorization (EUA).  This EUA will remain in effect (meaning this test can be used) for the duration of  the COVID-19 de claration under Section 564(b)(1) of the Act, 21 U.S.C. section 360bbb-3(b)(1), unless the authorization is terminated or revoked sooner. Performed at [facility] [place] [facility], [address] [place], [place] [address]   MRSA PCR Screening     Status: None   Collection Time: 11/02/19  4:16 AM   Specimen: Nasopharyngeal  Result Value Ref Range Status   MRSA by PCR NEGATIVE NEGATIVE Final    Comment:        The GeneXpert MRSA Assay  (FDA approved for NASAL specimens only), is one component of a comprehensive MRSA colonization surveillance program. It is not intended to diagnose MRSA infection nor to guide or monitor treatment for MRSA infections. Performed at [facility], [address] [place], [place] [address]      Radiology Studies:  DG Chest Portable 1 View  Result Date: 11/01/2019 CLINICAL DATA:  Shortness of breath. Progressive shortness of breath today. Respiratory difficulty. EXAM: PORTABLE CHEST 1 VIEW COMPARISON:  11/23/2013 FINDINGS: The cardiomediastinal contours are normal. Mild bronchial and slightly interstitial thickening. No consolidation, pleural effusion, or pneumothorax. No acute osseous abnormalities are seen. IMPRESSION: Mild bronchial and interstitial thickening, may reflect bronchitis or pulmonary edema. Bronchitis is favored given normal heart size. Electronically Signed   By: Melanie  Sanford M.D.   On: 11/01/2019 23:30     Scheduled Meds:   . Chlorhexidine Gluconate Cloth  6 each Topical Daily  . enoxaparin (LOVENOX) injection  40 mg Subcutaneous Q24H  . mouth rinse  15 mL Mouth Rinse BID  . rivastigmine  13.3 mg Transdermal Daily    Continuous Infusions:   . sodium chloride    . ampicillin-sulbactam (UNASYN) IV 3 g (11/02/19 1143)     LOS: 0 days      , MD, FACP, SFHM. [facility]    To contact the attending provider between 7A-7P or the covering provider during after hours 7P-7A, please log into the web site [email or web address] and access using universal [place] password for that web site. If you do not have the password, please call the hospital operator.  11/02/2019, 11:55 AM   

## 2019-11-02 NOTE — Progress Notes (Signed)
Patient's SPO2 dropped to 84% on 6 liter nasal cannula.  Placed patient on 100% NRB.  Patient's SPO2 increased to 93%.

## 2019-11-02 NOTE — Progress Notes (Signed)
Patient and her husband are unaware of the patient's CPAP settings.  Per office visit on 06/03/2017 patients setting was 17 cm H2O with nasal pillows.

## 2019-11-02 NOTE — H&P (Signed)
History and Physical    Emily Castaneda XAJ:[id number] DOB: 1934/09/03 DOA: 11/01/2019  PCP: Ronnald Nian, DO  Patient coming from: Home.  History obtained from patient's husband.  Patient has dementia.  Chief Complaint: Shortness of breath.  HPI: Emily Castaneda is a 85 y.o. female with history of dementia, hypothyroidism, bipolar disorder was brought to the ER at [facility] after patient became progressively short of breath since yesterday morning.  Per patient's husband the night before patient slept patient had a lot of difficulty taking her night medications due to difficulty swallowing.  Yesterday morning she woke up with shortness of breath and productive cough.  Given the worsening shortness of breath was brought to the ER.  Has not had any chest pain.  ED Course: In the ER patient was hypoxic requiring 6 L oxygen with a temperature of 100.69F Covid PCR is pending.  Chest x-ray shows infiltrates concerning for either pulmonary edema versus bronchitis.  Favor infection.  Lab work show leukocytosis of 20.5 lactic acid 1.9 metabolic panel unremarkable.  EKG shows sinus tachycardia with RBBB.  Patient had blood cultures drawn and started on Unasyn for possible aspiration admitted for further management.  Review of Systems: As per HPI, rest all negative.   Past Medical History:  Diagnosis Date  . Depression    Dr. Abner Greenspan  . Diverticulosis of colon   . Endometriosis   . Hx of colonic polyps   . Hyperlipidemia   . Hypothyroidism   . Manic, depressive ([place])   . Menopausal syndrome   . OSA (obstructive sleep apnea)    cpap; 17 cm (clint young)    Past Surgical History:  Procedure Laterality Date  . APPENDECTOMY    . COLONOSCOPY    . POLYPECTOMY     Dr. Ubaldo Glassing  . TONSILLECTOMY AND ADENOIDECTOMY       reports that she quit smoking about 36 years ago. Her smoking use included cigarettes. She has a 1.00 pack-year smoking history. She has never used smokeless  tobacco. She reports current alcohol use of about 1.0 - 2.0 standard drinks of alcohol per week. She reports that she does not use drugs.  No Known Allergies  Family History  Problem Relation Age of Onset  . Diabetes Mother   . Heart disease Father        complications of coronary heart disease  . Diabetes Brother   . Heart disease Other   . Cancer Other        BREAST AND LUNG   . Breast cancer Maternal Aunt   . Cancer Maternal Grandfather        LUNG  . Breast cancer Daughter   . Colon cancer Neg Hx   . Rectal cancer Neg Hx   . Stomach cancer Neg Hx     Prior to Admission medications   Medication Sig Start Date End Date Taking? Authorizing Provider  atorvastatin (LIPITOR) 10 MG tablet TAKE 1 TABLET BY MOUTH DAILY 07/22/19   Cirigliano, Garvin Fila, DO  Cholecalciferol (VITAMIN D3) 2000 units TABS Take 1 capsule by mouth daily.    [provider]  clonazePAM (KLONOPIN) 1 MG tablet Take 1 mg by mouth at bedtime.     [provider]  diphenoxylate-atropine (LOMOTIL) 2.5-0.025 MG tablet TAKE 2 TABLET BY MOUTH FOUR TIMES DAILY AS NEEDED FOR DIARRHEA OR LOOSE STOOL 10/02/18   Cirigliano, Mary K, DO  divalproex (DEPAKOTE ER) 250 MG 24 hr tablet Take 1,250 mg  by mouth every evening.    [provider]  eszopiclone (LUNESTA) 2 MG TABS Take 2 mg by mouth at bedtime as needed for sleep. Take immediately before bedtime    [provider]  levothyroxine (SYNTHROID) 112 MCG tablet TAKE 1 TABLET(112 MCG) BY MOUTH DAILY 04/20/19   Cirigliano, Garvin Fila, DO  Multiple Vitamins-Minerals (PRESERVISION AREDS 2) CAPS Take 1 capsule by mouth 2 (two) times daily.    [provider]  rivastigmine (EXELON) 13.3 MG/24HR APPLY 1 PATCH TO SKIN QAM 09/04/18   [provider]  TURMERIC CURCUMIN PO Take by mouth.    [provider]  Vitamin D, Ergocalciferol, (DRISDOL) 50000 units CAPS capsule TAKE 1 CAPSULE BY MOUTH EVERY 7 DAYS 01/20/18   Lyndal Pulley, DO     Physical Exam: Constitutional: Moderately built and nourished. Vitals:   11/02/19 0230 11/02/19 0300 11/02/19 0331 11/02/19 0443  BP: (!) 125/58 91/79 109/83 (!) 128/43  Pulse: (!) 110 (!) 115 (!) 116 (!) 125  Resp: (!) 31 (!) 28 20 (!) 32  Temp:    98.5 F (36.9 C)  TempSrc:    Axillary  SpO2: 96% 100% 97% 93%  Weight:    85.6 kg  Height:    '5\' 2"'$  (1.575 m)   Eyes: Anicteric no pallor. ENMT: No discharge from the ears eyes nose or mouth. Neck: No mass felt.  No neck rigidity. Respiratory: No rhonchi or crepitations. Cardiovascular: S1-S2 heard. Abdomen: Soft nontender bowel sound present. Musculoskeletal: No edema. Skin: No rash. Neurologic: Alert awake oriented to place and person.  Moves all extremities. Psychiatric: Oriented to place and person.   Labs on Admission: I have personally reviewed following labs and imaging studies  CBC: Recent Labs  Lab 11/01/19 2303  WBC 20.5*  NEUTROABS 16.5*  HGB 12.2  HCT 36.4  MCV 99.7  PLT 536   Basic Metabolic Panel: Recent Labs  Lab 11/01/19 2303  NA 139  K 4.0  CL 104  CO2 24  GLUCOSE 136*  BUN 22  CREATININE 0.90  CALCIUM 9.3   GFR: Estimated Creatinine Clearance: 48.9 mL/min (by C-G formula based on SCr of 0.9 mg/dL). Liver Function Tests: No results for input(s): AST, ALT, ALKPHOS, BILITOT, PROT, ALBUMIN in the last 168 hours. No results for input(s): LIPASE, AMYLASE in the last 168 hours. No results for input(s): AMMONIA in the last 168 hours. Coagulation Profile: Recent Labs  Lab 11/01/19 2303  INR 1.2   Cardiac Enzymes: No results for input(s): CKTOTAL, CKMB, CKMBINDEX, TROPONINI in the last 168 hours. BNP (last 3 results) No results for input(s): PROBNP in the last 8760 hours. HbA1C: No results for input(s): HGBA1C in the last 72 hours. CBG: No results for input(s): GLUCAP in the last 168 hours. Lipid Profile: No results for input(s): CHOL, HDL, LDLCALC, TRIG, CHOLHDL, LDLDIRECT in the  last 72 hours. Thyroid Function Tests: No results for input(s): TSH, T4TOTAL, FREET4, T3FREE, THYROIDAB in the last 72 hours. Anemia Panel: No results for input(s): VITAMINB12, FOLATE, FERRITIN, TIBC, IRON, RETICCTPCT in the last 72 hours. Urine analysis:    Component Value Date/Time   COLORURINE YELLOW 11/02/2019 0420   APPEARANCEUR HAZY (A) 11/02/2019 0420   LABSPEC 1.020 11/02/2019 0420   PHURINE 6.5 11/02/2019 0420   GLUCOSEU NEGATIVE 11/02/2019 0420   GLUCOSEU NEGATIVE 03/23/2019 1451   HGBUR NEGATIVE 11/02/2019 0420   BILIRUBINUR NEGATIVE 11/02/2019 0420   BILIRUBINUR NEGATIVE 03/23/2019 1457   KETONESUR 15 (A) 11/02/2019 0420  PROTEINUR NEGATIVE 11/02/2019 0420   UROBILINOGEN 0.2 03/23/2019 1457   UROBILINOGEN 0.2 03/23/2019 1451   NITRITE NEGATIVE 11/02/2019 0420   LEUKOCYTESUR NEGATIVE 11/02/2019 0420   Sepsis Labs: '@LABRCNTIP'$ (procalcitonin:4,lacticidven:4) ) Recent Results (from the past 240 hour(s))  SARS Coronavirus 2 Ag (30 min TAT) - Nasal Swab (BD Veritor Kit)     Status: None   Collection Time: 11/01/19 11:03 PM   Specimen: Nasal Swab (BD Veritor Kit)  Result Value Ref Range Status   SARS Coronavirus 2 Ag NEGATIVE NEGATIVE Final    Comment: (NOTE) SARS-CoV-2 antigen NOT DETECTED.  Negative results are presumptive.  Negative results do not preclude SARS-CoV-2 infection and should not be used as the sole basis for treatment or other patient management decisions, including infection  control decisions, particularly in the presence of clinical signs and  symptoms consistent with COVID-19, or in those who have been in contact with the virus.  Negative results must be combined with clinical observations, patient history, and epidemiological information. The expected result is Negative. Fact Sheet for Patients: PodPark.tn Fact Sheet for Healthcare Providers: GiftContent.is This test is not yet approved  or cleared by the [place] FDA and  has been authorized for detection and/or diagnosis of SARS-CoV-2 by FDA under an Emergency Use Authorization (EUA).  This EUA will remain in effect (meaning this test can be used) for the duration of  the COVID-19 de claration under Section 564(b)(1) of the Act, 21 U.S.C. section 360bbb-3(b)(1), unless the authorization is terminated or revoked sooner. Performed at [facility], [address] [place], [place] [address]      Radiological Exams on Admission: DG Chest Portable 1 View  Result Date: 11/01/2019 CLINICAL DATA:  Shortness of breath. Progressive shortness of breath today. Respiratory difficulty. EXAM: PORTABLE CHEST 1 VIEW COMPARISON:  11/23/2013 FINDINGS: The cardiomediastinal contours are normal. Mild bronchial and slightly interstitial thickening. No consolidation, pleural effusion, or pneumothorax. No acute osseous abnormalities are seen. IMPRESSION: Mild bronchial and interstitial thickening, may reflect bronchitis or pulmonary edema. Bronchitis is favored given normal heart size. Electronically Signed   By: Keith Rake M.D.   On: 11/01/2019 23:30    EKG: Independently reviewed.  Sinus tachycardia with RBBB.  Assessment/Plan Principal Problem:   Acute respiratory failure (HCC) Active Problems:   Hypothyroidism   Bipolar 1 disorder, depressed ([place])   Aspiration pneumonia ([place])    1. Acute respiratory failure with hypoxia likely from aspiration pneumonia for which patient is placed on Unasyn.  COVID-19 test is pending.  In addition we will check procalcitonin BNP and D-dimer.  Speech therapy evaluation.  Presently n.p.o. until he get speech therapy. 2. Tachycardia suspect likely from respiratory failure.  TSH and D-dimer are pending. 3. Hypothyroidism on Synthroid dose of which he needs to be confirmed. 4. History of bipolar disorder on Depakote dose is to be confirmed.  Depakote levels pending. 5. History of  dementia on Exelon patch. 6. History of hyperlipidemia on statins.  Home medication doses needs to be verified.   Covid test is pending.  Given that patient has acute respiratory failure with hypoxia will need close monitoring for any further deterioration in inpatient status.   DVT prophylaxis: Lovenox. Code Status: DNR confirmed with patient's husband. Family Communication: Discussed with patient's husband. Disposition Plan: Home when stable. Consults called: Speech therapy. Admission status: Inpatient.   Rise Patience MD [facility] Pager [phone].  If 7PM-7AM, please contact night-coverage [email or web address] Password [facility]  11/02/2019, 5:37 AM

## 2019-11-02 NOTE — ED Notes (Signed)
[facility] notified (Tammy) - patient ready for transport 

## 2019-11-03 LAB — BASIC METABOLIC PANEL
Anion gap: 7 (ref 5–15)
BUN: 18 mg/dL (ref 8–23)
CO2: 25 mmol/L (ref 22–32)
Calcium: 8.3 mg/dL — ABNORMAL LOW (ref 8.9–10.3)
Chloride: 109 mmol/L (ref 98–111)
Creatinine, Ser: 0.83 mg/dL (ref 0.44–1.00)
GFR calc Af Amer: >60 mL/min (ref >60)
GFR calc non Af Amer: >60 mL/min (ref >60)
Glucose, Bld: 122 mg/dL — ABNORMAL HIGH (ref 70–99)
Potassium: 3.7 mmol/L (ref 3.5–5.1)
Sodium: 141 mmol/L (ref 135–145)

## 2019-11-03 LAB — GLUCOSE, CAPILLARY: Glucose-Capillary: 106 mg/dL — ABNORMAL HIGH (ref 70–99)

## 2019-11-03 LAB — URINE CULTURE: Culture: NO GROWTH

## 2019-11-03 LAB — CBC
HCT: 31.6 % — ABNORMAL LOW (ref 36.0–46.0)
Hemoglobin: 10.3 g/dL — ABNORMAL LOW (ref 12.0–15.0)
MCH: 33.6 pg (ref 26.0–34.0)
MCHC: 32.6 g/dL (ref 30.0–36.0)
MCV: 102.9 fL — ABNORMAL HIGH (ref 80.0–100.0)
Platelets: 152 10*3/uL (ref 150–400)
RBC: 3.07 MIL/uL — ABNORMAL LOW (ref 3.87–5.11)
RDW: 13.2 % (ref 11.5–15.5)
WBC: 10.5 10*3/uL (ref 4.0–10.5)
nRBC: 0 % (ref 0.0–0.2)

## 2019-11-03 MED ORDER — IPRATROPIUM-ALBUTEROL 0.5-2.5 (3) MG/3ML IN SOLN
3.0000 mL | Freq: Two times a day (BID) | RESPIRATORY_TRACT | Status: DC
  Administered 2019-11-03 – 2019-11-04 (×2): 3 mL via RESPIRATORY_TRACT
  Filled 2019-11-03 (×3): qty 3

## 2019-11-03 MED ORDER — IPRATROPIUM-ALBUTEROL 0.5-2.5 (3) MG/3ML IN SOLN: 3.0000 mL | RESPIRATORY_TRACT | Status: DC | PRN

## 2019-11-03 MED ORDER — PHENOL 1.4 % MT LIQD
1.0000 | OROMUCOSAL | Status: DC | PRN
  Administered 2019-11-03: 1 via OROMUCOSAL
  Filled 2019-11-03: qty 177

## 2019-11-03 MED ORDER — POLYETHYLENE GLYCOL 3350 17 G PO PACK: 17.0000 g | PACK | Freq: Every day | ORAL | Status: DC | PRN

## 2019-11-03 MED ORDER — POTASSIUM CHLORIDE 10 MEQ/100ML IV SOLN
10.0000 meq | INTRAVENOUS | Status: AC
  Administered 2019-11-03 (×3): 10 meq via INTRAVENOUS
  Filled 2019-11-03 (×3): qty 100

## 2019-11-03 MED ORDER — SENNOSIDES-DOCUSATE SODIUM 8.6-50 MG PO TABS: 2.0000 | ORAL_TABLET | Freq: Every evening | ORAL | Status: DC | PRN

## 2019-11-03 MED ORDER — IPRATROPIUM-ALBUTEROL 0.5-2.5 (3) MG/3ML IN SOLN
3.0000 mL | Freq: Four times a day (QID) | RESPIRATORY_TRACT | Status: DC
  Administered 2019-11-03: 3 mL via RESPIRATORY_TRACT
  Filled 2019-11-03 (×2): qty 3

## 2019-11-03 NOTE — Evaluation (Signed)
Physical Therapy Evaluation Patient Details Name: Emily Castaneda MRN: [id number] DOB: April 25, 1935 Today's Date: 11/03/2019   History of Present Illness  85 year old with history of dementia, depression, HLD, hypothyroidism, OSA on CPAP admitted from [facility] to [place] pneumonia for progressive dyspnea on exertion, cough and difficulty swallowing.  PT admitted with suspicion for aspiration pneumonia/sepsis.     Clinical Impression  Emily Castaneda is 85 y.o. female admitted with above HPI and diagnosis. Patient is currently limited by functional impairments below (see PT problem list). Patient lives with her husband and has intermittent assist from her daughters as well, per patient report. Patient is not fully reliable historian given baseline dementia. She reports modified independence for mobility with quad cane or rollator and states she is independent with ADL's. At this time pt requires min assist for functional mobility. Patient remained on 1L/min of O2 and SpO2 remained 88% or greater with mobility. She will benefit from continued skilled PT interventions to address impairments and progress independence with mobility, recommending 24/hour assist and HHPT if pt's family is able to provide adequate supervision/assist; if family does not feel able pt would benefit from skilled PT and care at SNF level. Acute PT will follow and progress as able.     Follow Up Recommendations Home health PT;Supervision/Assistance - 24 hour;SNF(HHPT vs SNF depending on family support)    Equipment Recommendations  Other (comment)(TBD)    Recommendations for Other Services       Precautions / Restrictions Precautions Precautions: Fall Restrictions Weight Bearing Restrictions: No      Mobility  Bed Mobility Overal bed mobility: Needs Assistance Bed Mobility: Supine to Sit     Supine to sit: HOB elevated;Min assist     General bed mobility comments: cues for use of bed rails and assist to bring LE's  to EOB and raise trunk up.  Transfers Overall transfer level: Needs assistance Equipment used: Rolling walker (2 wheeled) Transfers: Sit to/from [facility] Sit to Stand: Min assist Stand pivot transfers: Min assist       General transfer comment: cues for safe hand placement and assist to perform power up from EOB and BSC. min assist to rise and steady with stand step/pivot to [facility]. pt required ceus for safety to turn fully prior to sitting on BSC. Pt performed 4x sit<>stand total from bed, BSC, and recliner. Pt required 2+ assist to maintain standing balance and complete pericare after BM while sitting in recliner.   Ambulation/Gait Ambulation/Gait assistance: Min assist Gait Distance (Feet): 8 Feet Assistive device: Rolling walker (2 wheeled) Gait Pattern/deviations: Step-through pattern;Decreased step length - right;Decreased step length - left;Decreased stride length;Trunk flexed Gait velocity: decreased   General Gait Details: min assist to maintain safe proximty and hand placement on RW throughout. verbal/tactile cues required repeatedly during short bout of gait in room as pt removing hand from grip bar and moving walker farther away from herself.  Stairs            Wheelchair Mobility    Modified Rankin (Stroke Patients Only)       Balance Overall balance assessment: Needs assistance Sitting-balance support: Feet unsupported Sitting balance-Leahy Scale: Fair     Standing balance support: During functional activity;Bilateral upper extremity supported Standing balance-Leahy Scale: Fair              Pertinent Vitals/Pain Pain Assessment: No/denies pain    Home Living Family/patient expects to be discharged to:: Private residence Living Arrangements: Spouse/significant other Available Help  at Discharge: Family;Available 24 hours/day;Available PRN/intermittently(pt lives with her husband, daughters stop by to check in) Type of Home:  House Home Access: Stairs to enter;Ramped entrance     Home Layout: One level Home Equipment: Grab bars - tub/shower;Grab bars - toilet;Shower seat - built in;Walker - 4 wheels;Cane - quad      Prior Function Level of Independence: Independent with assistive device(s);Needs assistance   Gait / Transfers Assistance Needed: p ambulates in home with Rollator and sometimes with quad cane  ADL's / Homemaking Assistance Needed: pt reports independence with all ADL's but was uable to perform pericare or don socks during evaluation  Comments: pt reports she was doing the foodshopping herself and walking around store with a cart     Hand Dominance        Extremity/Trunk Assessment   Upper Extremity Assessment Upper Extremity Assessment: Generalized weakness    Lower Extremity Assessment Lower Extremity Assessment: Generalized weakness    Cervical / Trunk Assessment Cervical / Trunk Assessment: Kyphotic  Communication   Communication: No difficulties  Cognition Arousal/Alertness: Awake/alert Behavior During Therapy: WFL for tasks assessed/performed Overall Cognitive Status: History of cognitive impairments - at baseline          General Comments: pt alert to self, place and somewhat to situation. she did miss her birthdate by 2 years.      General Comments      Exercises     Assessment/Plan    PT Assessment Patient needs continued PT services  PT Problem List Decreased strength;Decreased activity tolerance;Decreased balance;Decreased mobility;Decreased cognition;Decreased knowledge of use of DME;Decreased knowledge of precautions;Decreased safety awareness       PT Treatment Interventions DME instruction;Gait training;Functional mobility training;Therapeutic activities;Therapeutic exercise;Balance training;Patient/family education    PT Goals (Current goals can be found in the Care Plan section)  Acute Rehab PT Goals Patient Stated Goal: to get back home PT Goal  Formulation: With patient Time For Goal Achievement: 11/17/19 Potential to Achieve Goals: Fair    Frequency Min 3X/week    AM-PAC PT "6 Clicks" Mobility  Outcome Measure Help needed turning from your back to your side while in a flat bed without using bedrails?: A Little Help needed moving from lying on your back to sitting on the side of a flat bed without using bedrails?: A Little Help needed moving to and from a bed to a chair (including a wheelchair)?: A Little Help needed standing up from a chair using your arms (e.g., wheelchair or bedside chair)?: A Little Help needed to walk in hospital room?: A Little Help needed climbing 3-5 steps with a railing? : A Lot 6 Click Score: 17    End of Session Equipment Utilized During Treatment: Gait belt;Oxygen(1L/min) Activity Tolerance: Patient tolerated treatment well Patient left: in chair;with call bell/phone within reach;with nursing/sitter in room Nurse Communication: Mobility status PT Visit Diagnosis: Unsteadiness on feet (R26.81);Muscle weakness (generalized) (M62.81);Difficulty in walking, not elsewhere classified (R26.2)    Time: [id number] PT Time Calculation (min) (ACUTE ONLY): 35 min   Charges:   PT Evaluation $PT Eval Moderate Complexity: 1 Mod PT Treatments $Therapeutic Activity: 8-22 mins       Verner Mould, DPT Physical Therapist with [facility] [phone]  11/03/2019 1:21 PM

## 2019-11-03 NOTE — Progress Notes (Signed)
PROGRESS NOTE    Emily Castaneda  [id number] DOB: 1934/11/22 DOA: 11/01/2019 PCP: Ronnald Nian, DO   Brief Narrative:  85 year old with history of dementia, depression, HLD, hypothyroidism, OSA on CPAP admitted from [facility] to [facility] pneumonia for progressive dyspnea on exertion, cough and difficulty swallowing.  Suspicion for aspiration pneumonia/sepsis started on Unasyn.   Assessment & Plan:   Principal Problem:   Acute respiratory failure (HCC) Active Problems:   Hypothyroidism   Bipolar 1 disorder, depressed ([place])   Aspiration pneumonia ([place])  Sepsis secondary to aspiration pneumonia Acute hypoxic respiratory failure secondary to aspiration Dysphagia -CTA chest is negative for PE, shows left lower lobe infiltrates. -Sepsis physiology is slowly improving, continue supplemental oxygen procalcitonin as high as 15.33.  BNP 202.  Leukocytosis improving. -COVID-19-negative -Bronchodilators, I-S/flutter-added -Continue IV Unasyn -Renew IV fluids low rate 50 cc/h for 24 hours  Dysphagia -Seen by speech-dysphagia 3 diet  Macrocytic anemia -Hemoglobin is stable around 10.3.  Hypothyroidism -Continue Synthroid  Hyperlipidemia -Statin  Dementia/bipolar -Continue home medications-Exelon patch, Depakote   PT/OT ordered  DVT prophylaxis: Lovenox Code Status: DNR Family Communication: Family at bedside Disposition Plan:   Patient From= home  Patient Anticipated D/C place= to be determined  Barriers= continue IV antibiotics for aspiration pneumonia, p.o. intake is inconsistent.  We will plan on weaning her off oxygen today, obtaining PT/OT.  Hopefully discharge over next 24 hours   Subjective: Feels better, tells me she is able to cough up a lot of mucus.  Remains afebrile.  Review of Systems Otherwise negative except as per HPI, including: General: Denies fever, chills, night sweats or unintended weight loss. Resp: Denies hemoptysis Cardiac: Denies chest  pain, palpitations, orthopnea, paroxysmal nocturnal dyspnea. GI: Denies abdominal pain, nausea, vomiting, diarrhea or constipation GU: Denies dysuria, frequency, hesitancy or incontinence MS: Denies muscle aches, joint pain or swelling Neuro: Denies headache, neurologic deficits (focal weakness, numbness, tingling), abnormal gait Psych: Denies anxiety, depression, SI/HI/AVH Skin: Denies new rashes or lesions ID: Denies sick contacts, exotic exposures, travel  Examination:  Constitutional: Not in acute distress, 2-4 L nasal cannula Respiratory: Some bibasilar rhonchi Cardiovascular: Normal sinus rhythm, no rubs Abdomen: Nontender nondistended good bowel sounds Musculoskeletal: No edema noted Skin: No rashes seen Neurologic: CN 2-12 grossly intact.  And nonfocal Psychiatric: Normal judgment and insight, alert awake oriented X3.  Slightly forgetful     Objective: Vitals:   11/03/19 0300 11/03/19 0400 11/03/19 0535 11/03/19 0608  BP: 102/81 (!) 117/40 (!) 117/40 (!) 99/58  Pulse: 91 90 84 87  Resp: (!) 29 (!) 32 (!) 26 (!) 33  Temp:  98.4 F (36.9 C)    TempSrc:  Axillary    SpO2: 96% 96% 96% 96%  Weight:      Height:        Intake/Output Summary (Last 24 hours) at 11/03/2019 0744 Last data filed at 11/03/2019 0600 Gross per 24 hour  Intake 2225.97 ml  Output 1150 ml  Net 1075.97 ml   Filed Weights   11/02/19 0443  Weight: 85.6 kg     Data Reviewed:   CBC: Recent Labs  Lab 11/01/19 2303 11/02/19 0611 11/03/19 0340  WBC 20.5* 12.8* 10.5  NEUTROABS 16.5* 10.9*  --   HGB 12.2 11.8* 10.3*  HCT 36.4 37.1 31.6*  MCV 99.7 102.8* 102.9*  PLT 196 183 629   Basic Metabolic Panel: Recent Labs  Lab 11/01/19 2303 11/02/19 0611 11/03/19 0340  NA 139 137 141  K 4.0 4.2  3.7  CL 104 101 109  CO2 '24 26 25  '$ GLUCOSE 136* 151* 122*  BUN '22 19 18  '$ CREATININE 0.90 0.83 0.83  CALCIUM 9.3 9.1 8.3*   GFR: Estimated Creatinine Clearance: 53 mL/min (by C-G formula based  on SCr of 0.83 mg/dL). Liver Function Tests: Recent Labs  Lab 11/02/19 0611  AST 33  ALT 22  ALKPHOS 48  BILITOT 0.8  PROT 6.4*  ALBUMIN 3.0*   No results for input(s): LIPASE, AMYLASE in the last 168 hours. No results for input(s): AMMONIA in the last 168 hours. Coagulation Profile: Recent Labs  Lab 11/01/19 2303  INR 1.2   Cardiac Enzymes: No results for input(s): CKTOTAL, CKMB, CKMBINDEX, TROPONINI in the last 168 hours. BNP (last 3 results) No results for input(s): PROBNP in the last 8760 hours. HbA1C: No results for input(s): HGBA1C in the last 72 hours. CBG: Recent Labs  Lab 11/02/19 0845 11/02/19 1537 11/02/19 2348  GLUCAP 131* 111* 105*   Lipid Profile: No results for input(s): CHOL, HDL, LDLCALC, TRIG, CHOLHDL, LDLDIRECT in the last 72 hours. Thyroid Function Tests: Recent Labs    11/02/19 0611  TSH 1.683   Anemia Panel: No results for input(s): VITAMINB12, FOLATE, FERRITIN, TIBC, IRON, RETICCTPCT in the last 72 hours. Sepsis Labs: Recent Labs  Lab 11/01/19 2303 11/02/19 0611  PROCALCITON  --  15.33  LATICACIDVEN 1.9  --     Recent Results (from the past 240 hour(s))  SARS Coronavirus 2 Ag (30 min TAT) - Nasal Swab (BD Veritor Kit)     Status: None   Collection Time: 11/01/19 11:03 PM   Specimen: Nasal Swab (BD Veritor Kit)  Result Value Ref Range Status   SARS Coronavirus 2 Ag NEGATIVE NEGATIVE Final    Comment: (NOTE) SARS-CoV-2 antigen NOT DETECTED.  Negative results are presumptive.  Negative results do not preclude SARS-CoV-2 infection and should not be used as the sole basis for treatment or other patient management decisions, including infection  control decisions, particularly in the presence of clinical signs and  symptoms consistent with COVID-19, or in those who have been in contact with the virus.  Negative results must be combined with clinical observations, patient history, and epidemiological information. The expected result  is Negative. Fact Sheet for Patients: PodPark.tn Fact Sheet for Healthcare Providers: GiftContent.is This test is not yet approved or cleared by the [place] FDA and  has been authorized for detection and/or diagnosis of SARS-CoV-2 by FDA under an Emergency Use Authorization (EUA).  This EUA will remain in effect (meaning this test can be used) for the duration of  the COVID-19 de claration under Section 564(b)(1) of the Act, 21 U.S.C. section 360bbb-3(b)(1), unless the authorization is terminated or revoked sooner. Performed at [facility], [address] [place], [place] [address]   Blood Culture (routine x 2)     Status: None (Preliminary result)   Collection Time: 11/01/19 11:20 PM   Specimen: BLOOD RIGHT HAND  Result Value Ref Range Status   Specimen Description   Final    BLOOD RIGHT HAND Performed at [facility], [address] [place], [place] [address]    Special Requests   Final    BOTTLES DRAWN AEROBIC AND ANAEROBIC Blood Culture results may not be optimal due to an inadequate volume of blood received in culture bottles Performed at [facility], [address], [place], [place] [address]    Culture  Final    NO GROWTH < 24 HOURS Performed at [facility], [place] [address], [place], [place] [address]    Report Status PENDING  Incomplete  Blood Culture (routine x 2)     Status: None (Preliminary result)   Collection Time: 11/01/19 11:40 PM   Specimen: BLOOD LEFT ARM  Result Value Ref Range Status   Specimen Description   Final    BLOOD LEFT ARM Performed at [facility], [address] [place], [place] [address]    Special Requests   Final    BOTTLES DRAWN AEROBIC AND ANAEROBIC Blood Culture adequate volume Performed at [facility], [address] [place], [place] [address]    Culture   Final    NO GROWTH < 24 HOURS Performed at [facility], [place] [address] [place], [place] [address]    Report Status PENDING  Incomplete  SARS CORONAVIRUS 2 (TAT 6-24 HRS) Nasopharyngeal Nasopharyngeal Swab     Status: None   Collection Time: 11/02/19 12:34 AM   Specimen: Nasopharyngeal Swab  Result Value Ref Range Status   SARS Coronavirus 2 NEGATIVE NEGATIVE Final    Comment: (NOTE) SARS-CoV-2 target nucleic acids are NOT DETECTED. The SARS-CoV-2 RNA is generally detectable in upper and lower respiratory specimens during the acute phase of infection. Negative results do not preclude SARS-CoV-2 infection, do not rule out co-infections with other pathogens, and should not be used as the sole basis for treatment or other patient management decisions. Negative results must be combined with clinical observations, patient history, and epidemiological information. The expected result is Negative. Fact Sheet for Patients: SugarRoll.be Fact Sheet for Healthcare Providers: https://www.woods-mathews.com/ This test is not yet approved or cleared by the [place] FDA and  has been authorized for detection and/or diagnosis of SARS-CoV-2 by FDA under an Emergency Use Authorization (EUA). This EUA will remain  in effect (meaning this test can be used) for the duration of the COVID-19 declaration under Section 56 4(b)(1) of the Act, 21 U.S.C. section 360bbb-3(b)(1), unless the authorization is terminated or revoked sooner. Performed at [facility], [place] [address], [place], [place] [address]   MRSA PCR Screening     Status: None   Collection Time: 11/02/19  4:16 AM   Specimen: Nasopharyngeal  Result Value Ref Range Status   MRSA by PCR NEGATIVE NEGATIVE Final    Comment:        The GeneXpert MRSA Assay (FDA approved for NASAL specimens only), is one component of a comprehensive MRSA colonization surveillance program. It is not intended to diagnose MRSA infection nor to guide  or monitor treatment for MRSA infections. Performed at [facility], [place] [address] [place], [place] [address]          Radiology Studies: CT ANGIO CHEST PE W OR WO CONTRAST  Result Date: 11/02/2019 CLINICAL DATA:  Shortness of breath since yesterday morning, progressively worsened, productive cough, denies chest pain; history of dementia, hypothyroidism, former smoker EXAM: CT ANGIOGRAPHY CHEST WITH CONTRAST TECHNIQUE: Multidetector CT imaging of the chest was performed using the standard protocol during bolus administration of intravenous contrast. Multiplanar CT image reconstructions and MIPs were obtained to evaluate the vascular anatomy. CONTRAST:  111m OMNIPAQUE IOHEXOL 350 MG/ML SOLN IV COMPARISON:  None FINDINGS: Cardiovascular: Mild atherosclerotic calcifications aorta, proximal great vessels, minimally coronary arteries. Aorta normal caliber without aneurysm or dissection. Heart unremarkable without pericardial effusion. Pulmonary arteries adequately opacified Mediastinum/Nodes:  Base of cervical region normal appearance. Few normal size mediastinal lymph nodes without thoracic adenopathy. Esophagus unremarkable. Lungs/Pleura: Focus of ground-glass opacity in RIGHT upper lobe image 50, 9 x 6 mm. Subsegmental atelectasis RIGHT lower lobe. Consolidation of LEFT lower lobe. Additional patchy scattered infiltrates in LEFT upper lobe. Findings favor multifocal pneumonia. No pleural effusion or pneumothorax. Upper Abdomen: Small cyst LEFT lobe liver 17 x 10 mm image 84. Remaining visualized upper abdomen unremarkable. Musculoskeletal: Minimal scattered degenerative disc disease changes thoracic spine. No acute osseous findings. Review of the MIP images confirms the above findings. IMPRESSION: No evidence of pulmonary embolism. Consolidation of LEFT lower lobe with additional patchy infiltrates in LEFT upper lobe favoring multifocal pneumonia. Additional focus of questionable  infiltrate versus ground-glass opacity RIGHT upper lobe 9 x 6 mm; short-term follow-up CT recommended in 3 months to assess for persistence. Aortic Atherosclerosis (ICD10-I70.0). Electronically Signed   By: Lavonia Dana M.D.   On: 11/02/2019 15:34   DG Chest Portable 1 View  Result Date: 11/01/2019 CLINICAL DATA:  Shortness of breath. Progressive shortness of breath today. Respiratory difficulty. EXAM: PORTABLE CHEST 1 VIEW COMPARISON:  11/23/2013 FINDINGS: The cardiomediastinal contours are normal. Mild bronchial and slightly interstitial thickening. No consolidation, pleural effusion, or pneumothorax. No acute osseous abnormalities are seen. IMPRESSION: Mild bronchial and interstitial thickening, may reflect bronchitis or pulmonary edema. Bronchitis is favored given normal heart size. Electronically Signed   By: Keith Rake M.D.   On: 11/01/2019 23:30        Scheduled Meds: . atorvastatin  10 mg Oral q1800  . Chlorhexidine Gluconate Cloth  6 each Topical Daily  . divalproex  1,250 mg Oral QPM  . enoxaparin (LOVENOX) injection  40 mg Subcutaneous Q24H  . levothyroxine  112 mcg Oral QAC breakfast  . mouth rinse  15 mL Mouth Rinse BID  . multivitamin  1 tablet Oral Daily  . rivastigmine  13.3 mg Transdermal Daily  . Vitamin D (Ergocalciferol)  50,000 Units Oral Q7 days   Continuous Infusions: . sodium chloride    . sodium chloride Stopped (11/03/19 0537)  . ampicillin-sulbactam (UNASYN) IV Stopped (11/03/19 6190)     LOS: 1 day   Time spent= 25 mins    Kayveon Lennartz Arsenio Loader, MD [facility]  If 7PM-7AM, please contact night-coverage  11/03/2019, 7:44 AM

## 2019-11-04 DIAGNOSIS — E039 Hypothyroidism, unspecified: Secondary | ICD-10-CM

## 2019-11-04 DIAGNOSIS — F319 Bipolar disorder, unspecified: Secondary | ICD-10-CM

## 2019-11-04 LAB — BASIC METABOLIC PANEL
Anion gap: 8 (ref 5–15)
BUN: 23 mg/dL (ref 8–23)
CO2: 25 mmol/L (ref 22–32)
Calcium: 8.6 mg/dL — ABNORMAL LOW (ref 8.9–10.3)
Chloride: 111 mmol/L (ref 98–111)
Creatinine, Ser: 0.74 mg/dL (ref 0.44–1.00)
GFR calc Af Amer: >60 mL/min (ref >60)
GFR calc non Af Amer: >60 mL/min (ref >60)
Glucose, Bld: 102 mg/dL — ABNORMAL HIGH (ref 70–99)
Potassium: 3.9 mmol/L (ref 3.5–5.1)
Sodium: 144 mmol/L (ref 135–145)

## 2019-11-04 LAB — CBC
HCT: 30.9 % — ABNORMAL LOW (ref 36.0–46.0)
Hemoglobin: 9.9 g/dL — ABNORMAL LOW (ref 12.0–15.0)
MCH: 32.9 pg (ref 26.0–34.0)
MCHC: 32 g/dL (ref 30.0–36.0)
MCV: 102.7 fL — ABNORMAL HIGH (ref 80.0–100.0)
Platelets: 166 10*3/uL (ref 150–400)
RBC: 3.01 MIL/uL — ABNORMAL LOW (ref 3.87–5.11)
RDW: 13.2 % (ref 11.5–15.5)
WBC: 11.7 10*3/uL — ABNORMAL HIGH (ref 4.0–10.5)
nRBC: 0 % (ref 0.0–0.2)

## 2019-11-04 LAB — MAGNESIUM: Magnesium: 2.1 mg/dL (ref 1.7–2.4)

## 2019-11-04 NOTE — Progress Notes (Signed)
Physical Therapy Treatment Patient Details Name: Emily Castaneda MRN: [id number] DOB: 1935-05-29 Today's Date: 11/04/2019    History of Present Illness 85 year old with history of dementia, depression, HLD, hypothyroidism, OSA on CPAP admitted from [facility] to [place] pneumonia for progressive dyspnea on exertion, cough and difficulty swallowing.  PT admitted with suspicion for aspiration pneumonia/sepsis.     PT Comments    Patient progressed well with therapy today and ambulated ~160' with min assist. Pt continues to require verbal cues to maintain safe proximity to RW throughout session. She maintained SpO2 of 97% or greater on 2L/min with no SOB, after dropping O2 to 1L and then RA for ~30 seconds pt's sats dropped to 93% and she reported increased SOB. Patient completed toileting and pericare without assist today but required extra time. Educated pt on technique for sit<>stand and performed repeated sit<>stands for LE strengthening, pt improved sequencing with practice. Acute PT will continue to progress as able.    Follow Up Recommendations  Home health PT;Supervision/Assistance - 24 hour     Equipment Recommendations  None recommended by PT    Recommendations for Other Services       Precautions / Restrictions Precautions Precautions: Fall Restrictions Weight Bearing Restrictions: No    Mobility  Bed Mobility Overal bed mobility: Needs Assistance Bed Mobility: Supine to Sit     Supine to sit: HOB elevated;Min assist     General bed mobility comments: Pt OOB in recliner at start and EOS.  Transfers Overall transfer level: Needs assistance Equipment used: Rolling walker (2 wheeled) Transfers: Sit to/from Stand Sit to Stand: Min assist Stand pivot transfers: Min assist       General transfer comment: verbal cues required for safe hand placement and technique to initiate power up from recliner. Pt performed 2x sit<>stand from recliner and 1x from toilet, min assist  required to raise up from toilet and min guard from recliner.   Ambulation/Gait Ambulation/Gait assistance: Min assist Gait Distance (Feet): 160 Feet Assistive device: Rolling walker (2 wheeled) Gait Pattern/deviations: Step-through pattern;Decreased step length - right;Decreased step length - left;Decreased stride length;Trunk flexed Gait velocity: decreased   General Gait Details: Min assist for RW managment. Cues throughout for posture and to maintain safe proximity to RW. Pt on 2L/min O2 and maintained SpO2 at 97% or greater. O2 decreased to 1L/min and RA for ~ 30 seconds, pt desaturated to ~93% and reported increased SOB. Returned to 98% with 2L/min.    Stairs          Wheelchair Mobility    Modified Rankin (Stroke Patients Only)       Balance Overall balance assessment: Needs assistance Sitting-balance support: Feet unsupported Sitting balance-Leahy Scale: Fair     Standing balance support: During functional activity;Bilateral upper extremity supported Standing balance-Leahy Scale: Fair         Cognition Arousal/Alertness: Awake/alert Behavior During Therapy: WFL for tasks assessed/performed Overall Cognitive Status: History of cognitive impairments - at baseline          General Comments: pt alert to self and place, cues required to get date and Holiday ([place]. [place] Day).      Exercises Other Exercises Other Exercises: Repeated Sit<>Stands: 5x with cues for technique to push up with arms from armrest and to reach back for controlled lowering.    General Comments        Pertinent Vitals/Pain Pain Assessment: No/denies pain    Home Living Family/patient expects to be discharged to:: Private residence Living  Arrangements: Spouse/significant other Available Help at Discharge: Family;Available 24 hours/day;Available PRN/intermittently Type of Home: House Home Access: Stairs to enter;Ramped entrance     Home Equipment: Grab bars - tub/shower;Grab  bars - toilet;Shower seat - built in;Walker - 4 wheels;Cane - quad      Prior Function Level of Independence: Independent with assistive device(s);Needs assistance  Gait / Transfers Assistance Needed: p ambulates in home with Rollator and sometimes with quad cane ADL's / Homemaking Assistance Needed: pt reports independence with all ADL's but was uable to perform pericare or don socks during evaluation     PT Goals (current goals can now be found in the care plan section) Acute Rehab PT Goals Patient Stated Goal: to get back home PT Goal Formulation: With patient Time For Goal Achievement: 11/17/19 Potential to Achieve Goals: Fair Progress towards PT goals: Progressing toward goals    Frequency    Min 3X/week      PT Plan Current plan remains appropriate    AM-PAC PT "6 Clicks" Mobility   Outcome Measure  Help needed turning from your back to your side while in a flat bed without using bedrails?: A Little Help needed moving from lying on your back to sitting on the side of a flat bed without using bedrails?: A Little Help needed moving to and from a bed to a chair (including a wheelchair)?: A Little Help needed standing up from a chair using your arms (e.g., wheelchair or bedside chair)?: A Little Help needed to walk in hospital room?: A Little Help needed climbing 3-5 steps with a railing? : A Lot 6 Click Score: 17    End of Session Equipment Utilized During Treatment: Gait belt;Oxygen(2L/min) Activity Tolerance: Patient tolerated treatment well Patient left: in chair;with call bell/phone within reach;with chair alarm set Nurse Communication: Mobility status PT Visit Diagnosis: Unsteadiness on feet (R26.81);Muscle weakness (generalized) (M62.81);Difficulty in walking, not elsewhere classified (R26.2)     Time: 1129-1209 PT Time Calculation (min) (ACUTE ONLY): 40 min  Charges:  $Gait Training: 8-22 mins $Therapeutic Exercise: 8-22 mins $Therapeutic Activity: 8-22  mins                     Verner Mould, DPT Physical Therapist with [facility] [phone]  11/04/2019 12:31 PM

## 2019-11-04 NOTE — Progress Notes (Signed)
  Speech Language Pathology Treatment: Dysphagia  Patient Details Name: Emily Castaneda MRN: [id number] DOB: [birth year] Today's Date: 11/04/2019 Time: 2836-6294 SLP Time Calculation (min) (ACUTE ONLY): 23 min  Assessment / Plan / Recommendation Clinical Impression  Pt acute dysphagia due to mentation has resolved, she easily passed 3 ounce yale water test today and demonstrated adequate mastication, oral transiting of pudding.   Recommend advance to regular/thin diet.  SLP educated pt to heimlich manuever, liquid best tolerated if aspirated  *water* and recommended general precautions *medicine with puree, etc.   HPI HPI: 85 y.o. female with history of dementia, hypothyroidism, bipolar disorder with progressive shortness of breath since yesterday morning.  Per pt's husband report to MD, the patient had a lot of difficulty taking her night medications due to difficulty swallowing.  ER.  Per MD note, in the ER patient was hypoxic requiring 6 L oxygen with a temperature of 100.30F Covid PCR is pending.  Chest x-ray shows infiltrates concerning for either pulmonary edema versus bronchitis. leukocytosis of 20.5 EKG shows sinus tachycardia with RBBB.  Being treated for possible aspiration pna.  Pt has h/o smoking.Initial Corona Virus test negative. Swallow evaluation ordered.      SLP Plan  All goals met       Recommendations  Diet recommendations: Regular;Thin liquid Liquids provided via: Cup;Straw Medication Administration: Whole meds with puree Supervision: Patient able to self feed Compensations: Slow rate;Small sips/bites Postural Changes and/or Swallow Maneuvers: Seated upright 90 degrees;Upright 30-60 min after meal                Oral Care Recommendations: Oral care BID Follow up Recommendations: None SLP Visit Diagnosis: Dysphagia, oral phase (R13.11) Plan: All goals met       GO                Macario Golds 11/04/2019, 2:10 PM  Kathleen Lime, MS [place] Office [phone]

## 2019-11-04 NOTE — Progress Notes (Signed)
SATURATION QUALIFICATIONS: (This note is used to comply with regulatory documentation for home oxygen)  Patient Saturations on Room Air at Rest = 93%  Patient Saturations on Room Air while Ambulating = 83%  Patient Saturations on 2 Liters of oxygen while Ambulating = 96%  Please briefly explain why patient needs home oxygen: pt oxygen saturation decreases to 83% without oxygen therapy while ambulating. Pt quickly recovered with 2 L nasal cannula to 96%.

## 2019-11-04 NOTE — Progress Notes (Signed)
PROGRESS NOTE  Emily Castaneda  HRC:[id number] DOB: [birth year] DOA: 11/01/2019 PCP: Ronnald Nian, DO   Brief Narrative: Emily Castaneda is an 85 y.o. female with a history of dementia, HLD, hypothyroidism, OSA on CPAP, and depression who presented due to low oxygen on pulse oximetry at home the day after an aspiration event. CT demonstrated LLL infiltrate as well as other scattered opacities, covid negative, and unasyn started for suspected aspiration pneumonia. Dysphagia diet was recommended by SLP. Leukocytosis improved and has recurred and she remains newly hypoxemic.  Assessment & Plan: Principal Problem:   Acute respiratory failure (HCC) Active Problems:   Hypothyroidism   Bipolar 1 disorder, depressed ([place])   Aspiration pneumonia ([place])  Acute hypoxemic respiratory failure and sepsis due to aspiration pneumonia, dysphagia: No PE on CTA chest, PCT severely elevated and leukocytosis is recurrent suggestive of ongoing silent/microaspiration. Covid negative.  - Continue supplemental oxygen to maintain SpO2 >90%, ambulatory pulse oximetry this morning confirms ongoing hypoxia.  - Continue unasyn, can transition to augmentin once improved.   - Continue dysphagia diet, SLP evaluation, aspiration precautions. Low level IV fluids continued.   Macrocytic anemia: Worsening despite no active bleeding. Possibly nutritionally mediated and due to acute illness.  - Check anemia panel and blood counts in AM. Last B12 was >500.  Obesity: BMI Body mass index is 34.52 kg/m.   Bipolar disorder, depression: Normal mood currently.  - continue home medications  HLD:  - Continue statin  Hypothyroidism: TSH wnl. - Continue synthroid   DVT prophylaxis: Lovenox Code Status: DNR Family Communication: Husband at bedside Disposition Plan: Home with home health pending improvement in new hypoxemia. Requiring IV antibiotics for aspiration pneumonia.  Consultants:   None  Procedures:    None  Antimicrobials:  Unasyn   Subjective: Shortness of breath is resolved at rest, moderate with exertion, limiting mobility to [facility] with assistance. Eating a bit better today.   Objective: Vitals:   11/04/19 1047 11/04/19 1049 11/04/19 1050 11/04/19 1328  BP:    (!) 119/52  Pulse:   88 89  Resp:    18  Temp:    98.8 F (37.1 C)  TempSrc:    Oral  SpO2: 93% (!) 83% 96% 95%  Weight:      Height:        Intake/Output Summary (Last 24 hours) at 11/04/2019 1533 Last data filed at 11/04/2019 1436 Gross per 24 hour  Intake 360 ml  Output --  Net 360 ml   Filed Weights   11/02/19 0443  Weight: 85.6 kg   Gen: 85 y.o. female in no distress  Pulm: Non-labored breathing room air. Clear to auscultation bilaterally.  CV: Regular rate and rhythm. No murmur, rub, or gallop. No JVD, no pedal edema. GI: Abdomen soft, non-tender, non-distended, with normoactive bowel sounds. No organomegaly or masses felt. Ext: Warm, no deformities Skin: No rashes, lesions or ulcers Neuro: Alert and mostly oriented. No focal neurological deficits. Psych: Judgement and insight appear impaired. Mood & affect appropriate.   Data Reviewed: I have personally reviewed following labs and imaging studies  CBC: Recent Labs  Lab 11/01/19 2303 11/02/19 0611 11/03/19 0340 11/04/19 0514  WBC 20.5* 12.8* 10.5 11.7*  NEUTROABS 16.5* 10.9*  --   --   HGB 12.2 11.8* 10.3* 9.9*  HCT 36.4 37.1 31.6* 30.9*  MCV 99.7 102.8* 102.9* 102.7*  PLT 196 183 152 680   Basic Metabolic Panel: Recent Labs  Lab 11/01/19 2303 11/02/19 0611 11/03/19 0340  11/04/19 0514  NA 139 137 141 144  K 4.0 4.2 3.7 3.9  CL 104 101 109 111  CO2 '24 26 25 25  '$ GLUCOSE 136* 151* 122* 102*  BUN '22 19 18 23  '$ CREATININE 0.90 0.83 0.83 0.74  CALCIUM 9.3 9.1 8.3* 8.6*  MG  --   --   --  2.1   GFR: Estimated Creatinine Clearance: 55 mL/min (by C-G formula based on SCr of 0.74 mg/dL). Liver Function Tests: Recent Labs  Lab  11/02/19 0611  AST 33  ALT 22  ALKPHOS 48  BILITOT 0.8  PROT 6.4*  ALBUMIN 3.0*   No results for input(s): LIPASE, AMYLASE in the last 168 hours. No results for input(s): AMMONIA in the last 168 hours. Coagulation Profile: Recent Labs  Lab 11/01/19 2303  INR 1.2   Cardiac Enzymes: No results for input(s): CKTOTAL, CKMB, CKMBINDEX, TROPONINI in the last 168 hours. BNP (last 3 results) No results for input(s): PROBNP in the last 8760 hours. HbA1C: No results for input(s): HGBA1C in the last 72 hours. CBG: Recent Labs  Lab 11/02/19 0845 11/02/19 1537 11/02/19 2348 11/03/19 0742  GLUCAP 131* 111* 105* 106*   Lipid Profile: No results for input(s): CHOL, HDL, LDLCALC, TRIG, CHOLHDL, LDLDIRECT in the last 72 hours. Thyroid Function Tests: Recent Labs    11/02/19 0611  TSH 1.683   Anemia Panel: No results for input(s): VITAMINB12, FOLATE, FERRITIN, TIBC, IRON, RETICCTPCT in the last 72 hours. Urine analysis:    Component Value Date/Time   COLORURINE YELLOW 11/02/2019 0420   APPEARANCEUR HAZY (A) 11/02/2019 0420   LABSPEC 1.020 11/02/2019 0420   PHURINE 6.5 11/02/2019 0420   GLUCOSEU NEGATIVE 11/02/2019 0420   GLUCOSEU NEGATIVE 03/23/2019 1451   HGBUR NEGATIVE 11/02/2019 0420   BILIRUBINUR NEGATIVE 11/02/2019 0420   BILIRUBINUR NEGATIVE 03/23/2019 1457   KETONESUR 15 (A) 11/02/2019 0420   PROTEINUR NEGATIVE 11/02/2019 0420   UROBILINOGEN 0.2 03/23/2019 1457   UROBILINOGEN 0.2 03/23/2019 1451   NITRITE NEGATIVE 11/02/2019 0420   LEUKOCYTESUR NEGATIVE 11/02/2019 0420   Recent Results (from the past 240 hour(s))  SARS Coronavirus 2 Ag (30 min TAT) - Nasal Swab (BD Veritor Kit)     Status: None   Collection Time: 11/01/19 11:03 PM   Specimen: Nasal Swab (BD Veritor Kit)  Result Value Ref Range Status   SARS Coronavirus 2 Ag NEGATIVE NEGATIVE Final    Comment: (NOTE) SARS-CoV-2 antigen NOT DETECTED.  Negative results are presumptive.  Negative results do not  preclude SARS-CoV-2 infection and should not be used as the sole basis for treatment or other patient management decisions, including infection  control decisions, particularly in the presence of clinical signs and  symptoms consistent with COVID-19, or in those who have been in contact with the virus.  Negative results must be combined with clinical observations, patient history, and epidemiological information. The expected result is Negative. Fact Sheet for Patients: PodPark.tn Fact Sheet for Healthcare Providers: GiftContent.is This test is not yet approved or cleared by the [place] FDA and  has been authorized for detection and/or diagnosis of SARS-CoV-2 by FDA under an Emergency Use Authorization (EUA).  This EUA will remain in effect (meaning this test can be used) for the duration of  the COVID-19 de claration under Section 564(b)(1) of the Act, 21 U.S.C. section 360bbb-3(b)(1), unless the authorization is terminated or revoked sooner. Performed at [facility], [address] [place], [place] [address]   Blood Culture (  routine x 2)     Status: None (Preliminary result)   Collection Time: 11/01/19 11:20 PM   Specimen: BLOOD RIGHT HAND  Result Value Ref Range Status   Specimen Description   Final    BLOOD RIGHT HAND Performed at [facility], [address] [place], [place] [address]    Special Requests   Final    BOTTLES DRAWN AEROBIC AND ANAEROBIC Blood Culture results may not be optimal due to an inadequate volume of blood received in culture bottles Performed at [facility], [address] [place], [place] [address]    Culture   Final    NO GROWTH 2 DAYS Performed at [facility], [place] [address], [place], [place] [address]    Report Status PENDING  Incomplete  Blood Culture (routine x 2)     Status: None (Preliminary result)   Collection Time: 11/01/19 11:40 PM    Specimen: BLOOD LEFT ARM  Result Value Ref Range Status   Specimen Description   Final    BLOOD LEFT ARM Performed at [facility], [address] [place], [place] [address]    Special Requests   Final    BOTTLES DRAWN AEROBIC AND ANAEROBIC Blood Culture adequate volume Performed at [facility], [address] [place], [place] [address]    Culture   Final    NO GROWTH 2 DAYS Performed at [facility], [place] [address], [place], [place] [address]    Report Status PENDING  Incomplete  SARS CORONAVIRUS 2 (TAT 6-24 HRS) Nasopharyngeal Nasopharyngeal Swab     Status: None   Collection Time: 11/02/19 12:34 AM   Specimen: Nasopharyngeal Swab  Result Value Ref Range Status   SARS Coronavirus 2 NEGATIVE NEGATIVE Final    Comment: (NOTE) SARS-CoV-2 target nucleic acids are NOT DETECTED. The SARS-CoV-2 RNA is generally detectable in upper and lower respiratory specimens during the acute phase of infection. Negative results do not preclude SARS-CoV-2 infection, do not rule out co-infections with other pathogens, and should not be used as the sole basis for treatment or other patient management decisions. Negative results must be combined with clinical observations, patient history, and epidemiological information. The expected result is Negative. Fact Sheet for Patients: SugarRoll.be Fact Sheet for Healthcare Providers: https://www.woods-mathews.com/ This test is not yet approved or cleared by the [place] FDA and  has been authorized for detection and/or diagnosis of SARS-CoV-2 by FDA under an Emergency Use Authorization (EUA). This EUA will remain  in effect (meaning this test can be used) for the duration of the COVID-19 declaration under Section 56 4(b)(1) of the Act, 21 U.S.C. section 360bbb-3(b)(1), unless the authorization is terminated or revoked sooner. Performed at [facility], [place] [address] [place], [place] [address]   MRSA PCR Screening     Status: None   Collection Time: 11/02/19  4:16 AM   Specimen: Nasopharyngeal  Result Value Ref Range Status   MRSA by PCR NEGATIVE NEGATIVE Final    Comment:        The GeneXpert MRSA Assay (FDA approved for NASAL specimens only), is one component of a comprehensive MRSA colonization surveillance program. It is not intended to diagnose MRSA infection nor to guide or monitor treatment for MRSA infections. Performed at [facility], [place] [address], [place], [place] [address]   Urine culture     Status: None   Collection Time: 11/02/19  4:20 AM   Specimen: Urine, Clean Catch  Result Value Ref Range Status   Specimen Description   Final    URINE, CLEAN CATCH Performed at [facility], [address] [place], [place] [address]    Special Requests   Final    NONE Performed at [facility], [address] [place], [place] [address]    Culture   Final    NO GROWTH Performed at [facility], [place] [address], [place], [place] [address]    Report Status 11/03/2019 FINAL  Final      Radiology Studies: No results found.  Scheduled Meds: . atorvastatin  10 mg Oral q1800  . Chlorhexidine Gluconate Cloth  6 each Topical Daily  . divalproex  1,250 mg Oral QPM  . enoxaparin (LOVENOX) injection  40 mg Subcutaneous Q24H  . ipratropium-albuterol  3 mL Nebulization BID  . levothyroxine  112 mcg Oral QAC breakfast  . mouth rinse  15 mL Mouth Rinse BID  . multivitamin  1 tablet Oral Daily  . rivastigmine  13.3 mg Transdermal Daily  . Vitamin D (Ergocalciferol)  50,000 Units Oral Q7 days   Continuous Infusions: . sodium chloride    . ampicillin-sulbactam (UNASYN) IV 3 g (11/04/19 1253)     LOS: 2 days   Time spent: 25 minutes.  Patrecia Pour, MD [facility] [email or web address] 11/04/2019, 3:33 PM

## 2019-11-04 NOTE — Progress Notes (Addendum)
Occupational Therapy Evaluation Patient Details Name: Emily Castaneda MRN: [id number] DOB: 1935-08-14 Today's Date: 11/04/2019    History of Present Illness 85 year old with history of dementia, depression, HLD, hypothyroidism, OSA on CPAP admitted from [facility] to [place] pneumonia for progressive dyspnea on exertion, cough and difficulty swallowing.  PT admitted with suspicion for aspiration pneumonia/sepsis.    Clinical Impression   Pt reports living home with spouse and performing all self-care tasks and functional mobility with Modified Indpendence. Pt's PLOF information may not be accurate due to cognitive deficits and no family present during evaluation. Overall pt required setup to total assist for self-care tasks and Moderate assist for functional mobility with verbal cues on proper body positioning for self-care tasks. O2 stats in the 90s on 2L of portable O2. No LOB episodes. Pt will benefit from continued skilled acute OT services to address deficits for decrease level of assist with self-care tasks and functional mobility.     Follow Up Recommendations  Home health OT;Supervision/Assistance - 24 hour(versus SNF if family is not able to provide 24 hr assistance)    Equipment Recommendations  3 in 1 bedside commode    Recommendations for Other Services       Precautions / Restrictions Precautions Precautions: Fall Restrictions Weight Bearing Restrictions: No      Mobility Bed Mobility Overal bed mobility: Needs Assistance Bed Mobility: Supine to Sit     Supine to sit: HOB elevated;Min assist     General bed mobility comments: cues for use of bed rails and assist to bring LE's to EOB and raise trunk up.  Transfers Overall transfer level: Needs assistance Equipment used: Rolling walker (2 wheeled)   Sit to Stand: Min assist Stand pivot transfers: Min assist       General transfer comment: cues for hand and feet placement with use of RW during functional  mobility    Balance Overall balance assessment: Needs assistance   Sitting balance-Leahy Scale: Fair       Standing balance-Leahy Scale: Fair                             ADL either performed or assessed with clinical judgement   ADL Overall ADL's : Needs assistance/impaired Eating/Feeding: Set up   Grooming: Oral care;Wash/dry face;Wash/dry hands;Set up;Sitting   Upper Body Bathing: Set up   Lower Body Bathing: Maximal assistance   Upper Body Dressing : Minimal assistance   Lower Body Dressing: Maximal assistance   Toilet Transfer: Moderate assistance;RW;BSC   Toileting- Clothing Manipulation and Hygiene: Total assistance   Tub/ Shower Transfer: Moderate assistance   Functional mobility during ADLs: Moderate assistance;Rolling walker;Cueing for safety       Vision Baseline Vision/History: Wears glasses Wears Glasses: At all times       Perception     Praxis      Pertinent Vitals/Pain Pain Assessment: No/denies pain     Hand Dominance Right   Extremity/Trunk Assessment Upper Extremity Assessment Upper Extremity Assessment: RUE deficits/detail;LUE deficits/detail RUE: (Grossly 3+/5, AROM WFL) LUE: (Grossly 3+/5, AROM WFL)   Lower Extremity Assessment Lower Extremity Assessment: Defer to PT evaluation       Communication Communication Communication: No difficulties   Cognition Arousal/Alertness: Awake/alert Behavior During Therapy: WFL for tasks assessed/performed Overall Cognitive Status: History of cognitive impairments - at baseline  General Comments: pt alert to self, place and somewhat to situation   General Comments       Exercises     Shoulder Instructions      Home Living Family/patient expects to be discharged to:: Private residence Living Arrangements: Spouse/significant other Available Help at Discharge: Family;Available 24 hours/day;Available PRN/intermittently Type of  Home: House Home Access: Stairs to enter;Ramped entrance           Bathroom Shower/Tub: Hospital doctor Toilet: Handicapped height Bathroom Accessibility: Yes   Home Equipment: Grab bars - tub/shower;Grab bars - toilet;Shower seat - built in;Walker - 4 wheels;Cane - quad          Prior Functioning/Environment Level of Independence: Independent with assistive device(s);Needs assistance  Gait / Transfers Assistance Needed: p ambulates in home with Rollator and sometimes with quad cane ADL's / Homemaking Assistance Needed: pt reports independence with all ADL's but was uable to perform pericare or don socks during evaluation            OT Problem List: Decreased activity tolerance;Decreased strength;Decreased safety awareness;Decreased knowledge of use of DME or AE;Cardiopulmonary status limiting activity;Decreased cognition;Impaired balance (sitting and/or standing)      OT Treatment/Interventions: Self-care/ADL training;Therapeutic exercise;Energy conservation;Therapeutic activities;Cognitive remediation/compensation;Patient/family education;Balance training;DME and/or AE instruction    OT Goals(Current goals can be found in the care plan section) Acute Rehab OT Goals Patient Stated Goal: to get back home OT Goal Formulation: With patient Time For Goal Achievement: 11/18/19 Potential to Achieve Goals: Good ADL Goals Pt Will Perform Grooming: with supervision;with set-up;standing Pt Will Perform Lower Body Dressing: with min assist Pt Will Transfer to Toilet: with min guard assist Pt Will Perform Toileting - Clothing Manipulation and hygiene: with min guard assist  OT Frequency: Min 2X/week   Barriers to D/C:            Co-evaluation              AM-PAC OT "6 Clicks" Daily Activity     Outcome Measure Help from another person eating meals?: A Little Help from another person taking care of personal grooming?: A Little Help from another person  toileting, which includes using toliet, bedpan, or urinal?: Total Help from another person bathing (including washing, rinsing, drying)?: A Lot Help from another person to put on and taking off regular upper body clothing?: A Little Help from another person to put on and taking off regular lower body clothing?: A Lot 6 Click Score: 14   End of Session Equipment Utilized During Treatment: Gait belt;Rolling walker Nurse Communication: Mobility status  Activity Tolerance: Patient tolerated treatment well Patient left: in chair;with call bell/phone within reach;with chair alarm set  OT Visit Diagnosis: Unsteadiness on feet (R26.81);Muscle weakness (generalized) (M62.81)                Time: [id number] OT Time Calculation (min): 34 min Charges:  OT General Charges $OT Visit: 1 Visit OT Evaluation $OT Eval Moderate Complexity: 1 Mod OT Treatments $Self Care/Home Management : 8-22 mins  Emily Castaneda OTR/L   Jaimen Melone 11/04/2019, 9:31 AM

## 2019-11-05 LAB — CBC
HCT: 31.6 % — ABNORMAL LOW (ref 36.0–46.0)
Hemoglobin: 10.2 g/dL — ABNORMAL LOW (ref 12.0–15.0)
MCH: 32.9 pg (ref 26.0–34.0)
MCHC: 32.3 g/dL (ref 30.0–36.0)
MCV: 101.9 fL — ABNORMAL HIGH (ref 80.0–100.0)
Platelets: 167 10*3/uL (ref 150–400)
RBC: 3.1 MIL/uL — ABNORMAL LOW (ref 3.87–5.11)
RDW: 13.2 % (ref 11.5–15.5)
WBC: 7.8 10*3/uL (ref 4.0–10.5)
nRBC: 0 % (ref 0.0–0.2)

## 2019-11-05 LAB — BASIC METABOLIC PANEL
Anion gap: 8 (ref 5–15)
BUN: 19 mg/dL (ref 8–23)
CO2: 25 mmol/L (ref 22–32)
Calcium: 8.3 mg/dL — ABNORMAL LOW (ref 8.9–10.3)
Chloride: 112 mmol/L — ABNORMAL HIGH (ref 98–111)
Creatinine, Ser: 0.64 mg/dL (ref 0.44–1.00)
GFR calc Af Amer: >60 mL/min (ref >60)
GFR calc non Af Amer: >60 mL/min (ref >60)
Glucose, Bld: 105 mg/dL — ABNORMAL HIGH (ref 70–99)
Potassium: 3.6 mmol/L (ref 3.5–5.1)
Sodium: 145 mmol/L (ref 135–145)

## 2019-11-05 LAB — RETICULOCYTES
Immature Retic Fract: 12.9 % (ref 2.3–15.9)
RBC.: 3.13 MIL/uL — ABNORMAL LOW (ref 3.87–5.11)
Retic Count, Absolute: 36.9 10*3/uL (ref 19.0–186.0)
Retic Ct Pct: 1.2 % (ref 0.4–3.1)

## 2019-11-05 LAB — IRON AND TIBC
Iron: 61 ug/dL (ref 28–170)
Saturation Ratios: 35 % — ABNORMAL HIGH (ref 10.4–31.8)
TIBC: 177 ug/dL — ABNORMAL LOW (ref 250–450)
UIBC: 116 ug/dL

## 2019-11-05 LAB — MAGNESIUM: Magnesium: 1.9 mg/dL (ref 1.7–2.4)

## 2019-11-05 LAB — VITAMIN B12: Vitamin B-12: 901 pg/mL (ref 180–914)

## 2019-11-05 LAB — FOLATE: Folate: 7.2 ng/mL (ref >5.9)

## 2019-11-05 LAB — FERRITIN: Ferritin: 685 ng/mL — ABNORMAL HIGH (ref 11–307)

## 2019-11-05 MED ORDER — AMOXICILLIN-POT CLAVULANATE 875-125 MG PO TABS: 1.0000 | ORAL_TABLET | Freq: Two times a day (BID) | ORAL | 0 refills | Status: AC

## 2019-11-05 NOTE — Discharge Summary (Signed)
Physician Discharge Summary  Emily Castaneda WYO:[id number] DOB: 29-Apr-1935 DOA: 11/01/2019  PCP: Ronnald Nian, DO  Admit date: 11/01/2019 Discharge date: 11/05/2019  Admitted From: Home Disposition: Home   Recommendations for Outpatient Follow-up:  1. Follow up with PCP in 1-2 weeks  Home Health: Pt, OT Equipment/Devices:  3 in 1 Discharge Condition: Stable CODE STATUS: DNR Diet recommendation: Heart healthy  Brief/Interim Summary: Emily Castaneda is an 85 y.o. female with a history of HLD, hypothyroidism, OSA on CPAP, and depression who presented due to low oxygen on pulse oximetry at home the day after an aspiration event. CT demonstrated LLL infiltrate as well as other scattered opacities, covid negative, and unasyn started for suspected aspiration pneumonia. Dysphagia diet was recommended by SLP. Leukocytosis resolved, SLP has signed off with improvement in dysphagia, and she is no longer hypoxemic even with exertion.  Discharge Diagnoses:  Principal Problem:   Acute respiratory failure ([place]) Active Problems:   Hypothyroidism   Bipolar 1 disorder, depressed ([place])   Aspiration pneumonia ([place])  Acute hypoxemic respiratory failure and sepsis due to aspiration pneumonia, dysphagia: No PE on CTA chest, PCT severely elevated, leukocytosis has improved. Covid negative.  - Remained >93% on room air with ambulation on the morning of discharge, feels subjectively much better.  - Normal diet per SLP who has signed off. - Continue antibiotics with augmentin (given unasyn while admitted).    Macrocytic anemia: Hgb stabilizing at 10.2g/dl. Possibly nutritionally mediated and due to acute illness. D74 and folic acid not low, ferritin elevated with normal iron. - Monitor at follow up.  Obesity: BMI Body mass index is 34.52 kg/m.   Bipolar disorder, depression, dementia: Normal mood currently.  - Continue home medications  HLD:  - Continue statin  Hypothyroidism: TSH wnl. -  Continue synthroid   Discharge Instructions Discharge Instructions    Diet - low sodium heart healthy   Complete by: As directed    Discharge instructions   Complete by: As directed    You were admitted for respiratory failure due to aspiration pneumonia which has improved. Your vital signs and labs have shown sustained improvement and you no longer require supplemental oxygen even with exertion. You are stable to return home with home health services and to continue antibiotics (augmentin sent to your pharmacy) for 3 more days. If your symptoms return, seek medical attention right away.   Increase activity slowly   Complete by: As directed      Allergies as of 11/05/2019   No Known Allergies     Medication List    TAKE these medications   amoxicillin-clavulanate 875-125 MG tablet Commonly known as: Augmentin Take 1 tablet by mouth 2 (two) times daily for 3 days.   atorvastatin 10 MG tablet Commonly known as: LIPITOR TAKE 1 TABLET BY MOUTH DAILY What changed: when to take this   divalproex 250 MG 24 hr tablet Commonly known as: DEPAKOTE ER Take 1,250 mg by mouth every evening.   levothyroxine 112 MCG tablet Commonly known as: SYNTHROID TAKE 1 TABLET(112 MCG) BY MOUTH DAILY   PreserVision AREDS 2 Caps Take 1 capsule by mouth 2 (two) times daily.   rivastigmine 13.3 MG/24HR Commonly known as: EXELON Place 13.3 mg onto the skin daily.   TURMERIC CURCUMIN PO Take by mouth.   Vitamin D (Ergocalciferol) 1.25 MG (50000 UNIT) Caps capsule Commonly known as: DRISDOL TAKE 1 CAPSULE BY MOUTH EVERY 7 DAYS What changed: See the new instructions.   Vitamin D3 50  MCG (2000 UT) Tabs Take 1 capsule by mouth daily.            Durable Medical Equipment  (From admission, onward)         Start     Ordered   11/05/19 1131  For home use only DME 3 n 1  Once     11/05/19 1130         Follow-up Information    Ronnald Nian, DO. Schedule an appointment as soon as  possible for a visit in 1 week(s).   Specialty: Family Medicine Contact information: [place] [address] [phone]          No Known Allergies  Consultations:  None  Procedures/Studies: CT ANGIO CHEST PE W OR WO CONTRAST  Result Date: 11/02/2019 CLINICAL DATA:  Shortness of breath since yesterday morning, progressively worsened, productive cough, denies chest pain; history of dementia, hypothyroidism, former smoker EXAM: CT ANGIOGRAPHY CHEST WITH CONTRAST TECHNIQUE: Multidetector CT imaging of the chest was performed using the standard protocol during bolus administration of intravenous contrast. Multiplanar CT image reconstructions and MIPs were obtained to evaluate the vascular anatomy. CONTRAST:  132m OMNIPAQUE IOHEXOL 350 MG/ML SOLN IV COMPARISON:  None FINDINGS: Cardiovascular: Mild atherosclerotic calcifications aorta, proximal great vessels, minimally coronary arteries. Aorta normal caliber without aneurysm or dissection. Heart unremarkable without pericardial effusion. Pulmonary arteries adequately opacified Mediastinum/Nodes: Base of cervical region normal appearance. Few normal size mediastinal lymph nodes without thoracic adenopathy. Esophagus unremarkable. Lungs/Pleura: Focus of ground-glass opacity in RIGHT upper lobe image 50, 9 x 6 mm. Subsegmental atelectasis RIGHT lower lobe. Consolidation of LEFT lower lobe. Additional patchy scattered infiltrates in LEFT upper lobe. Findings favor multifocal pneumonia. No pleural effusion or pneumothorax. Upper Abdomen: Small cyst LEFT lobe liver 17 x 10 mm image 84. Remaining visualized upper abdomen unremarkable. Musculoskeletal: Minimal scattered degenerative disc disease changes thoracic spine. No acute osseous findings. Review of the MIP images confirms the above findings. IMPRESSION: No evidence of pulmonary embolism. Consolidation of LEFT lower lobe with additional patchy infiltrates in LEFT upper lobe  favoring multifocal pneumonia. Additional focus of questionable infiltrate versus ground-glass opacity RIGHT upper lobe 9 x 6 mm; short-term follow-up CT recommended in 3 months to assess for persistence. Aortic Atherosclerosis (ICD10-I70.0). Electronically Signed   By: MLavonia DanaM.D.   On: 11/02/2019 15:34   DG Chest Portable 1 View  Result Date: 11/01/2019 CLINICAL DATA:  Shortness of breath. Progressive shortness of breath today. Respiratory difficulty. EXAM: PORTABLE CHEST 1 VIEW COMPARISON:  11/23/2013 FINDINGS: The cardiomediastinal contours are normal. Mild bronchial and slightly interstitial thickening. No consolidation, pleural effusion, or pneumothorax. No acute osseous abnormalities are seen. IMPRESSION: Mild bronchial and interstitial thickening, may reflect bronchitis or pulmonary edema. Bronchitis is favored given normal heart size. Electronically Signed   By: MKeith RakeM.D.   On: 11/01/2019 23:30    Subjective: Feels well, wants to go home, walked without oxygen with no dyspnea this AM.  Discharge Exam: Vitals:   11/05/19 0640 11/05/19 0945  BP: (!) 143/67   Pulse: 76   Resp: 18   Temp: 97.8 F (36.6 C)   SpO2: 96% 92%   General: Pt is alert, awake, not in acute distress Cardiovascular: RRR, S1/S2 +, no rubs, no gallops Respiratory: CTA bilaterally, no wheezing, no rhonchi Abdominal: Soft, NT, ND, bowel sounds + Extremities: No edema, no cyanosis  Labs: BNP (last 3 results) Recent Labs    11/01/19 2303 11/02/19 04128  BNP 148.2* 694.5*   Basic Metabolic Panel: Recent Labs  Lab 11/01/19 2303 11/02/19 0611 11/03/19 0340 11/04/19 0514 11/05/19 0540  NA 139 137 141 144 145  K 4.0 4.2 3.7 3.9 3.6  CL 104 101 109 111 112*  CO2 '24 26 25 25 25  '$ GLUCOSE 136* 151* 122* 102* 105*  BUN '22 19 18 23 19  '$ CREATININE 0.90 0.83 0.83 0.74 0.64  CALCIUM 9.3 9.1 8.3* 8.6* 8.3*  MG  --   --   --  2.1 1.9   Liver Function Tests: Recent Labs  Lab 11/02/19 0611   AST 33  ALT 22  ALKPHOS 48  BILITOT 0.8  PROT 6.4*  ALBUMIN 3.0*   No results for input(s): LIPASE, AMYLASE in the last 168 hours. No results for input(s): AMMONIA in the last 168 hours. CBC: Recent Labs  Lab 11/01/19 2303 11/02/19 0611 11/03/19 0340 11/04/19 0514 11/05/19 0540  WBC 20.5* 12.8* 10.5 11.7* 7.8  NEUTROABS 16.5* 10.9*  --   --   --   HGB 12.2 11.8* 10.3* 9.9* 10.2*  HCT 36.4 37.1 31.6* 30.9* 31.6*  MCV 99.7 102.8* 102.9* 102.7* 101.9*  PLT 196 183 152 166 167   Cardiac Enzymes: No results for input(s): CKTOTAL, CKMB, CKMBINDEX, TROPONINI in the last 168 hours. BNP: Invalid input(s): POCBNP CBG: Recent Labs  Lab 11/02/19 0845 11/02/19 1537 11/02/19 2348 11/03/19 0742  GLUCAP 131* 111* 105* 106*   D-Dimer No results for input(s): DDIMER in the last 72 hours. Hgb A1c No results for input(s): HGBA1C in the last 72 hours. Lipid Profile No results for input(s): CHOL, HDL, LDLCALC, TRIG, CHOLHDL, LDLDIRECT in the last 72 hours. Thyroid function studies No results for input(s): TSH, T4TOTAL, T3FREE, THYROIDAB in the last 72 hours.  Invalid input(s): FREET3 Anemia work up Recent Labs    11/05/19 0540  VITAMINB12 901  FOLATE 7.2  FERRITIN 685*  TIBC 177*  IRON 61  RETICCTPCT 1.2   Urinalysis    Component Value Date/Time   COLORURINE YELLOW 11/02/2019 0420   APPEARANCEUR HAZY (A) 11/02/2019 0420   LABSPEC 1.020 11/02/2019 0420   PHURINE 6.5 11/02/2019 0420   GLUCOSEU NEGATIVE 11/02/2019 0420   GLUCOSEU NEGATIVE 03/23/2019 1451   HGBUR NEGATIVE 11/02/2019 0420   BILIRUBINUR NEGATIVE 11/02/2019 0420   BILIRUBINUR NEGATIVE 03/23/2019 1457   KETONESUR 15 (A) 11/02/2019 0420   PROTEINUR NEGATIVE 11/02/2019 0420   UROBILINOGEN 0.2 03/23/2019 1457   UROBILINOGEN 0.2 03/23/2019 1451   NITRITE NEGATIVE 11/02/2019 0420   LEUKOCYTESUR NEGATIVE 11/02/2019 0420    Microbiology Recent Results (from the past 240 hour(s))  SARS Coronavirus 2 Ag (30  min TAT) - Nasal Swab (BD Veritor Kit)     Status: None   Collection Time: 11/01/19 11:03 PM   Specimen: Nasal Swab (BD Veritor Kit)  Result Value Ref Range Status   SARS Coronavirus 2 Ag NEGATIVE NEGATIVE Final    Comment: (NOTE) SARS-CoV-2 antigen NOT DETECTED.  Negative results are presumptive.  Negative results do not preclude SARS-CoV-2 infection and should not be used as the sole basis for treatment or other patient management decisions, including infection  control decisions, particularly in the presence of clinical signs and  symptoms consistent with COVID-19, or in those who have been in contact with the virus.  Negative results must be combined with clinical observations, patient history, and epidemiological information. The expected result is Negative. Fact Sheet for Patients: PodPark.tn Fact Sheet for Healthcare Providers: GiftContent.is This test is  not yet approved or cleared by the [place] and  has been authorized for detection and/or diagnosis of SARS-CoV-2 by FDA under an Emergency Use Authorization (EUA).  This EUA will remain in effect (meaning this test can be used) for the duration of  the COVID-19 de claration under Section 564(b)(1) of the Act, 21 U.S.C. section 360bbb-3(b)(1), unless the authorization is terminated or revoked sooner. Performed at [facility], [address] [place], [place] [address]   Blood Culture (routine x 2)     Status: None (Preliminary result)   Collection Time: 11/01/19 11:20 PM   Specimen: BLOOD RIGHT HAND  Result Value Ref Range Status   Specimen Description   Final    BLOOD RIGHT HAND Performed at [facility], [address] [place], [place] [address]    Special Requests   Final    BOTTLES DRAWN AEROBIC AND ANAEROBIC Blood Culture results may not be optimal due to an inadequate volume of blood received in culture bottles Performed at  [facility], [address] [place], [place] [address]    Culture   Final    NO GROWTH 3 DAYS Performed at [facility], [place] [address] [place], [place] [address]    Report Status PENDING  Incomplete  Blood Culture (routine x 2)     Status: None (Preliminary result)   Collection Time: 11/01/19 11:40 PM   Specimen: BLOOD LEFT ARM  Result Value Ref Range Status   Specimen Description   Final    BLOOD LEFT ARM Performed at [facility], [address] [place], [place] [address]    Special Requests   Final    BOTTLES DRAWN AEROBIC AND ANAEROBIC Blood Culture adequate volume Performed at [facility], [address] [place], [place] [address]    Culture   Final    NO GROWTH 3 DAYS Performed at [facility], [place] [address] [place], [place] [address]    Report Status PENDING  Incomplete  SARS CORONAVIRUS 2 (TAT 6-24 HRS) Nasopharyngeal Nasopharyngeal Swab     Status: None   Collection Time: 11/02/19 12:34 AM   Specimen: Nasopharyngeal Swab  Result Value Ref Range Status   SARS Coronavirus 2 NEGATIVE NEGATIVE Final    Comment: (NOTE) SARS-CoV-2 target nucleic acids are NOT DETECTED. The SARS-CoV-2 RNA is generally detectable in upper and lower respiratory specimens during the acute phase of infection. Negative results do not preclude SARS-CoV-2 infection, do not rule out co-infections with other pathogens, and should not be used as the sole basis for treatment or other patient management decisions. Negative results must be combined with clinical observations, patient history, and epidemiological information. The expected result is Negative. Fact Sheet for Patients: SugarRoll.be Fact Sheet for Healthcare Providers: https://www.woods-mathews.com/ This test is not yet approved or cleared by the [place] FDA and  has been authorized for detection and/or diagnosis of SARS-CoV-2 by FDA under  an Emergency Use Authorization (EUA). This EUA will remain  in effect (meaning this test can be used) for the duration of the COVID-19 declaration under Section 56 4(b)(1) of the Act, 21 U.S.C. section 360bbb-3(b)(1), unless the authorization is terminated or revoked sooner. Performed at [facility], [place] [address] [place],  [address]   MRSA PCR Screening     Status: None   Collection Time: 11/02/19  4:16 AM   Specimen: Nasopharyngeal  Result Value  Ref Range Status   MRSA by PCR NEGATIVE NEGATIVE Final    Comment:        The GeneXpert MRSA Assay (FDA approved for NASAL specimens only), is one component of a comprehensive MRSA colonization surveillance program. It is not intended to diagnose MRSA infection nor to guide or monitor treatment for MRSA infections. Performed at [facility], [place] [address], [place], [place] [address]   Urine culture     Status: None   Collection Time: 11/02/19  4:20 AM   Specimen: Urine, Clean Catch  Result Value Ref Range Status   Specimen Description   Final    URINE, CLEAN CATCH Performed at [facility], [address] [place], [place] [address]    Special Requests   Final    NONE Performed at [facility], [address] [place], [place] [address]    Culture   Final    NO GROWTH Performed at [facility], [place] [address] [place], [place] [address]    Report Status 11/03/2019 FINAL  Final    Time coordinating discharge: Approximately 40 minutes  Patrecia Pour, MD  [facility] 11/05/2019, 11:37 AM

## 2019-11-05 NOTE — TOC Initial Note (Signed)
Transition of Care [facility]) - Initial/Assessment Note    Patient Details  Name: Emily Castaneda MRN: [id number] Date of Birth: [birth year]  Transition of Care [facility]) CM/SW Contact:    Lynnell Catalan, RN Phone Number: 11/05/2019, 12:33 PM  Clinical Narrative:                 Pt to dc back home with husband today. Husband states that pt has a RW at home and does not need a 3in1. Choice offered for HHPT/OT and [facility] chosen. [facility] rep contacted for referral.  Expected Discharge Plan: [place] Services Barriers to Discharge: No Barriers Identified   Patient Goals and CMS Choice   CMS Medicare.gov Compare Post Acute Care list provided to:: Patient Represenative (must comment)(husband) Choice offered to / list presented to : Spouse  Expected Discharge Plan and Services Expected Discharge Plan: [place]   Discharge Planning Services: CM Consult Post Acute Care Choice: [place] arrangements for the past 2 months: Single Family Home Expected Discharge Date: 11/05/19                         HH Arranged: PT, OT HH Agency: [facility] Date [facility] Agency Contacted: 11/05/19 Time HH Agency Contacted: 71 Representative spoke with at [place]: Tommi Rumps  Prior Living Arrangements/Services Living arrangements for the past 2 months: [place] Lives with:: Spouse Patient language and need for interpreter reviewed:: Yes Do you feel safe going back to the place where you live?: Yes      Need for Family Participation in Patient Care: Yes (Comment) Care giver support system in place?: Yes (comment)      Activities of Daily Living      Permission Sought/Granted Permission sought to share information with : Investment banker, corporate granted to share info w AGENCY: [facility]        Emotional Assessment Appearance:: Appears stated age     Orientation: : Oriented to Self      Admission diagnosis:  Acute  respiratory failure ([place]) [J96.00] Hypoxia [R09.02] Community acquired pneumonia, unspecified laterality [J18.9] Patient Active Problem List   Diagnosis Date Noted  . Acute respiratory failure ([place]) 11/02/2019  . Aspiration pneumonia ([place]) 11/02/2019  . Venous insufficiency 03/26/2019  . Edema leg 03/20/2019  . Absence of bladder continence 03/20/2019  . Medication side effect 03/20/2019  . Unstable gait 05/19/2018  . Vitamin D deficiency, unspecified 05/19/2018  . Degenerative arthritis of right knee 10/07/2017  . Morbid obesity ([place]) 06/03/2017  . Degenerative arthritis of left knee 12/19/2016  . Polymyalgia ([place]) 12/19/2016  . Acute bronchiolitis 11/14/2013  . VULVAR ABSCESS 02/28/2010  . Depression 02/26/2008  . Hypothyroidism 02/26/2007  . Dyslipidemia 02/26/2007  . Bipolar 1 disorder, depressed ([place]) 02/26/2007  . Diverticulosis of large intestine 02/26/2007  . MENOPAUSAL SYNDROME 02/26/2007  . History of colonic polyps 02/26/2007   PCP:  Ronnald Nian, DO Pharmacy:   [facility] [phone] [address], [address] AT [facility] [address] [place] [address] Phone: [phone] Fax: [phone]     Social Determinants of Health ([place]) Interventions    Readmission Risk Interventions No flowsheet data found.

## 2019-11-05 NOTE — Care Management Important Message (Signed)
Important Message  Patient Details IM Letter given to Marney Doctor RN Case Manager to present to the Patient Name: Emily Castaneda MRN: [id number] Date of Birth: [birth year]   Medicare Important Message Given:  Yes     Kerin Salen 11/05/2019, 10:12 AM

## 2019-11-05 NOTE — Progress Notes (Signed)
SATURATION QUALIFICATIONS: (This note is used to comply with regulatory documentation for home oxygen)  Patient Saturations on Room Air at Rest = 93%  Patient Saturations on Room Air while Ambulating = 94%  Patient Saturations on 2 Liters of oxygen while Ambulating = 93%  Please briefly explain why patient needs home oxygen:saturation above 90%.

## 2019-11-06 ENCOUNTER — Telehealth: Payer: Self-pay | Admitting: *Deleted

## 2019-11-06 NOTE — Telephone Encounter (Signed)
Unable to reach pt. 1st attempt.  

## 2019-11-07 LAB — CULTURE, BLOOD (ROUTINE X 2)
Culture: NO GROWTH
Culture: NO GROWTH
Special Requests: ADEQUATE

## 2019-11-09 ENCOUNTER — Telehealth: Payer: Self-pay | Admitting: Family Medicine

## 2019-11-09 NOTE — Telephone Encounter (Signed)
Emily Castaneda is calling and wanted to let Dr. Loletha Grayer know that they have received a referral for home health and that referral has been delayed until 3/24/ due to patient just coming out of the hospital. CB is [phone]

## 2019-11-09 NOTE — Telephone Encounter (Signed)
FYI

## 2019-11-11 ENCOUNTER — Telehealth: Payer: Self-pay | Admitting: Family Medicine

## 2019-11-11 DIAGNOSIS — J690 Pneumonitis due to inhalation of food and vomit: Secondary | ICD-10-CM | POA: Diagnosis not present

## 2019-11-11 DIAGNOSIS — M5134 Other intervertebral disc degeneration, thoracic region: Secondary | ICD-10-CM | POA: Diagnosis not present

## 2019-11-11 DIAGNOSIS — J9601 Acute respiratory failure with hypoxia: Secondary | ICD-10-CM | POA: Diagnosis not present

## 2019-11-11 NOTE — Telephone Encounter (Signed)
Clair Gulling is calling and wanted to let Dr. Loletha Grayer know that an evaluation was preformed and he recommends PT 1x1, 2 week 4, 1 week 4 and a referral for speech therapy for swallowing. Patient has minor swelling plus 1 edema at the ankles and there is one medication that was still in the home that was on the discharge list from the hospital list (vitamin d 50,000 units). CB is [phone]

## 2019-11-12 ENCOUNTER — Other Ambulatory Visit: Payer: Self-pay

## 2019-11-12 NOTE — Telephone Encounter (Signed)
Pt has appt w/ me tomorrow 3/26

## 2019-11-13 ENCOUNTER — Other Ambulatory Visit: Payer: Self-pay | Admitting: Family Medicine

## 2019-11-13 ENCOUNTER — Ambulatory Visit (INDEPENDENT_AMBULATORY_CARE_PROVIDER_SITE_OTHER): Payer: Medicare Other | Admitting: Family Medicine

## 2019-11-13 ENCOUNTER — Encounter: Payer: Self-pay | Admitting: Family Medicine

## 2019-11-13 VITALS — BP 140/80 | HR 86 | Temp 97.8°F | Ht 62.0 in | Wt 181.8 lb

## 2019-11-13 DIAGNOSIS — Z09 Encounter for follow-up examination after completed treatment for conditions other than malignant neoplasm: Secondary | ICD-10-CM

## 2019-11-13 DIAGNOSIS — J690 Pneumonitis due to inhalation of food and vomit: Secondary | ICD-10-CM

## 2019-11-13 DIAGNOSIS — R600 Localized edema: Secondary | ICD-10-CM

## 2019-11-13 DIAGNOSIS — J9601 Acute respiratory failure with hypoxia: Secondary | ICD-10-CM | POA: Diagnosis not present

## 2019-11-13 MED ORDER — FUROSEMIDE 20 MG PO TABS: 20.0000 mg | ORAL_TABLET | Freq: Every day | ORAL | 0 refills | Status: DC | PRN

## 2019-11-13 NOTE — Progress Notes (Signed)
Emily Castaneda is a 85 y.o. female  Chief Complaint  Patient presents with  . Hospitalization Follow-up    Pt was released last week 11/02/2019.  Pt c/o loss of appitate and feet ankle swelling.    HPI: Emily Castaneda is a 85 y.o. female here with her husband for hospital f/u appt. Pt was admitted to [facility] after being transferred from [facility] on 11/01/19 with aspiration pneumonia and acute respiratory failure. She was treated with IV abx while inpatient and then continued on augmentin x 3 days as outpatient. She was discharged home on 11/05/19. Pt set up with home health services thru [place]. Pt will be having PT and a swallowing eval thru ST was recommended.  Today, pt and husband concerned about  1. B/L LE edema - chronic, unsure if worse than baseline but thinks it may be worse 2. Decreased appetite - not hungry and she is having trouble swallowing solids. She is going to have swallowing eval   Past Medical History:  Diagnosis Date  . Depression    Dr. Abner Greenspan  . Diverticulosis of colon   . Endometriosis   . Hx of colonic polyps   . Hyperlipidemia   . Hypothyroidism   . Manic, depressive ([place])   . Menopausal syndrome   . OSA (obstructive sleep apnea)    cpap; 17 cm (clint young)    Past Surgical History:  Procedure Laterality Date  . APPENDECTOMY    . COLONOSCOPY    . POLYPECTOMY     Dr. Ubaldo Glassing  . TONSILLECTOMY AND ADENOIDECTOMY      Social History   Socioeconomic History  . Marital status: Married    Spouse name: Not on file  . Number of children: Not on file  . Years of education: Not on file  . Highest education level: Not on file  Occupational History  . Occupation: retired  Tobacco Use  . Smoking status: Former Smoker    Packs/day: 0.10    Years: 10.00    Pack years: 1.00    Types: Cigarettes    Quit date: 08/21/1983    Years since quitting: 36.2  . Smokeless tobacco: Never Used  . Tobacco comment: only smoked socially  Substance and Sexual Activity    . Alcohol use: Yes    Alcohol/week: 1.0 - 2.0 standard drinks    Types: 1 - 2 Standard drinks or equivalent per week    Comment: occ glass of wine   . Drug use: No  . Sexual activity: Not on file  Other Topics Concern  . Not on file  Social History Narrative  . Not on file   Social Determinants of Health   Financial Resource Strain: Low Risk   . Difficulty of Paying Living Expenses: Not hard at all  Food Insecurity: No Food Insecurity  . Worried About Charity fundraiser in the Last Year: Never true  . Ran Out of Food in the Last Year: Never true  Transportation Needs: No Transportation Needs  . Lack of Transportation (Medical): No  . Lack of Transportation (Non-Medical): No  Physical Activity:   . Days of Exercise per Week:   . Minutes of Exercise per Session:   Stress:   . Feeling of Stress :   Social Connections:   . Frequency of Communication with Friends and Family:   . Frequency of Social Gatherings with Friends and Family:   . Attends Religious Services:   . Active Member of Clubs or Organizations:   .  Attends Archivist Meetings:   Marland Kitchen Marital Status:   Intimate Partner Violence:   . Fear of Current or Ex-Partner:   . Emotionally Abused:   Marland Kitchen Physically Abused:   . Sexually Abused:     Family History  Problem Relation Age of Onset  . Diabetes Mother   . Heart disease Father        complications of coronary heart disease  . Diabetes Brother   . Heart disease Other   . Cancer Other        BREAST AND LUNG   . Breast cancer Maternal Aunt   . Cancer Maternal Grandfather        LUNG  . Breast cancer Daughter   . Colon cancer Neg Hx   . Rectal cancer Neg Hx   . Stomach cancer Neg Hx      Immunization History  Administered Date(s) Administered  . Influenza Split 09/20/2013  . Influenza, High Dose Seasonal PF 08/07/2016, 09/18/2017, 06/13/2018  . PFIZER SARS-COV-2 Vaccination 10/25/2019  . Pneumococcal Conjugate-13 09/11/2013  . Pneumococcal  Polysaccharide-23 02/25/2009  . Td 02/25/2009  . Zoster 12/10/2012    Outpatient Encounter Medications as of 11/13/2019  Medication Sig Note  . atorvastatin (LIPITOR) 10 MG tablet TAKE 1 TABLET BY MOUTH DAILY (Patient taking differently: Take 10 mg by mouth daily at 6 PM. )   . Cholecalciferol (VITAMIN D3) 2000 units TABS Take 1 capsule by mouth daily.   . divalproex (DEPAKOTE ER) 250 MG 24 hr tablet Take 1,250 mg by mouth every evening.   Marland Kitchen levothyroxine (SYNTHROID) 112 MCG tablet TAKE 1 TABLET(112 MCG) BY MOUTH DAILY (Patient taking differently: Take 112 mcg by mouth daily before breakfast. )   . Multiple Vitamins-Minerals (PRESERVISION AREDS 2) CAPS Take 1 capsule by mouth 2 (two) times daily.   . rivastigmine (EXELON) 13.3 MG/24HR Place 13.3 mg onto the skin daily.    . TURMERIC CURCUMIN PO Take by mouth.   . Vitamin D, Ergocalciferol, (DRISDOL) 50000 units CAPS capsule TAKE 1 CAPSULE BY MOUTH EVERY 7 DAYS (Patient taking differently: Take 50,000 Units by mouth every 7 (seven) days. ) 11/02/2019: Takes on mondays   No facility-administered encounter medications on file as of 11/13/2019.     ROS: Pertinent positives and negatives noted in HPI. Remainder of ROS non-contributory  No Known Allergies  BP 140/80 (BP Location: Left Arm, Patient Position: Sitting, Cuff Size: Normal)   Pulse 86   Temp 97.8 F (36.6 C) (Temporal)   Ht 5\' 2"  (1.575 m)   Wt 181 lb 12.8 oz (82.5 kg)   PF 97 L/min   BMI 33.25 kg/m   Physical Exam  Constitutional: She appears well-developed and well-nourished. No distress.  Cardiovascular: Normal rate, regular rhythm and intact distal pulses.  Pulmonary/Chest: Effort normal and breath sounds normal. No respiratory distress. She has no wheezes. She has no rhonchi. She has no rales.  Abdominal: Soft. Bowel sounds are normal. She exhibits no distension.  Musculoskeletal:        General: Edema (1+ B/L pedal edema, no erythema, TTP) present. No tenderness.    Neurological: She is alert.  Psychiatric: She has a normal mood and affect.     A/P:  1. Hospital discharge follow-up 2. Aspiration pneumonia of both lungs, unspecified aspiration pneumonia type, unspecified part of lung ([place]) 3. Acute respiratory failure with hypoxia (HCC) - improved, done with abx - home PT and ST coming and will have swallowing eval next week - recommend  Ensure or other supplement   4. Bilateral leg edema Rx: - furosemide (LASIX) 20 MG tablet; Take 1 tablet (20 mg total) by mouth daily as needed.  Dispense: 30 tablet; Refill: 0 - pt to take daily 3-4 days then stop  Discussed plan and reviewed medications with patient, including risks, benefits, and potential side effects. Pt expressed understand. All questions answered.   This visit occurred during the SARS-CoV-2 public health emergency.  Safety protocols were in place, including screening questions prior to the visit, additional usage of staff PPE, and extensive cleaning of exam room while observing appropriate contact time as indicated for disinfecting solutions.

## 2019-11-13 NOTE — Patient Instructions (Addendum)
Meal/nutrition supplementation: Ensure Carnation instant breakfast shakes   For the swelling in your legs, take  Lasix (furosemide) 20mg  1 tab daily in AM x 3-4 days then stop

## 2019-11-16 DIAGNOSIS — M5134 Other intervertebral disc degeneration, thoracic region: Secondary | ICD-10-CM | POA: Diagnosis not present

## 2019-11-16 DIAGNOSIS — J690 Pneumonitis due to inhalation of food and vomit: Secondary | ICD-10-CM | POA: Diagnosis not present

## 2019-11-16 DIAGNOSIS — J9601 Acute respiratory failure with hypoxia: Secondary | ICD-10-CM | POA: Diagnosis not present

## 2019-11-17 ENCOUNTER — Telehealth: Payer: Self-pay | Admitting: Family Medicine

## 2019-11-17 DIAGNOSIS — J690 Pneumonitis due to inhalation of food and vomit: Secondary | ICD-10-CM | POA: Diagnosis not present

## 2019-11-17 DIAGNOSIS — J9601 Acute respiratory failure with hypoxia: Secondary | ICD-10-CM | POA: Diagnosis not present

## 2019-11-17 DIAGNOSIS — M5134 Other intervertebral disc degeneration, thoracic region: Secondary | ICD-10-CM | POA: Diagnosis not present

## 2019-11-17 NOTE — Telephone Encounter (Signed)
Clair Gulling is calling and wanted to let Dr. Loletha Grayer know that patients BP is elevated at 160/90. HR is elevated at 100 at rest and when she walks to the bathroom it goes up to 140 and pulse remains regular. After 2 days without a bowel movement patient started taking smooth lax. Appetite remains poor. Patient is not tolerating ensure due to no taste desire. They are working on getting instant carnation breakfast. CB is [phone]

## 2019-11-17 NOTE — Telephone Encounter (Signed)
If pt is not eating much, she may not have BM daily or with the same frequency she was prior to hospitalization. Is patient drinking fluids? There is a concern for dehydration if she is not taking enough in, especially with her HR elevated at 100-140. Her baseline is 76-86bpm. She needs to drink the ensure or other supplement even if she doesn't feel hungry or have an appetite. Continue to check BP and HR 2x/day and encourage PO intake. If no improvement in 1-2 days or if symptoms worsen, pt needs to be seen in UC or ER and likely needs IVF.

## 2019-11-17 NOTE — Telephone Encounter (Signed)
Please see message and advise.  Thank you. ° °

## 2019-11-18 DIAGNOSIS — J690 Pneumonitis due to inhalation of food and vomit: Secondary | ICD-10-CM | POA: Diagnosis not present

## 2019-11-18 DIAGNOSIS — J9601 Acute respiratory failure with hypoxia: Secondary | ICD-10-CM | POA: Diagnosis not present

## 2019-11-18 DIAGNOSIS — M5134 Other intervertebral disc degeneration, thoracic region: Secondary | ICD-10-CM | POA: Diagnosis not present

## 2019-11-18 NOTE — Telephone Encounter (Signed)
Megace, to the best of my knowledge and research, has not been shown in studies to increase weight in elderly pts with unintentional weight loss. No nutritional or clinically significant beneficial outcomes have been demonstrated. I have only seen Marinol used in oncology pts and would not be comfortable prescribing for this patient. I feel patient needs swallowing eval which is scheduled for early next week and then we can proceed from there with dietary recommendations. In the interim, encourage fluid intake and supplement intake. Pt should be encouraged to try small amounts frequently.

## 2019-11-18 NOTE — Telephone Encounter (Signed)
I called and spoke with pt's husband informing him of Dr. Lurline Del message/feedback from call.  Husband said that pt did not like the ensure but he is giving her other fluids, water, coke, ginger ale.  I informed Maggio of concerns of dehydration and taking pt's Bp and HR 2x a day. Mr. Macadangdang informed me that Alvis Lemmings called regarding pt taking a appetite enhancer.  Please advise.

## 2019-11-18 NOTE — Telephone Encounter (Signed)
Deirdre is calling back from [place] regarding patient appetite and would like a call back. CB is [phone]

## 2019-11-19 NOTE — Telephone Encounter (Signed)
I spoke with pt's husband and informed him of Dr. Bebe Shaggy feedback, also informed him if pt worsens in symptoms then to proceed to UC or ER.  Pt has an appointment for next Friday to see Dr. Loletha Grayer, in office.

## 2019-11-19 NOTE — Telephone Encounter (Signed)
I spoke with husband informing him of Dr. Lurline Del message.  He informed me that he is concerned about pt's elevated bp, it was 154/94: HR 100 this morning,  he said pt did eat cereal w/blueberries, milk and orange juice.  Pt also saw the speech therapist today.  Husband asked me to ask Dr. Ethelene Hal to get his professional opinion, as Dr. Loletha Grayer is out of office.  I informed him that I would send Dr. Ethelene Hal this message and get back with him when I can.  Please advise.

## 2019-11-19 NOTE — Telephone Encounter (Signed)
BP is a little elevated. She should avoid salt. Follow up with Dr. Loletha Grayer.

## 2019-11-20 DIAGNOSIS — J690 Pneumonitis due to inhalation of food and vomit: Secondary | ICD-10-CM | POA: Diagnosis not present

## 2019-11-20 DIAGNOSIS — J9601 Acute respiratory failure with hypoxia: Secondary | ICD-10-CM | POA: Diagnosis not present

## 2019-11-20 DIAGNOSIS — M5134 Other intervertebral disc degeneration, thoracic region: Secondary | ICD-10-CM | POA: Diagnosis not present

## 2019-11-24 ENCOUNTER — Telehealth: Payer: Self-pay

## 2019-11-24 DIAGNOSIS — J690 Pneumonitis due to inhalation of food and vomit: Secondary | ICD-10-CM | POA: Diagnosis not present

## 2019-11-24 DIAGNOSIS — M5134 Other intervertebral disc degeneration, thoracic region: Secondary | ICD-10-CM | POA: Diagnosis not present

## 2019-11-24 DIAGNOSIS — J9601 Acute respiratory failure with hypoxia: Secondary | ICD-10-CM | POA: Diagnosis not present

## 2019-11-24 NOTE — Telephone Encounter (Signed)
WK-Pt having trouble swallowing Depakote/has an appt on 4.9.21/has been taking it for years and husband has no choice but to not give it to her/nurse is requesting an alternate form so pt may swallow and continue medication until seen/plz advise/thx dmf  (Call spouse)

## 2019-11-25 ENCOUNTER — Ambulatory Visit: Payer: Medicare Other | Attending: Internal Medicine

## 2019-11-25 DIAGNOSIS — Z23 Encounter for immunization: Secondary | ICD-10-CM

## 2019-11-25 NOTE — Telephone Encounter (Signed)
Pt spouse is aware, he going to speak to Dr. Loletha Grayer  During the appt 11/27/19.

## 2019-11-25 NOTE — Telephone Encounter (Signed)
Don't see an alternative form. Will need to crush and take with applesauce.

## 2019-11-25 NOTE — Progress Notes (Signed)
   Covid-19 Vaccination Clinic  Name:  Emily Castaneda    MRN: [id number] DOB: 1935/05/20  11/25/2019  [name] was observed post Covid-19 immunization for 15 minutes without incident. She was provided with Vaccine Information Sheet and instruction to access the V-Safe system.   [name] was instructed to call 911 with any severe reactions post vaccine: Marland Kitchen Difficulty breathing  . Swelling of face and throat  . A fast heartbeat  . A bad rash all over body  . Dizziness and weakness   Immunizations Administered    Name Date Dose VIS Date Route   Pfizer COVID-19 Vaccine 11/25/2019  9:51 AM 0.3 mL 07/31/2019 Intramuscular   Manufacturer: [employer], [employer]   Lot: [id number]   [place]: [id number]

## 2019-11-26 DIAGNOSIS — J690 Pneumonitis due to inhalation of food and vomit: Secondary | ICD-10-CM | POA: Diagnosis not present

## 2019-11-26 DIAGNOSIS — J9601 Acute respiratory failure with hypoxia: Secondary | ICD-10-CM | POA: Diagnosis not present

## 2019-11-26 DIAGNOSIS — M5134 Other intervertebral disc degeneration, thoracic region: Secondary | ICD-10-CM | POA: Diagnosis not present

## 2019-11-27 ENCOUNTER — Ambulatory Visit (INDEPENDENT_AMBULATORY_CARE_PROVIDER_SITE_OTHER): Payer: Medicare Other | Admitting: Family Medicine

## 2019-11-27 ENCOUNTER — Encounter: Payer: Self-pay | Admitting: Family Medicine

## 2019-11-27 ENCOUNTER — Other Ambulatory Visit: Payer: Self-pay

## 2019-11-27 VITALS — BP 108/70 | HR 96 | Temp 97.3°F | Ht 62.0 in | Wt 177.0 lb

## 2019-11-27 DIAGNOSIS — R630 Anorexia: Secondary | ICD-10-CM | POA: Diagnosis not present

## 2019-11-27 DIAGNOSIS — R600 Localized edema: Secondary | ICD-10-CM

## 2019-11-27 DIAGNOSIS — R531 Weakness: Secondary | ICD-10-CM

## 2019-11-27 DIAGNOSIS — R030 Elevated blood-pressure reading, without diagnosis of hypertension: Secondary | ICD-10-CM | POA: Diagnosis not present

## 2019-11-27 NOTE — Progress Notes (Addendum)
Emily Castaneda is a 85 y.o. female  Chief Complaint  Patient presents with  . Hypertension    Pt following up from elevated bp last week. Pt had 2nd covid vaccine this past Wednesday.    HPI: Emily Castaneda is a 85 y.o. female here with her husband. Appetite has improved. She is taking her pills with yogurt and this had made for easier swallowing. She is drinking plenty of fluids. Speech therapist coming to house and working with pt.  PT is coming and pt and husband state PT Clair Gulling is "excellent".  BP has improved and now controlled.  LE edema much improved after 5 days of lasix.   She had 2nd dose of covid vaccine. No issues.   Past Medical History:  Diagnosis Date  . Depression    Dr. Abner Greenspan  . Diverticulosis of colon   . Endometriosis   . Hx of colonic polyps   . Hyperlipidemia   . Hypothyroidism   . Manic, depressive ([place])   . Menopausal syndrome   . OSA (obstructive sleep apnea)    cpap; 17 cm (clint young)    Past Surgical History:  Procedure Laterality Date  . APPENDECTOMY    . COLONOSCOPY    . POLYPECTOMY     Dr. Ubaldo Glassing  . TONSILLECTOMY AND ADENOIDECTOMY      Social History   Socioeconomic History  . Marital status: Married    Spouse name: Not on file  . Number of children: Not on file  . Years of education: Not on file  . Highest education level: Not on file  Occupational History  . Occupation: retired  Tobacco Use  . Smoking status: Former Smoker    Packs/day: 0.10    Years: 10.00    Pack years: 1.00    Types: Cigarettes    Quit date: 08/21/1983    Years since quitting: 36.2  . Smokeless tobacco: Never Used  . Tobacco comment: only smoked socially  Substance and Sexual Activity  . Alcohol use: Yes    Alcohol/week: 1.0 - 2.0 standard drinks    Types: 1 - 2 Standard drinks or equivalent per week    Comment: occ glass of wine   . Drug use: No  . Sexual activity: Not on file  Other Topics Concern  . Not on file  Social History Narrative  .  Not on file   Social Determinants of Health   Financial Resource Strain: Low Risk   . Difficulty of Paying Living Expenses: Not hard at all  Food Insecurity: No Food Insecurity  . Worried About Charity fundraiser in the Last Year: Never true  . Ran Out of Food in the Last Year: Never true  Transportation Needs: No Transportation Needs  . Lack of Transportation (Medical): No  . Lack of Transportation (Non-Medical): No  Physical Activity:   . Days of Exercise per Week:   . Minutes of Exercise per Session:   Stress:   . Feeling of Stress :   Social Connections:   . Frequency of Communication with Friends and Family:   . Frequency of Social Gatherings with Friends and Family:   . Attends Religious Services:   . Active Member of Clubs or Organizations:   . Attends Archivist Meetings:   Marland Kitchen Marital Status:   Intimate Partner Violence:   . Fear of Current or Ex-Partner:   . Emotionally Abused:   Marland Kitchen Physically Abused:   . Sexually Abused:  Family History  Problem Relation Age of Onset  . Diabetes Mother   . Heart disease Father        complications of coronary heart disease  . Diabetes Brother   . Heart disease Other   . Cancer Other        BREAST AND LUNG   . Breast cancer Maternal Aunt   . Cancer Maternal Grandfather        LUNG  . Breast cancer Daughter   . Colon cancer Neg Hx   . Rectal cancer Neg Hx   . Stomach cancer Neg Hx      Immunization History  Administered Date(s) Administered  . Influenza Split 09/20/2013  . Influenza, High Dose Seasonal PF 08/07/2016, 09/18/2017, 06/13/2018  . PFIZER SARS-COV-2 Vaccination 10/25/2019, 11/25/2019  . Pneumococcal Conjugate-13 09/11/2013  . Pneumococcal Polysaccharide-23 02/25/2009  . Td 02/25/2009  . Zoster 12/10/2012    Outpatient Encounter Medications as of 11/27/2019  Medication Sig Note  . atorvastatin (LIPITOR) 10 MG tablet TAKE 1 TABLET BY MOUTH DAILY (Patient taking differently: Take 10 mg by mouth  daily at 6 PM. )   . Cholecalciferol (VITAMIN D3) 2000 units TABS Take 1 capsule by mouth daily.   . divalproex (DEPAKOTE ER) 250 MG 24 hr tablet Take 1,250 mg by mouth every evening.   . furosemide (LASIX) 20 MG tablet Take 1 tablet (20 mg total) by mouth daily as needed.   Marland Kitchen levothyroxine (SYNTHROID) 112 MCG tablet TAKE 1 TABLET(112 MCG) BY MOUTH DAILY (Patient taking differently: Take 112 mcg by mouth daily before breakfast. )   . Multiple Vitamins-Minerals (PRESERVISION AREDS 2) CAPS Take 1 capsule by mouth 2 (two) times daily.   . rivastigmine (EXELON) 13.3 MG/24HR Place 13.3 mg onto the skin daily.    . TURMERIC CURCUMIN PO Take by mouth.   . Vitamin D, Ergocalciferol, (DRISDOL) 50000 units CAPS capsule TAKE 1 CAPSULE BY MOUTH EVERY 7 DAYS (Patient taking differently: Take 50,000 Units by mouth every 7 (seven) days. ) 11/02/2019: Takes on mondays   No facility-administered encounter medications on file as of 11/27/2019.     ROS: Pertinent positives and negatives noted in HPI. Remainder of ROS non-contributory   No Known Allergies  BP 108/70 (BP Location: Left Arm, Patient Position: Sitting, Cuff Size: Normal)   Pulse 96   Temp (!) 97.3 F (36.3 C) (Temporal)   Ht 5\' 2"  (1.575 m)   Wt 177 lb (80.3 kg)   SpO2 97%   BMI 32.37 kg/m   BP Readings from Last 3 Encounters:  11/27/19 108/70  11/13/19 140/80  11/05/19 (!) 143/67     Physical Exam  Constitutional: She appears well-developed and well-nourished. No distress.  Cardiovascular: Normal rate, regular rhythm and intact distal pulses.  Pulmonary/Chest: Effort normal and breath sounds normal. No respiratory distress. She has no wheezes. She has no rhonchi.  Musculoskeletal:        General: No edema.  Neurological: She is alert.  Psychiatric: She has a normal mood and affect. Her behavior is normal.     A/P:  1. Edema leg - improved, back to baseline after lasix 20mg  daily x 5 days  2. Poor appetite - improved, pt  continues to work with home ST   3. Generalized weakness - improving, pt continues with home PT - able to ambulate with 4 point cane without issue  4. Elevated BP without diagnosis of hypertension - resolved, back to normal  F/u PRN or in 4 mo  I personally spent 30 min with patient in obtaining hpi, exam and discussion about diagnoses and plan.  This visit occurred during the SARS-CoV-2 public health emergency.  Safety protocols were in place, including screening questions prior to the visit, additional usage of staff PPE, and extensive cleaning of exam room while observing appropriate contact time as indicated for disinfecting solutions.

## 2019-12-01 DIAGNOSIS — J690 Pneumonitis due to inhalation of food and vomit: Secondary | ICD-10-CM | POA: Diagnosis not present

## 2019-12-01 DIAGNOSIS — M5134 Other intervertebral disc degeneration, thoracic region: Secondary | ICD-10-CM | POA: Diagnosis not present

## 2019-12-01 DIAGNOSIS — J9601 Acute respiratory failure with hypoxia: Secondary | ICD-10-CM | POA: Diagnosis not present

## 2019-12-02 DIAGNOSIS — J9601 Acute respiratory failure with hypoxia: Secondary | ICD-10-CM | POA: Diagnosis not present

## 2019-12-02 DIAGNOSIS — J690 Pneumonitis due to inhalation of food and vomit: Secondary | ICD-10-CM | POA: Diagnosis not present

## 2019-12-02 DIAGNOSIS — M5134 Other intervertebral disc degeneration, thoracic region: Secondary | ICD-10-CM | POA: Diagnosis not present

## 2019-12-03 DIAGNOSIS — J9601 Acute respiratory failure with hypoxia: Secondary | ICD-10-CM | POA: Diagnosis not present

## 2019-12-03 DIAGNOSIS — J690 Pneumonitis due to inhalation of food and vomit: Secondary | ICD-10-CM | POA: Diagnosis not present

## 2019-12-03 DIAGNOSIS — M5134 Other intervertebral disc degeneration, thoracic region: Secondary | ICD-10-CM | POA: Diagnosis not present

## 2019-12-07 DIAGNOSIS — J9601 Acute respiratory failure with hypoxia: Secondary | ICD-10-CM | POA: Diagnosis not present

## 2019-12-07 DIAGNOSIS — M5134 Other intervertebral disc degeneration, thoracic region: Secondary | ICD-10-CM | POA: Diagnosis not present

## 2019-12-07 DIAGNOSIS — J690 Pneumonitis due to inhalation of food and vomit: Secondary | ICD-10-CM | POA: Diagnosis not present

## 2019-12-10 ENCOUNTER — Telehealth: Payer: Self-pay | Admitting: Family Medicine

## 2019-12-10 DIAGNOSIS — M5134 Other intervertebral disc degeneration, thoracic region: Secondary | ICD-10-CM | POA: Diagnosis not present

## 2019-12-10 DIAGNOSIS — J9601 Acute respiratory failure with hypoxia: Secondary | ICD-10-CM | POA: Diagnosis not present

## 2019-12-10 DIAGNOSIS — J690 Pneumonitis due to inhalation of food and vomit: Secondary | ICD-10-CM | POA: Diagnosis not present

## 2019-12-10 NOTE — Telephone Encounter (Signed)
Unable to leave a message, tried calling pt twice but it rings once and cuts off.

## 2019-12-10 NOTE — Telephone Encounter (Signed)
For lower extremity/pedal edema, pt can take lasix 20mg  1 tab daily x 3 days then stop and reassess swelling.

## 2019-12-10 NOTE — Telephone Encounter (Signed)
Herbert Deaner PT with [facility] health calling, today is the last day of home health services and pt is doing well and feeling well, she does have minor swelling on left ankle, plus one on left and Trace on right, There is not a diagonosis for it and previously she had stopped taking lasix and followed the dr direction it has been a while since she took her last dose.  For any questions call [phone].

## 2019-12-11 NOTE — Telephone Encounter (Signed)
Still unable to call an inform patient, phone cuts off.

## 2019-12-16 NOTE — Telephone Encounter (Signed)
Unable to get through to pt on phone, tried several times.

## 2020-01-11 ENCOUNTER — Telehealth: Payer: Self-pay | Admitting: Family Medicine

## 2020-01-11 NOTE — Telephone Encounter (Signed)
Chong Sicilian is calling and wanted to see if an order was received by fax for patient to have labs done. Pls advise. CB is [phone]

## 2020-01-13 NOTE — Telephone Encounter (Signed)
I called [facility] and LDM for [facility], informing them that we received the lab documents.

## 2020-01-25 ENCOUNTER — Telehealth: Payer: Self-pay | Admitting: Family Medicine

## 2020-01-25 NOTE — Telephone Encounter (Signed)
Patient daughter is calling and stated that patient is still having trouble walking from her fall and wanted to see if Dr. Loletha Grayer could put in a order for patient to continue PT.  Informed patient that Dr. Loletha Grayer will not be in the office until Wednesday and she may not here anything back until then. CB [phone]

## 2020-01-25 NOTE — Telephone Encounter (Signed)
Please see message and advise.  Thank you. ° °

## 2020-01-26 ENCOUNTER — Telehealth (INDEPENDENT_AMBULATORY_CARE_PROVIDER_SITE_OTHER): Payer: Medicare Other | Admitting: Nurse Practitioner

## 2020-01-26 ENCOUNTER — Other Ambulatory Visit: Payer: Self-pay | Admitting: Nurse Practitioner

## 2020-01-26 ENCOUNTER — Encounter: Payer: Self-pay | Admitting: Nurse Practitioner

## 2020-01-26 VITALS — BP 126/71 | HR 91 | Ht 62.0 in

## 2020-01-26 DIAGNOSIS — R600 Localized edema: Secondary | ICD-10-CM

## 2020-01-26 MED ORDER — FUROSEMIDE 20 MG PO TABS: ORAL_TABLET | ORAL | 0 refills | Status: DC

## 2020-01-26 MED ORDER — POTASSIUM CHLORIDE CRYS ER 20 MEQ PO TBCR: EXTENDED_RELEASE_TABLET | ORAL | 0 refills | Status: DC

## 2020-01-26 NOTE — Progress Notes (Signed)
Virtual Visit via Video Note  I connected with@ on 01/26/20 at 11:30 AM EDT by a video enabled telemedicine application and verified that I am speaking with the correct person using two identifiers.  Location: Patient:Home Provider: Office Participants: patient, daughter and provider   I discussed the limitations of evaluation and management by telemedicine and the availability of in person appointments. I also discussed with the patient that there may be a patient responsible charge related to this service. The patient expressed understanding and agreed to proceed.  CC:Pt c/o having water pockets in rt leg, x3day.  Pt said both legs are swelling. Unable to provide weight.  History of Present Illness: Mr. Emily Castaneda and her daughter reports worsening bilateral LE edema. Denies any recent injury or fall. No improvement with elevation, unable to tolerate use of compression stocking. Denies any SOB or cough or PND or palpitation or CP. Daughter thinks this is as result decrease mobility since home PT was discontinued 5month ago. She is waiting for pcp to approve additional home PT sessions   Wt Readings from Last 3 Encounters:  11/27/19 177 lb (80.3 kg)  11/13/19 181 lb 12.8 oz (82.5 kg)  11/02/19 188 lb 11.4 oz (85.6 kg)   BP Readings from Last 3 Encounters:  01/26/20 126/71  11/27/19 108/70  11/13/19 140/80   Observations/Objective: Physical Exam  Constitutional: She is oriented to person, place, and time.  Cardiovascular: Normal rate.  Pulmonary/Chest: Effort normal.  Musculoskeletal:        General: Edema present.  Neurological: She is alert and oriented to person, place, and time.  Skin: No rash noted. No erythema.  Vitals reviewed.  Assessment and Plan: Emily Castaneda was seen today for leg swelling.  Diagnoses and all orders for this visit:  Bilateral leg edema -     furosemide (LASIX) 20 MG tablet; Take 40mg  daily x 3days, then 20mg  daily till complete -     potassium chloride  SA (KLOR-CON) 20 MEQ tablet; 1tab BID x 3days, then 1tab daily till complete   Follow Up Instructions: Take furosemide and potassium as prescribed. F/up with pcp in 2weeks   I discussed the assessment and treatment plan with the patient. The patient was provided an opportunity to ask questions and all were answered. The patient agreed with the plan and demonstrated an understanding of the instructions.   The patient was advised to call back or seek an in-person evaluation if the symptoms worsen or if the condition fails to improve as anticipated.  Wilfred Lacy, NP

## 2020-01-27 NOTE — Telephone Encounter (Signed)
Please see message . Thank you .

## 2020-01-27 NOTE — Telephone Encounter (Signed)
I called [facility] and spoke to [place].  Clarise Cruz informed me that pt was discharged in April, and she would need a new referral but there is a waiting list, as they are booked up.  Clarise Cruz explained that they would not be able to accept the referral due to them having 14 referrals currently pending as of today.

## 2020-01-27 NOTE — Telephone Encounter (Signed)
Typically, if PT feels pt needs continued therapy they will recommend that to me. I am fine to sign/give order for that.  Emily Castaneda, please call [facility] [phone] to see if they will take verbal order to extend home PT d/t family's concerns about pt still having difficult ambulating

## 2020-01-27 NOTE — Telephone Encounter (Signed)
I spoke with Emily Castaneda to inform her about the referral.

## 2020-01-28 DIAGNOSIS — Z79899 Other long term (current) drug therapy: Secondary | ICD-10-CM | POA: Diagnosis not present

## 2020-02-09 ENCOUNTER — Other Ambulatory Visit: Payer: Self-pay

## 2020-02-10 ENCOUNTER — Ambulatory Visit (INDEPENDENT_AMBULATORY_CARE_PROVIDER_SITE_OTHER): Payer: Medicare Other | Admitting: Family Medicine

## 2020-02-10 ENCOUNTER — Encounter: Payer: Self-pay | Admitting: Family Medicine

## 2020-02-10 VITALS — BP 120/78 | HR 90 | Temp 96.7°F | Ht 62.0 in | Wt 185.8 lb

## 2020-02-10 DIAGNOSIS — R600 Localized edema: Secondary | ICD-10-CM

## 2020-02-10 DIAGNOSIS — E039 Hypothyroidism, unspecified: Secondary | ICD-10-CM | POA: Diagnosis not present

## 2020-02-10 DIAGNOSIS — M353 Polymyalgia rheumatica: Secondary | ICD-10-CM

## 2020-02-10 DIAGNOSIS — I872 Venous insufficiency (chronic) (peripheral): Secondary | ICD-10-CM

## 2020-02-10 DIAGNOSIS — R262 Difficulty in walking, not elsewhere classified: Secondary | ICD-10-CM

## 2020-02-10 DIAGNOSIS — R195 Other fecal abnormalities: Secondary | ICD-10-CM

## 2020-02-10 DIAGNOSIS — R531 Weakness: Secondary | ICD-10-CM

## 2020-02-10 DIAGNOSIS — Z9181 History of falling: Secondary | ICD-10-CM

## 2020-02-10 DIAGNOSIS — F319 Bipolar disorder, unspecified: Secondary | ICD-10-CM

## 2020-02-10 MED ORDER — FUROSEMIDE 20 MG PO TABS: 20.0000 mg | ORAL_TABLET | Freq: Every day | ORAL | 1 refills | Status: DC

## 2020-02-10 MED ORDER — POTASSIUM CHLORIDE CRYS ER 20 MEQ PO TBCR: 20.0000 meq | EXTENDED_RELEASE_TABLET | Freq: Every day | ORAL | 1 refills | Status: DC

## 2020-02-10 NOTE — Patient Instructions (Addendum)
Metamucil or benefiber or other fiber supplement daily to help bulk stools If no improvement in about 1 week, you can try 1 or 2 at most tabs of imodium If still no improvement, add daily probiotic (Align or other brand with lactobacillus or acidophilus)

## 2020-02-10 NOTE — Progress Notes (Addendum)
Emily Castaneda is a 85 y.o. female  Chief Complaint  Patient presents with  . Follow-up    Pt here for a 2 week follow up visit.  Pt's legs have went down but she had recenty developed some blisters on rt leg.    HPI: Emily Castaneda is a 85 y.o. female here with her daughter for f/u from OV with [facility] 2 wks ago for B/L LE edema. At that time, pt noted elevation did not help significantly and she is unable to tolerate compression stockings. Symptoms worsened since home PT was stopped more than 1 mo ago. She was given Rx for lasix 40mg  x 3 days then 20mg  daily as well as Klor-con 61meq.  Today, daughter reports the swelling has improved significantly. She does note 2 "water blisters" that have developed on pts RLE. These "popped" and are improved.  No fever, chills.   Daughter requests referral for home PT d/t generalized weakness, needs more help getting in/out of chair and around house.   Pt complains of loose BM every other day or so. No blood in stool. No fever, chills. No n/v, abd pain. At times, pt does not make it to the bathroom. Symptoms x 2+ mo or more.   Past Medical History:  Diagnosis Date  . Depression    Dr. Abner Greenspan  . Diverticulosis of colon   . Endometriosis   . Hx of colonic polyps   . Hyperlipidemia   . Hypothyroidism   . Manic, depressive ([place])   . Menopausal syndrome   . OSA (obstructive sleep apnea)    cpap; 17 cm (clint young)    Past Surgical History:  Procedure Laterality Date  . APPENDECTOMY    . COLONOSCOPY    . POLYPECTOMY     Dr. Ubaldo Glassing  . TONSILLECTOMY AND ADENOIDECTOMY      Social History   Socioeconomic History  . Marital status: Married    Spouse name: Not on file  . Number of children: Not on file  . Years of education: Not on file  . Highest education level: Not on file  Occupational History  . Occupation: retired  Tobacco Use  . Smoking status: Former Smoker    Packs/day: 0.10    Years: 10.00    Pack years: 1.00     Types: Cigarettes    Quit date: 08/21/1983    Years since quitting: 36.4  . Smokeless tobacco: Never Used  . Tobacco comment: only smoked socially  Substance and Sexual Activity  . Alcohol use: Yes    Alcohol/week: 1.0 - 2.0 standard drink    Types: 1 - 2 Standard drinks or equivalent per week    Comment: occ glass of wine   . Drug use: No  . Sexual activity: Not on file  Other Topics Concern  . Not on file  Social History Narrative  . Not on file   Social Determinants of Health   Financial Resource Strain: Low Risk   . Difficulty of Paying Living Expenses: Not hard at all  Food Insecurity: No Food Insecurity  . Worried About Charity fundraiser in the Last Year: Never true  . Ran Out of Food in the Last Year: Never true  Transportation Needs: No Transportation Needs  . Lack of Transportation (Medical): No  . Lack of Transportation (Non-Medical): No  Physical Activity:   . Days of Exercise per Week:   . Minutes of Exercise per Session:   Stress:   .  Feeling of Stress :   Social Connections:   . Frequency of Communication with Friends and Family:   . Frequency of Social Gatherings with Friends and Family:   . Attends Religious Services:   . Active Member of Clubs or Organizations:   . Attends Archivist Meetings:   Marland Kitchen Marital Status:   Intimate Partner Violence:   . Fear of Current or Ex-Partner:   . Emotionally Abused:   Marland Kitchen Physically Abused:   . Sexually Abused:     Family History  Problem Relation Age of Onset  . Diabetes Mother   . Heart disease Father        complications of coronary heart disease  . Diabetes Brother   . Heart disease Other   . Cancer Other        BREAST AND LUNG   . Breast cancer Maternal Aunt   . Cancer Maternal Grandfather        LUNG  . Breast cancer Daughter   . Colon cancer Neg Hx   . Rectal cancer Neg Hx   . Stomach cancer Neg Hx      Immunization History  Administered Date(s) Administered  . Influenza Split  09/20/2013  . Influenza, High Dose Seasonal PF 08/07/2016, 09/18/2017, 06/13/2018  . PFIZER SARS-COV-2 Vaccination 10/25/2019, 11/25/2019  . Pneumococcal Conjugate-13 09/11/2013  . Pneumococcal Polysaccharide-23 02/25/2009  . Td 02/25/2009  . Zoster 12/10/2012    Outpatient Encounter Medications as of 02/10/2020  Medication Sig  . atorvastatin (LIPITOR) 10 MG tablet TAKE 1 TABLET BY MOUTH DAILY (Patient taking differently: Take 10 mg by mouth daily at 6 PM. )  . Cholecalciferol (VITAMIN D3) 2000 units TABS Take 1 capsule by mouth daily.  . Cyanocobalamin (B-12) 1000 MCG CAPS   . furosemide (LASIX) 20 MG tablet Take 40mg  daily x 3days, then 20mg  daily till complete  . levothyroxine (SYNTHROID) 112 MCG tablet TAKE 1 TABLET(112 MCG) BY MOUTH DAILY (Patient taking differently: Take 112 mcg by mouth daily before breakfast. )  . Multiple Vitamins-Minerals (PRESERVISION AREDS 2) CAPS Take 1 capsule by mouth 2 (two) times daily.  . potassium chloride SA (KLOR-CON) 20 MEQ tablet 1tab BID x 3days, then 1tab daily till complete  . divalproex (DEPAKOTE ER) 250 MG 24 hr tablet Take 1,250 mg by mouth every evening. (Patient not taking: Reported on 02/10/2020)   No facility-administered encounter medications on file as of 02/10/2020.     ROS: Pertinent positives and negatives noted in HPI. Remainder of ROS non-contributory    No Known Allergies  BP 120/78 (BP Location: Left Arm, Patient Position: Sitting, Cuff Size: Normal)   Pulse 90   Temp (!) 96.7 F (35.9 C) (Temporal)   Ht 5\' 2"  (1.575 m)   Wt 185 lb 12.8 oz (84.3 kg)   SpO2 97%   BMI 33.98 kg/m   BP Readings from Last 3 Encounters:  02/10/20 120/78  01/26/20 126/71  11/27/19 108/70   Pulse Readings from Last 3 Encounters:  02/10/20 90  01/26/20 91  11/27/19 96   Wt Readings from Last 3 Encounters:  02/10/20 185 lb 12.8 oz (84.3 kg)  11/27/19 177 lb (80.3 kg)  11/13/19 181 lb 12.8 oz (82.5 kg)    Physical  Exam Constitutional:      General: She is not in acute distress.    Appearance: She is not toxic-appearing.  Cardiovascular:     Rate and Rhythm: Normal rate and regular rhythm.     Pulses: Normal pulses.  Heart sounds: Normal heart sounds.  Pulmonary:     Effort: Pulmonary effort is normal. No respiratory distress.     Breath sounds: Normal breath sounds. No wheezing, rhonchi or rales.  Musculoskeletal:     Right lower leg: Edema present.     Left lower leg: Edema (trace B/L pedal edema, no open sores/blisters) present.  Neurological:     Mental Status: She is alert. Mental status is at baseline.     Coordination: Coordination abnormal (ambulates slowly and w/ cane).     Gait: Gait abnormal (very slow gait, ambulates with cane).  Psychiatric:        Behavior: Behavior normal.      A/P:  1. Bilateral leg edema - improved w/ lasix Refill: - furosemide (LASIX) 20 MG tablet; Take 1 tablet (20 mg total) by mouth daily.  Dispense: 90 tablet; Refill: 1 - potassium chloride SA (KLOR-CON) 20 MEQ tablet; Take 1 tablet (20 mEq total) by mouth daily.  Dispense: 90 tablet; Refill: 1 - f/u in 3 mo or sooner PRN  2. Bipolar 1 disorder, depressed ([place]) - cont on current med and regular f/u with Dr. Casimiro Needle  3. At risk for falls 4. Ambulatory dysfunction 5. Weakness generalized - Ambulatory referral to Home Health  6. Loose stools - recommend metamucil or other fiber supplement to bulk up stools - 1-2 tabs of imodium PRN - trial of probiotic daily x 3 mo - f/u if no/minimal improvement or if symptoms worsen  This visit occurred during the SARS-CoV-2 public health emergency.  Safety protocols were in place, including screening questions prior to the visit, additional usage of staff PPE, and extensive cleaning of exam room while observing appropriate contact time as indicated for disinfecting solutions.

## 2020-02-17 ENCOUNTER — Encounter: Payer: Self-pay | Admitting: Family Medicine

## 2020-02-17 DIAGNOSIS — R262 Difficulty in walking, not elsewhere classified: Secondary | ICD-10-CM

## 2020-02-17 DIAGNOSIS — Z9181 History of falling: Secondary | ICD-10-CM

## 2020-02-17 DIAGNOSIS — R531 Weakness: Secondary | ICD-10-CM

## 2020-02-17 NOTE — Telephone Encounter (Signed)
Please see message and advise.  Thank you. Pt would like to know where PT referral was sent.

## 2020-03-03 NOTE — Telephone Encounter (Signed)
Please see message and advise.  Thank you. ° °

## 2020-03-04 ENCOUNTER — Telehealth: Payer: Self-pay | Admitting: Family Medicine

## 2020-03-04 DIAGNOSIS — Z0279 Encounter for issue of other medical certificate: Secondary | ICD-10-CM

## 2020-03-04 NOTE — Telephone Encounter (Signed)
Patient daughter called and stated that she dropped off at [facility] form for Dr. Bryan Lemma to complete and fax back. Daughter wanted to check to see if Dr. Loletha Grayer had the chance to complete form and fax back to the facility today, please advise. CB is [phone].

## 2020-03-07 ENCOUNTER — Telehealth: Payer: Self-pay | Admitting: Family Medicine

## 2020-03-07 NOTE — Telephone Encounter (Signed)
Please let daughter know that I faxed the med list with document this morning.

## 2020-03-07 NOTE — Telephone Encounter (Signed)
Patient's daughter is requesting medication list for patient. Please fax to [facility] at [phone]. Any questions, please call daughter Judson Roch [phone].

## 2020-03-07 NOTE — Telephone Encounter (Signed)
Daughter called back regarding previous message left, please advise. Also daughter would like a call back when forms have been faxed and are ready for pick up, please advise. CB  [phone]

## 2020-03-07 NOTE — Telephone Encounter (Signed)
I spoke w/pt's daughter and informed her that sheet was faxed off and ready for pick up.

## 2020-03-21 DIAGNOSIS — R296 Repeated falls: Secondary | ICD-10-CM | POA: Diagnosis not present

## 2020-03-21 DIAGNOSIS — R600 Localized edema: Secondary | ICD-10-CM | POA: Diagnosis not present

## 2020-03-21 DIAGNOSIS — M25551 Pain in right hip: Secondary | ICD-10-CM | POA: Diagnosis not present

## 2020-03-21 DIAGNOSIS — R413 Other amnesia: Secondary | ICD-10-CM | POA: Diagnosis not present

## 2020-03-23 DIAGNOSIS — M6281 Muscle weakness (generalized): Secondary | ICD-10-CM | POA: Diagnosis not present

## 2020-03-23 DIAGNOSIS — R2689 Other abnormalities of gait and mobility: Secondary | ICD-10-CM | POA: Diagnosis not present

## 2020-03-23 DIAGNOSIS — R2681 Unsteadiness on feet: Secondary | ICD-10-CM | POA: Diagnosis not present

## 2020-03-23 DIAGNOSIS — M1711 Unilateral primary osteoarthritis, right knee: Secondary | ICD-10-CM | POA: Diagnosis not present

## 2020-03-23 DIAGNOSIS — Z9181 History of falling: Secondary | ICD-10-CM | POA: Diagnosis not present

## 2020-03-24 DIAGNOSIS — I10 Essential (primary) hypertension: Secondary | ICD-10-CM | POA: Diagnosis not present

## 2020-03-24 DIAGNOSIS — D649 Anemia, unspecified: Secondary | ICD-10-CM | POA: Diagnosis not present

## 2020-03-24 DIAGNOSIS — Z9181 History of falling: Secondary | ICD-10-CM | POA: Diagnosis not present

## 2020-03-24 DIAGNOSIS — M1711 Unilateral primary osteoarthritis, right knee: Secondary | ICD-10-CM | POA: Diagnosis not present

## 2020-03-24 DIAGNOSIS — R2681 Unsteadiness on feet: Secondary | ICD-10-CM | POA: Diagnosis not present

## 2020-03-24 DIAGNOSIS — Z79899 Other long term (current) drug therapy: Secondary | ICD-10-CM | POA: Diagnosis not present

## 2020-03-24 DIAGNOSIS — R2689 Other abnormalities of gait and mobility: Secondary | ICD-10-CM | POA: Diagnosis not present

## 2020-03-24 DIAGNOSIS — M6281 Muscle weakness (generalized): Secondary | ICD-10-CM | POA: Diagnosis not present

## 2020-03-25 DIAGNOSIS — M6281 Muscle weakness (generalized): Secondary | ICD-10-CM | POA: Diagnosis not present

## 2020-03-25 DIAGNOSIS — Z9181 History of falling: Secondary | ICD-10-CM | POA: Diagnosis not present

## 2020-03-25 DIAGNOSIS — M1711 Unilateral primary osteoarthritis, right knee: Secondary | ICD-10-CM | POA: Diagnosis not present

## 2020-03-25 DIAGNOSIS — R2681 Unsteadiness on feet: Secondary | ICD-10-CM | POA: Diagnosis not present

## 2020-03-25 DIAGNOSIS — R2689 Other abnormalities of gait and mobility: Secondary | ICD-10-CM | POA: Diagnosis not present

## 2020-03-29 DIAGNOSIS — Z9181 History of falling: Secondary | ICD-10-CM | POA: Diagnosis not present

## 2020-03-29 DIAGNOSIS — M1711 Unilateral primary osteoarthritis, right knee: Secondary | ICD-10-CM | POA: Diagnosis not present

## 2020-03-29 DIAGNOSIS — R2681 Unsteadiness on feet: Secondary | ICD-10-CM | POA: Diagnosis not present

## 2020-03-29 DIAGNOSIS — R2689 Other abnormalities of gait and mobility: Secondary | ICD-10-CM | POA: Diagnosis not present

## 2020-03-29 DIAGNOSIS — M6281 Muscle weakness (generalized): Secondary | ICD-10-CM | POA: Diagnosis not present

## 2020-03-31 DIAGNOSIS — M6281 Muscle weakness (generalized): Secondary | ICD-10-CM | POA: Diagnosis not present

## 2020-03-31 DIAGNOSIS — M1711 Unilateral primary osteoarthritis, right knee: Secondary | ICD-10-CM | POA: Diagnosis not present

## 2020-03-31 DIAGNOSIS — R2681 Unsteadiness on feet: Secondary | ICD-10-CM | POA: Diagnosis not present

## 2020-03-31 DIAGNOSIS — R2689 Other abnormalities of gait and mobility: Secondary | ICD-10-CM | POA: Diagnosis not present

## 2020-03-31 DIAGNOSIS — Z9181 History of falling: Secondary | ICD-10-CM | POA: Diagnosis not present

## 2020-04-01 DIAGNOSIS — Z9181 History of falling: Secondary | ICD-10-CM | POA: Diagnosis not present

## 2020-04-01 DIAGNOSIS — R2681 Unsteadiness on feet: Secondary | ICD-10-CM | POA: Diagnosis not present

## 2020-04-01 DIAGNOSIS — M6281 Muscle weakness (generalized): Secondary | ICD-10-CM | POA: Diagnosis not present

## 2020-04-01 DIAGNOSIS — M1711 Unilateral primary osteoarthritis, right knee: Secondary | ICD-10-CM | POA: Diagnosis not present

## 2020-04-01 DIAGNOSIS — R2689 Other abnormalities of gait and mobility: Secondary | ICD-10-CM | POA: Diagnosis not present

## 2020-04-04 DIAGNOSIS — Z9181 History of falling: Secondary | ICD-10-CM | POA: Diagnosis not present

## 2020-04-04 DIAGNOSIS — M6281 Muscle weakness (generalized): Secondary | ICD-10-CM | POA: Diagnosis not present

## 2020-04-04 DIAGNOSIS — R2681 Unsteadiness on feet: Secondary | ICD-10-CM | POA: Diagnosis not present

## 2020-04-04 DIAGNOSIS — R2689 Other abnormalities of gait and mobility: Secondary | ICD-10-CM | POA: Diagnosis not present

## 2020-04-04 DIAGNOSIS — M1711 Unilateral primary osteoarthritis, right knee: Secondary | ICD-10-CM | POA: Diagnosis not present

## 2020-04-05 DIAGNOSIS — M6281 Muscle weakness (generalized): Secondary | ICD-10-CM | POA: Diagnosis not present

## 2020-04-05 DIAGNOSIS — M1711 Unilateral primary osteoarthritis, right knee: Secondary | ICD-10-CM | POA: Diagnosis not present

## 2020-04-05 DIAGNOSIS — R2681 Unsteadiness on feet: Secondary | ICD-10-CM | POA: Diagnosis not present

## 2020-04-05 DIAGNOSIS — R2689 Other abnormalities of gait and mobility: Secondary | ICD-10-CM | POA: Diagnosis not present

## 2020-04-05 DIAGNOSIS — Z9181 History of falling: Secondary | ICD-10-CM | POA: Diagnosis not present

## 2020-04-06 DIAGNOSIS — Z9181 History of falling: Secondary | ICD-10-CM | POA: Diagnosis not present

## 2020-04-06 DIAGNOSIS — R2689 Other abnormalities of gait and mobility: Secondary | ICD-10-CM | POA: Diagnosis not present

## 2020-04-06 DIAGNOSIS — M1711 Unilateral primary osteoarthritis, right knee: Secondary | ICD-10-CM | POA: Diagnosis not present

## 2020-04-06 DIAGNOSIS — M6281 Muscle weakness (generalized): Secondary | ICD-10-CM | POA: Diagnosis not present

## 2020-04-06 DIAGNOSIS — R2681 Unsteadiness on feet: Secondary | ICD-10-CM | POA: Diagnosis not present

## 2020-04-07 DIAGNOSIS — M1711 Unilateral primary osteoarthritis, right knee: Secondary | ICD-10-CM | POA: Diagnosis not present

## 2020-04-07 DIAGNOSIS — R2689 Other abnormalities of gait and mobility: Secondary | ICD-10-CM | POA: Diagnosis not present

## 2020-04-07 DIAGNOSIS — Z9181 History of falling: Secondary | ICD-10-CM | POA: Diagnosis not present

## 2020-04-07 DIAGNOSIS — M6281 Muscle weakness (generalized): Secondary | ICD-10-CM | POA: Diagnosis not present

## 2020-04-07 DIAGNOSIS — R2681 Unsteadiness on feet: Secondary | ICD-10-CM | POA: Diagnosis not present

## 2020-04-08 DIAGNOSIS — R2681 Unsteadiness on feet: Secondary | ICD-10-CM | POA: Diagnosis not present

## 2020-04-08 DIAGNOSIS — R2689 Other abnormalities of gait and mobility: Secondary | ICD-10-CM | POA: Diagnosis not present

## 2020-04-08 DIAGNOSIS — M6281 Muscle weakness (generalized): Secondary | ICD-10-CM | POA: Diagnosis not present

## 2020-04-08 DIAGNOSIS — Z9181 History of falling: Secondary | ICD-10-CM | POA: Diagnosis not present

## 2020-04-08 DIAGNOSIS — M1711 Unilateral primary osteoarthritis, right knee: Secondary | ICD-10-CM | POA: Diagnosis not present

## 2020-04-11 DIAGNOSIS — Z9181 History of falling: Secondary | ICD-10-CM | POA: Diagnosis not present

## 2020-04-11 DIAGNOSIS — R2689 Other abnormalities of gait and mobility: Secondary | ICD-10-CM | POA: Diagnosis not present

## 2020-04-11 DIAGNOSIS — R2681 Unsteadiness on feet: Secondary | ICD-10-CM | POA: Diagnosis not present

## 2020-04-11 DIAGNOSIS — M1711 Unilateral primary osteoarthritis, right knee: Secondary | ICD-10-CM | POA: Diagnosis not present

## 2020-04-11 DIAGNOSIS — M6281 Muscle weakness (generalized): Secondary | ICD-10-CM | POA: Diagnosis not present

## 2020-04-12 DIAGNOSIS — R2689 Other abnormalities of gait and mobility: Secondary | ICD-10-CM | POA: Diagnosis not present

## 2020-04-12 DIAGNOSIS — Z9181 History of falling: Secondary | ICD-10-CM | POA: Diagnosis not present

## 2020-04-12 DIAGNOSIS — M1711 Unilateral primary osteoarthritis, right knee: Secondary | ICD-10-CM | POA: Diagnosis not present

## 2020-04-12 DIAGNOSIS — M6281 Muscle weakness (generalized): Secondary | ICD-10-CM | POA: Diagnosis not present

## 2020-04-12 DIAGNOSIS — R2681 Unsteadiness on feet: Secondary | ICD-10-CM | POA: Diagnosis not present

## 2020-04-13 DIAGNOSIS — E109 Type 1 diabetes mellitus without complications: Secondary | ICD-10-CM | POA: Diagnosis not present

## 2020-04-13 DIAGNOSIS — R2689 Other abnormalities of gait and mobility: Secondary | ICD-10-CM | POA: Diagnosis not present

## 2020-04-13 DIAGNOSIS — Z9181 History of falling: Secondary | ICD-10-CM | POA: Diagnosis not present

## 2020-04-13 DIAGNOSIS — R2681 Unsteadiness on feet: Secondary | ICD-10-CM | POA: Diagnosis not present

## 2020-04-13 DIAGNOSIS — M1711 Unilateral primary osteoarthritis, right knee: Secondary | ICD-10-CM | POA: Diagnosis not present

## 2020-04-13 DIAGNOSIS — M6281 Muscle weakness (generalized): Secondary | ICD-10-CM | POA: Diagnosis not present

## 2020-04-14 DIAGNOSIS — Z9181 History of falling: Secondary | ICD-10-CM | POA: Diagnosis not present

## 2020-04-14 DIAGNOSIS — R2681 Unsteadiness on feet: Secondary | ICD-10-CM | POA: Diagnosis not present

## 2020-04-14 DIAGNOSIS — M6281 Muscle weakness (generalized): Secondary | ICD-10-CM | POA: Diagnosis not present

## 2020-04-14 DIAGNOSIS — R2689 Other abnormalities of gait and mobility: Secondary | ICD-10-CM | POA: Diagnosis not present

## 2020-04-14 DIAGNOSIS — M1711 Unilateral primary osteoarthritis, right knee: Secondary | ICD-10-CM | POA: Diagnosis not present

## 2020-04-15 ENCOUNTER — Telehealth: Payer: Self-pay | Admitting: Family Medicine

## 2020-04-15 NOTE — Progress Notes (Signed)
°  Chronic Care Management   Outreach Note  04/15/2020 Name: KEISY STRICKLER MRN: [id number] DOB: 1935/07/12  Referred by: Ronnald Nian, DO Reason for referral : No chief complaint on file.   An unsuccessful telephone outreach was attempted today. The patient was referred to the pharmacist for assistance with care management and care coordination.   Follow Up Plan:   Carley Perdue [employer] Scheduler

## 2020-04-18 DIAGNOSIS — R2681 Unsteadiness on feet: Secondary | ICD-10-CM | POA: Diagnosis not present

## 2020-04-18 DIAGNOSIS — Z9181 History of falling: Secondary | ICD-10-CM | POA: Diagnosis not present

## 2020-04-18 DIAGNOSIS — M1711 Unilateral primary osteoarthritis, right knee: Secondary | ICD-10-CM | POA: Diagnosis not present

## 2020-04-18 DIAGNOSIS — M6281 Muscle weakness (generalized): Secondary | ICD-10-CM | POA: Diagnosis not present

## 2020-04-18 DIAGNOSIS — R2689 Other abnormalities of gait and mobility: Secondary | ICD-10-CM | POA: Diagnosis not present

## 2020-04-19 DIAGNOSIS — Z9181 History of falling: Secondary | ICD-10-CM | POA: Diagnosis not present

## 2020-04-19 DIAGNOSIS — M1711 Unilateral primary osteoarthritis, right knee: Secondary | ICD-10-CM | POA: Diagnosis not present

## 2020-04-19 DIAGNOSIS — R2681 Unsteadiness on feet: Secondary | ICD-10-CM | POA: Diagnosis not present

## 2020-04-19 DIAGNOSIS — R2689 Other abnormalities of gait and mobility: Secondary | ICD-10-CM | POA: Diagnosis not present

## 2020-04-19 DIAGNOSIS — M6281 Muscle weakness (generalized): Secondary | ICD-10-CM | POA: Diagnosis not present

## 2020-04-21 DIAGNOSIS — Z9181 History of falling: Secondary | ICD-10-CM | POA: Diagnosis not present

## 2020-04-21 DIAGNOSIS — M1711 Unilateral primary osteoarthritis, right knee: Secondary | ICD-10-CM | POA: Diagnosis not present

## 2020-04-22 ENCOUNTER — Other Ambulatory Visit: Payer: Self-pay | Admitting: Family Medicine

## 2020-04-29 ENCOUNTER — Other Ambulatory Visit: Payer: Self-pay

## 2020-06-03 IMAGING — CT CT ANGIO CHEST
2 of 7 series · 18 of 46 positions shown · IV contrast (OMNIPAQUE)
Comparison: None

CLINICAL DATA: Shortness of breath since yesterday morning,
progressively worsened, productive cough, denies chest pain; history
of dementia, hypothyroidism, former smoker

EXAM:
CT ANGIOGRAPHY CHEST WITH CONTRAST
TECHNIQUE: Multidetector CT imaging of the chest was performed using the
standard protocol during bolus administration of intravenous
contrast. Multiplanar CT image reconstructions and MIPs were
obtained to evaluate the vascular anatomy.
CONTRAST:  100mL OMNIPAQUE IOHEXOL 350 MG/ML SOLN IV

[Series 6: thins · axial · 0.68mm/px · z∈[-118,+133]mm · 16 of 285 slices shown]
[im 17/285  lung]
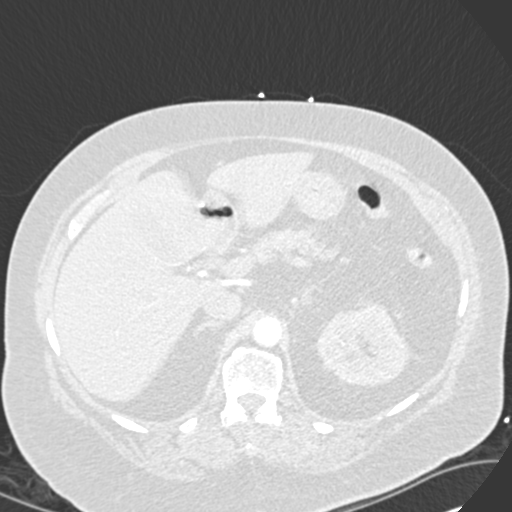
[im 34/285  soft-tissue]
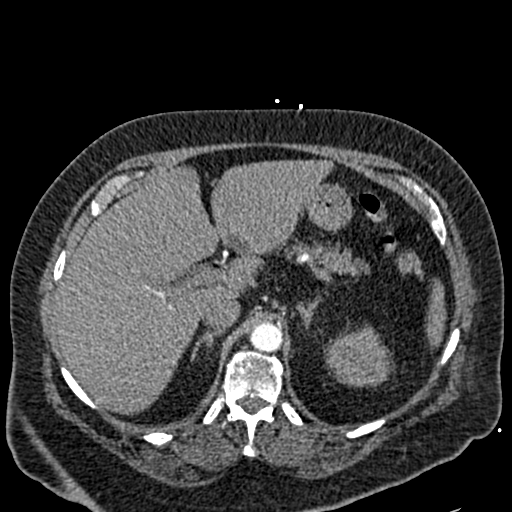
[im 51/285  lung]
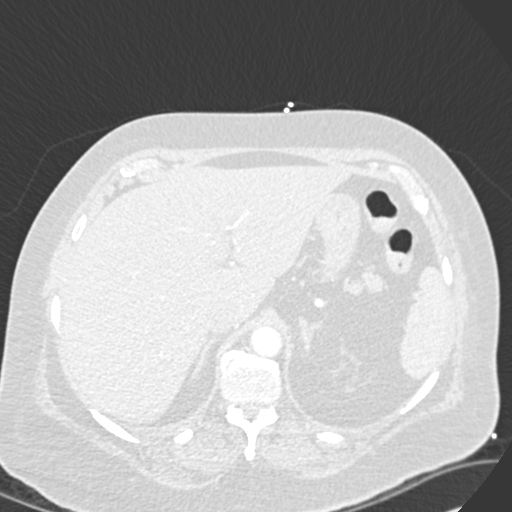
[im 67/285  soft-tissue]
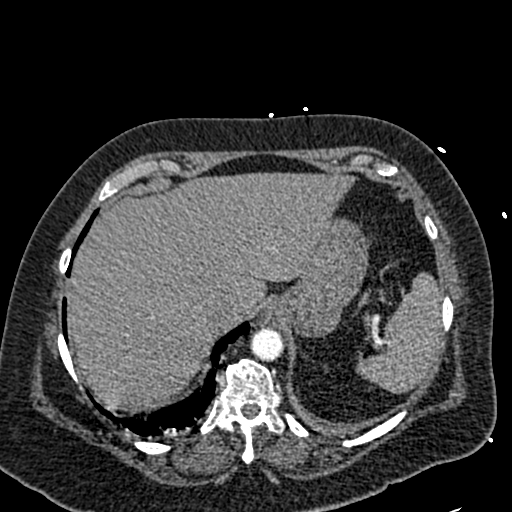
[im 84/285  lung]
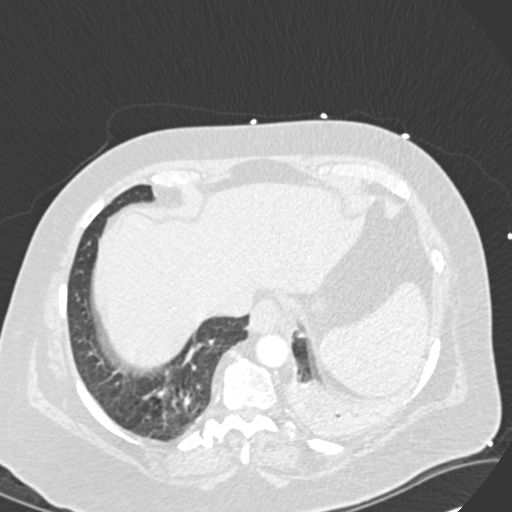
[im 101/285  soft-tissue]
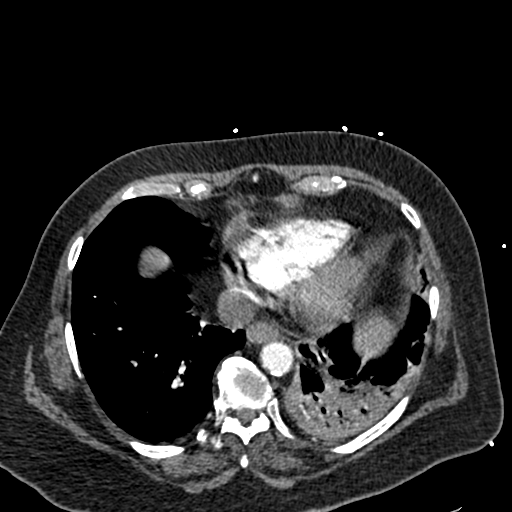
[im 117/285  lung]
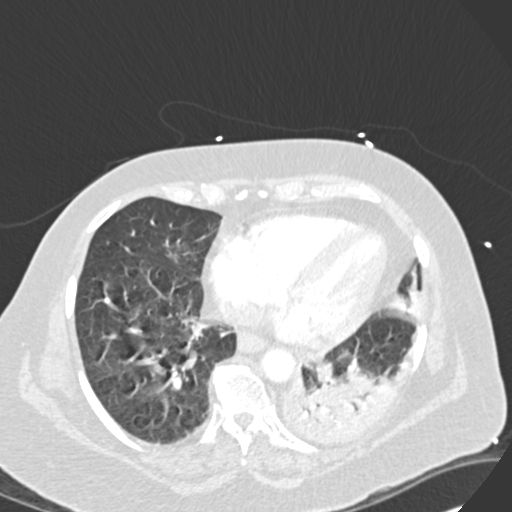
[im 134/285  soft-tissue]
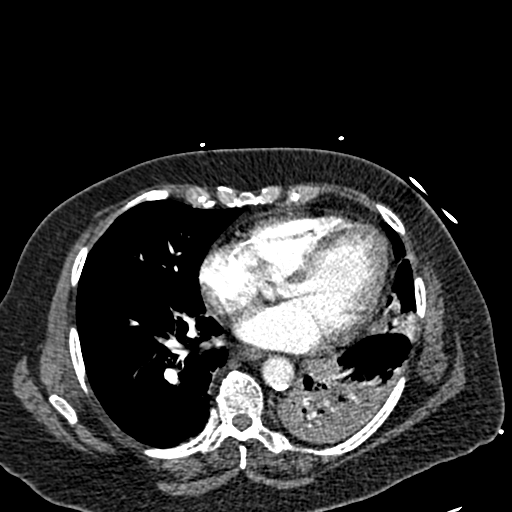
[im 151/285  lung]
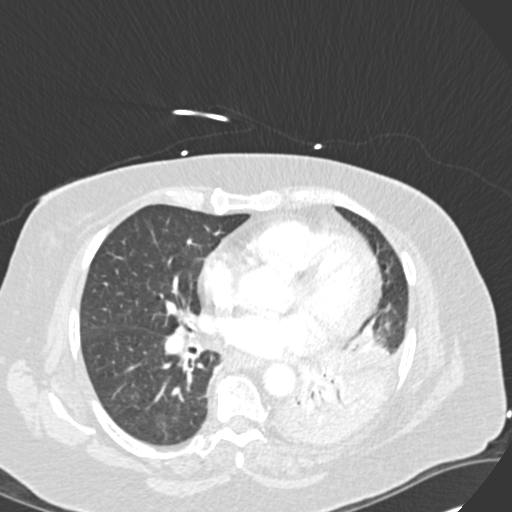
[im 168/285  soft-tissue]
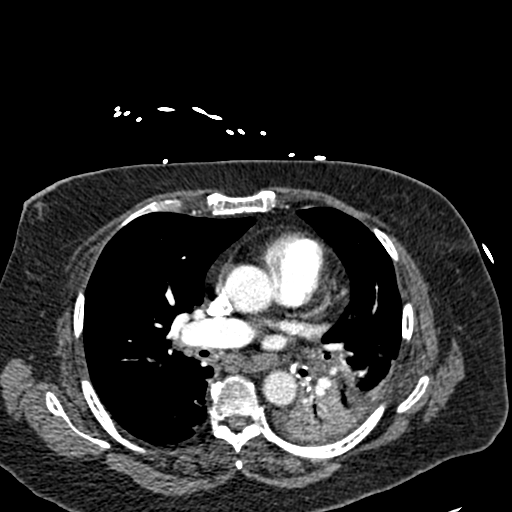
[im 184/285  lung]
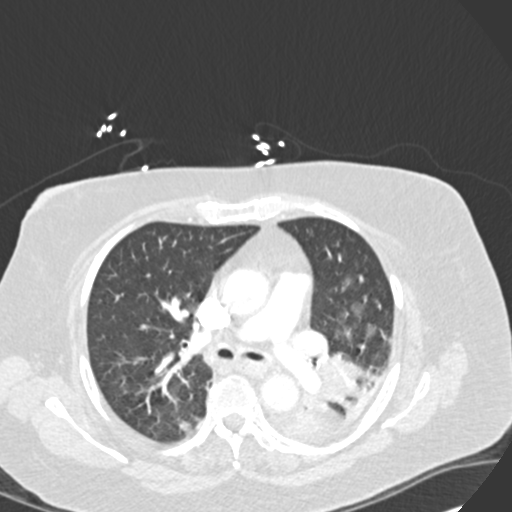
[im 201/285  soft-tissue]
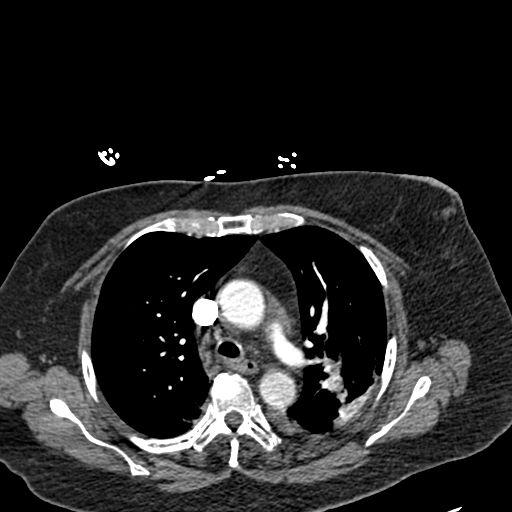
[im 218/285  lung]
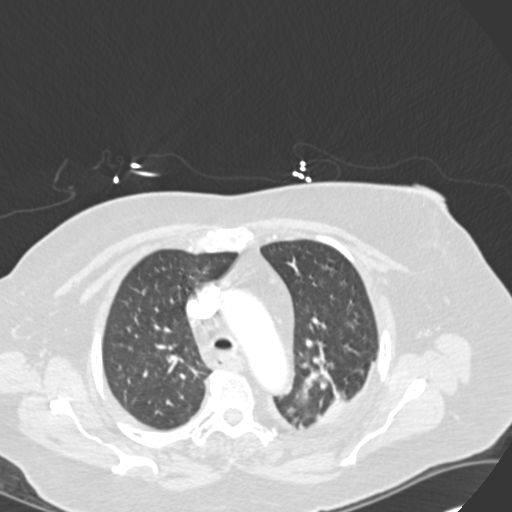
[im 234/285  soft-tissue]
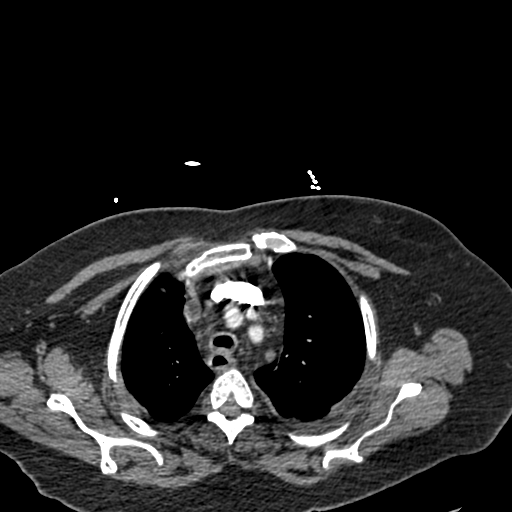
[im 251/285  lung]
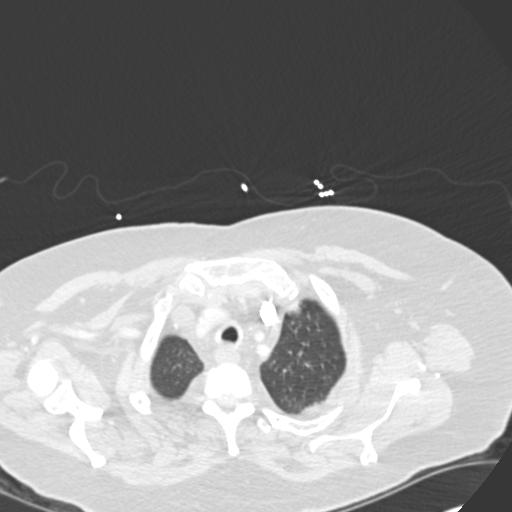
[im 268/285  soft-tissue]
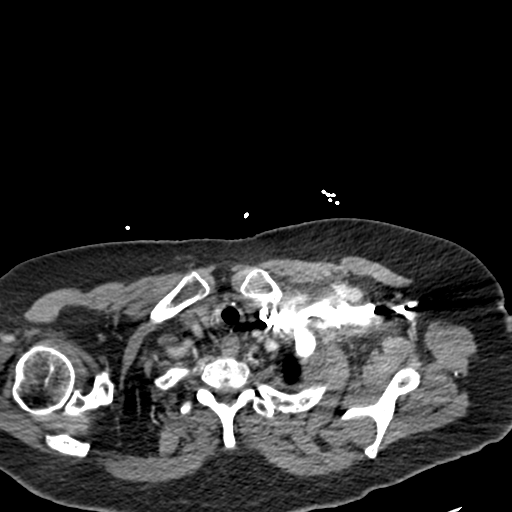

[Series 7: coronal mpr · coronal · 0.66mm/px · 2 of 81 slices shown]
[im 27/81  soft-tissue]
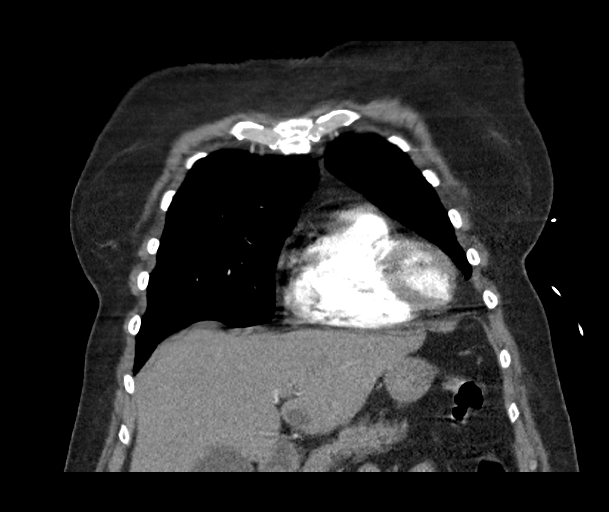
[im 54/81  soft-tissue]
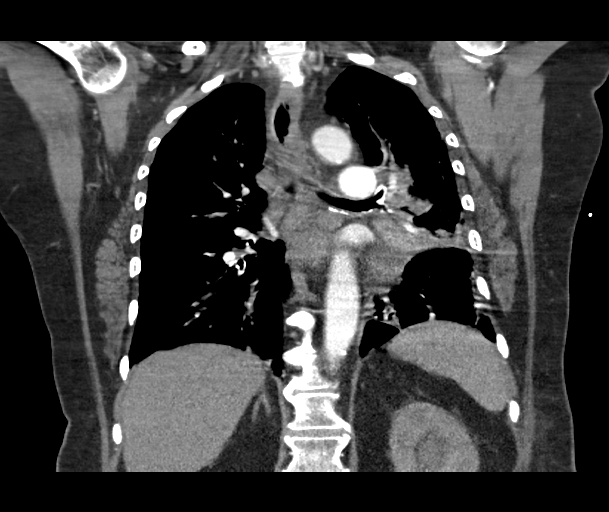

[18 of 46 positions shown; findings below may reference images not displayed]

FINDINGS: Cardiovascular: Mild atherosclerotic calcifications aorta, proximal
great vessels, minimally coronary arteries. Aorta normal caliber
without aneurysm or dissection. Heart unremarkable without
pericardial effusion. Pulmonary arteries adequately opacified

Mediastinum/Nodes: Base of cervical region normal appearance. Few
normal size mediastinal lymph nodes without thoracic adenopathy.
Esophagus unremarkable.

Lungs/Pleura: Focus of ground-glass opacity in RIGHT upper lobe
image 50, 9 x 6 mm. Subsegmental atelectasis RIGHT lower lobe.
Consolidation of LEFT lower lobe. Additional patchy scattered
infiltrates in LEFT upper lobe. Findings favor multifocal pneumonia.
No pleural effusion or pneumothorax.

Upper Abdomen: Small cyst LEFT lobe liver 17 x 10 mm image 84.
Remaining visualized upper abdomen unremarkable.

Musculoskeletal: Minimal scattered degenerative disc disease changes
thoracic spine. No acute osseous findings.

Review of the MIP images confirms the above findings.
IMPRESSION: No evidence of pulmonary embolism.

Consolidation of LEFT lower lobe with additional patchy infiltrates
in LEFT upper lobe favoring multifocal pneumonia.

Additional focus of questionable infiltrate versus ground-glass
opacity RIGHT upper lobe 9 x 6 mm; short-term follow-up CT
recommended in 3 months to assess for persistence.

Aortic Atherosclerosis ([id number]).

## 2020-06-06 DIAGNOSIS — Z7409 Other reduced mobility: Secondary | ICD-10-CM | POA: Diagnosis not present

## 2020-06-06 DIAGNOSIS — M545 Low back pain, unspecified: Secondary | ICD-10-CM | POA: Diagnosis not present

## 2020-06-24 ENCOUNTER — Encounter: Payer: Medicare Other | Admitting: Family Medicine

## 2020-06-28 ENCOUNTER — Encounter: Payer: Self-pay | Admitting: Pulmonary Disease

## 2020-06-28 ENCOUNTER — Other Ambulatory Visit: Payer: Self-pay

## 2020-06-28 ENCOUNTER — Ambulatory Visit: Payer: Medicare Other | Admitting: Pulmonary Disease

## 2020-06-28 VITALS — BP 118/72 | HR 90 | Temp 98.4°F | Ht 62.0 in | Wt 199.6 lb

## 2020-06-28 DIAGNOSIS — Z9989 Dependence on other enabling machines and devices: Secondary | ICD-10-CM

## 2020-06-28 DIAGNOSIS — G4733 Obstructive sleep apnea (adult) (pediatric): Secondary | ICD-10-CM

## 2020-06-28 DIAGNOSIS — R600 Localized edema: Secondary | ICD-10-CM

## 2020-06-28 NOTE — Progress Notes (Signed)
Subjective:    Patient ID: Emily Castaneda, female    DOB: [birth year], 85 y.o.   MRN: [id number]  HPI  85 yo woman With severe OSA & Bipolar dz presents to reestablish care.  She was last seen 05/2017  She was on CPAP 16 cm with nasal pillows .  She tried several interfaces before settling on nasal pillows.  CPAP helped improve her daytime somnolence and fatigue.  She also has bipolar disorder. Since her last visit, she has transition into independent living.  She and her husband both had falls, she now uses a walker.  She is accompanied by her daughter Hazle Coca today. She reports that her current machine is more than 83 years old and it is malfunctioning it seems to cut off during the night for the past week or 2 and she has been unable to use this.  Mild snoring has been noted by family members and she is extremely sleepy during the daytime. Epworth sleepiness score is 19. Bedtime is around 11 PM, sleep latency is variable between 30 minutes to an hour, reports 1-2 nocturnal awakenings and is out of bed sometimes at 4 AM.  She now is back to sleep in the recliner before she finally wakes up around 7 AM. Weight may have increased about 20 pounds  Bipolar symptoms appear to be well controlled.  Hospital discharge summary 10/2019 was reviewed, admitted for acute respiratory failure due to aspiration pneumonia, dysphagia improved by discharge, was evaluated by speech therapy, did not require oxygen on discharge   Significant tests/ events reviewed  PSG 03/2004 showed severe OSA with RDI 75/h corrected by CPAP 17 cm, she used other interfaces before settling on nasal pillows  Past Medical History:  Diagnosis Date  . Depression    Dr. Abner Greenspan  . Diverticulosis of colon   . Endometriosis   . Hx of colonic polyps   . Hyperlipidemia   . Hypothyroidism   . Manic, depressive ([place])   . Menopausal syndrome   . OSA (obstructive sleep apnea)    cpap; 17 cm (clint young)   Past Surgical  History:  Procedure Laterality Date  . APPENDECTOMY    . COLONOSCOPY    . POLYPECTOMY     Dr. Ubaldo Glassing  . TONSILLECTOMY AND ADENOIDECTOMY      No Known Allergies  Social History   Socioeconomic History  . Marital status: Married    Spouse name: Not on file  . Number of children: Not on file  . Years of education: Not on file  . Highest education level: Not on file  Occupational History  . Occupation: retired  Tobacco Use  . Smoking status: Former Smoker    Packs/day: 0.10    Years: 10.00    Pack years: 1.00    Types: Cigarettes    Quit date: 08/21/1983    Years since quitting: 36.8  . Smokeless tobacco: Never Used  . Tobacco comment: only smoked socially  Substance and Sexual Activity  . Alcohol use: Yes    Alcohol/week: 1.0 - 2.0 standard drink    Types: 1 - 2 Standard drinks or equivalent per week    Comment: occ glass of wine   . Drug use: No  . Sexual activity: Not on file  Other Topics Concern  . Not on file  Social History Narrative  . Not on file   Social Determinants of Health   Financial Resource Strain: Low Risk   . Difficulty of Paying Living Expenses:  Not hard at all  Food Insecurity: No Food Insecurity  . Worried About Charity fundraiser in the Last Year: Never true  . Ran Out of Food in the Last Year: Never true  Transportation Needs: No Transportation Needs  . Lack of Transportation (Medical): No  . Lack of Transportation (Non-Medical): No  Physical Activity:   . Days of Exercise per Week: Not on file  . Minutes of Exercise per Session: Not on file  Stress:   . Feeling of Stress : Not on file  Social Connections:   . Frequency of Communication with Friends and Family: Not on file  . Frequency of Social Gatherings with Friends and Family: Not on file  . Attends Religious Services: Not on file  . Active Member of Clubs or Organizations: Not on file  . Attends Archivist Meetings: Not on file  . Marital Status: Not on file  Intimate  Partner Violence:   . Fear of Current or Ex-Partner: Not on file  . Emotionally Abused: Not on file  . Physically Abused: Not on file  . Sexually Abused: Not on file     Family History  Problem Relation Age of Onset  . Diabetes Mother   . Heart disease Father        complications of coronary heart disease  . Diabetes Brother   . Heart disease Other   . Cancer Other        BREAST AND LUNG   . Breast cancer Maternal Aunt   . Cancer Maternal Grandfather        LUNG  . Breast cancer Daughter   . Colon cancer Neg Hx   . Rectal cancer Neg Hx   . Stomach cancer Neg Hx       Review of Systems  Shortness of breath with activity Weight gain Difficulty swallowing with pills Nasal congestion Depression feet swelling ankles Knee stiffness Rash on legs due to swelling    Objective:   Physical Exam  Gen. Pleasant, obese, in no distress, normal affect ENT - no pallor,icterus, no post nasal drip, class 2-3 airway Neck: No JVD, no thyromegaly, no carotid bruits Lungs: no use of accessory muscles, no dullness to percussion, decreased without rales or rhonchi  Cardiovascular: Rhythm regular, heart sounds  normal, no murmurs or gallops, no peripheral edema Abdomen: soft and non-tender, no hepatosplenomegaly, BS normal. Musculoskeletal: No deformities, no cyanosis or clubbing Neuro:  alert, non focal, no tremors       Assessment & Plan:

## 2020-06-28 NOTE — Patient Instructions (Signed)
Prescription for auto CPAP 10 to 16 cm will be sent to DME. We will also provide you new supplies including nasal pillows and tubing

## 2020-06-28 NOTE — Assessment & Plan Note (Signed)
Her machine is malfunctioning.  She would be eligible for a new machine.  She has previously been very compliant with her CPAP and this is certainly helped improve her daytime somnolence and fatigue. CPAP download from 2018 shows excellent compliance on 16 cm with no residual events and large leak.  She tried various interfaces before settling on nasal pillows. We will provide her with a new equipment.  We will also write her for a new auto CPAP machine 10 to 16 cm. We will bring her back in for in 3 months and review download and tweak settings if required.  For now we will keep her on nasal pillows but she seems to be open to trying different mask interfaces again

## 2020-06-28 NOTE — Assessment & Plan Note (Signed)
On compression stockings and daily Lasix

## 2020-07-01 DIAGNOSIS — G4730 Sleep apnea, unspecified: Secondary | ICD-10-CM | POA: Diagnosis not present

## 2020-07-01 DIAGNOSIS — G4733 Obstructive sleep apnea (adult) (pediatric): Secondary | ICD-10-CM | POA: Diagnosis not present

## 2020-08-24 ENCOUNTER — Other Ambulatory Visit: Payer: Self-pay

## 2020-08-25 ENCOUNTER — Ambulatory Visit (INDEPENDENT_AMBULATORY_CARE_PROVIDER_SITE_OTHER): Payer: Medicare Other | Admitting: Family Medicine

## 2020-08-25 ENCOUNTER — Encounter: Payer: Self-pay | Admitting: Family Medicine

## 2020-08-25 ENCOUNTER — Ambulatory Visit: Payer: Medicare Other | Admitting: Family Medicine

## 2020-08-25 VITALS — BP 132/78 | HR 96 | Temp 98.0°F | Ht 62.0 in | Wt 200.6 lb

## 2020-08-25 DIAGNOSIS — N3281 Overactive bladder: Secondary | ICD-10-CM | POA: Diagnosis not present

## 2020-08-25 DIAGNOSIS — R350 Frequency of micturition: Secondary | ICD-10-CM

## 2020-08-25 DIAGNOSIS — R159 Full incontinence of feces: Secondary | ICD-10-CM

## 2020-08-25 DIAGNOSIS — R197 Diarrhea, unspecified: Secondary | ICD-10-CM

## 2020-08-25 DIAGNOSIS — R32 Unspecified urinary incontinence: Secondary | ICD-10-CM

## 2020-08-25 DIAGNOSIS — R152 Fecal urgency: Secondary | ICD-10-CM

## 2020-08-25 LAB — POCT URINALYSIS DIPSTICK
Bilirubin, UA: NEGATIVE
Blood, UA: NEGATIVE
Glucose, UA: NEGATIVE
Leukocytes, UA: NEGATIVE
Nitrite, UA: NEGATIVE
Protein, UA: POSITIVE — AB
Spec Grav, UA: >=1.03 — AB (ref 1.010–1.025)
Urobilinogen, UA: 0.2 E.U./dL
pH, UA: 6 (ref 5.0–8.0)

## 2020-08-25 MED ORDER — MIRABEGRON ER 25 MG PO TB24: 25.0000 mg | ORAL_TABLET | Freq: Every day | ORAL | 1 refills | Status: DC

## 2020-08-25 MED ORDER — ALIGN 4 MG PO CAPS: 1.0000 | ORAL_CAPSULE | Freq: Every day | ORAL | 3 refills | Status: DC

## 2020-08-25 NOTE — Progress Notes (Signed)
Emily Castaneda is a 86 y.o. female  Chief Complaint  Patient presents with  . Acute Visit    Frequent urination x months.   Also having some bowel incontinence occasionally, thinks it might be the food she is eating at [employer].     HPI: Emily Castaneda is a 86 y.o. female accompanied by her husband. They are living at [facility]. Pt complains of frequent urination x months. No fever, chills, dysuria, gross hematuria.  Pt has dementia but no change in baseline mental status.   Pt also has occasional episodes of diarrhea and bowel incontinence. She has the urge that she is going to have BM but then "it just comes out". Stools are not formed. No abdominal pain or cramping. No blood that pt is aware of. RN has been giving PRN imodium. Appetite is normal. No n/v. She states she has tried to avoid certain foods but it hasn't made a difference. Husband states this had been happening prior to moving to [place]. No recent antibiotic use.   Past Medical History:  Diagnosis Date  . Depression    Dr. Raquel James  . Diverticulosis of colon   . Endometriosis   . Hx of colonic polyps   . Hyperlipidemia   . Hypothyroidism   . Manic, depressive (HCC)   . Menopausal syndrome   . OSA (obstructive sleep apnea)    cpap; 17 cm (clint young)    Past Surgical History:  Procedure Laterality Date  . APPENDECTOMY    . COLONOSCOPY    . POLYPECTOMY     Dr. Nicholas Lose  . TONSILLECTOMY AND ADENOIDECTOMY      Social History   Socioeconomic History  . Marital status: Married    Spouse name: Not on file  . Number of children: Not on file  . Years of education: Not on file  . Highest education level: Not on file  Occupational History  . Occupation: retired  Tobacco Use  . Smoking status: Former Smoker    Packs/day: 0.10    Years: 10.00    Pack years: 1.00    Types: Cigarettes    Quit date: 08/21/1983    Years since quitting: 37.0  . Smokeless tobacco: Never Used  . Tobacco comment: only smoked  socially  Substance and Sexual Activity  . Alcohol use: Yes    Alcohol/week: 1.0 - 2.0 standard drink    Types: 1 - 2 Standard drinks or equivalent per week    Comment: occ glass of wine   . Drug use: No  . Sexual activity: Not on file  Other Topics Concern  . Not on file  Social History Narrative  . Not on file   Social Determinants of Health   Financial Resource Strain: Low Risk   . Difficulty of Paying Living Expenses: Not hard at all  Food Insecurity: No Food Insecurity  . Worried About Programme researcher, broadcasting/film/video in the Last Year: Never true  . Ran Out of Food in the Last Year: Never true  Transportation Needs: No Transportation Needs  . Lack of Transportation (Medical): No  . Lack of Transportation (Non-Medical): No  Physical Activity: Not on file  Stress: Not on file  Social Connections: Not on file  Intimate Partner Violence: Not on file    Family History  Problem Relation Age of Onset  . Diabetes Mother   . Heart disease Father        complications of coronary heart disease  . Diabetes Brother   .  Heart disease Other   . Cancer Other        BREAST AND LUNG   . Breast cancer Maternal Aunt   . Cancer Maternal Grandfather        LUNG  . Breast cancer Daughter   . Colon cancer Neg Hx   . Rectal cancer Neg Hx   . Stomach cancer Neg Hx      Immunization History  Administered Date(s) Administered  . Influenza Split 09/20/2013  . Influenza, High Dose Seasonal PF 08/07/2016, 09/18/2017, 06/13/2018, 05/13/2019  . PFIZER SARS-COV-2 Vaccination 10/25/2019, 11/25/2019  . Pneumococcal Conjugate-13 09/11/2013  . Pneumococcal Polysaccharide-23 02/25/2009  . Td 02/25/2009  . Zoster 12/10/2012    Outpatient Encounter Medications as of 08/25/2020  Medication Sig  . Cholecalciferol (VITAMIN D3) 2000 units TABS Take 1 capsule by mouth daily.  . Cyanocobalamin (B-12) 1000 MCG CAPS   . divalproex (DEPAKOTE ER) 250 MG 24 hr tablet Take 1,250 mg by mouth every evening.   .  furosemide (LASIX) 20 MG tablet Take 1 tablet (20 mg total) by mouth daily.  Marland Kitchen levothyroxine (SYNTHROID) 112 MCG tablet Take 1 tablet (112 mcg total) by mouth daily before breakfast.  . Multiple Vitamins-Minerals (PRESERVISION AREDS 2) CAPS Take 1 capsule by mouth 2 (two) times daily.  . potassium chloride SA (KLOR-CON) 20 MEQ tablet Take 1 tablet (20 mEq total) by mouth daily.  Marland Kitchen atorvastatin (LIPITOR) 10 MG tablet Take 1 tablet (10 mg total) by mouth daily.   No facility-administered encounter medications on file as of 08/25/2020.     ROS: Pertinent positives and negatives noted in HPI. Remainder of ROS non-contributory  No Known Allergies  BP 132/78   Pulse 96   Temp 98 F (36.7 C) (Temporal)   Ht 5\' 2"  (1.575 m)   Wt 200 lb 9.6 oz (91 kg)   SpO2 98%   BMI 36.69 kg/m   Physical Exam Constitutional:      Appearance: Normal appearance.  Pulmonary:     Effort: No respiratory distress.  Abdominal:     General: Bowel sounds are normal. There is no distension.     Palpations: Abdomen is soft.     Tenderness: There is no abdominal tenderness.  Neurological:     Mental Status: She is alert. Mental status is at baseline.  Psychiatric:        Mood and Affect: Mood normal.        Behavior: Behavior normal.       Component     Latest Ref Rng & Units 08/25/2020          Color, UA      yellow  Clarity, UA      cloudy  Glucose     Negative Negative  Bilirubin, UA      negative  Ketones, UA      1+  Specific Gravity, UA     1.010 - 1.025 >=1.030 (A)  RBC, UA      negative  pH, UA     5.0 - 8.0 6.0  Protein,UA     Negative Positive (A)  Urobilinogen, UA     0.2 or 1.0 E.U./dL 0.2  Nitrite, UA      negative  Leukocytes,UA     Negative Negative  Appearance     CLEAR       A/P:  1. Urinary frequency 2. Overactive bladder 3. Urinary incontinence, unspecified type - POCT Urinalysis Dipstick - no indication of UTI - Urine Culture  Rx: - mirabegron ER  (MYRBETRIQ) 25 MG TB24 tablet; Take 1 tablet (25 mg total) by mouth daily.  Dispense: 90 tablet; Refill: 1 - f/u in 4-6 wks or sooner PRN  4. Diarrhea, unspecified type 5. Incontinence of feces with fecal urgency - ongoing for more than 1 year - Clostridium Difficile by PCR([facility]) - Gastrointestinal Pathogen Panel PCR Rx: - Probiotic Product (ALIGN) 4 MG CAPS; Take 1 capsule (4 mg total) by mouth daily.  Dispense: 90 capsule; Refill: 3 - if stool studies negative and no improvement with probiotic, refer to GI   This visit occurred during the SARS-CoV-2 public health emergency.  Safety protocols were in place, including screening questions prior to the visit, additional usage of staff PPE, and extensive cleaning of exam room while observing appropriate contact time as indicated for disinfecting solutions.

## 2020-08-25 NOTE — Patient Instructions (Addendum)
Trial of probiotic Align 4mg  1 cap daily  mirabegron ER (MYRBETRIQ) 25 MG TB24 tablet 1 tab po daily

## 2020-08-27 LAB — URINE CULTURE
MICRO NUMBER:: 11390102
Result:: NO GROWTH
SPECIMEN QUALITY:: ADEQUATE

## 2020-08-29 ENCOUNTER — Other Ambulatory Visit (INDEPENDENT_AMBULATORY_CARE_PROVIDER_SITE_OTHER): Payer: Medicare Other

## 2020-08-29 DIAGNOSIS — R197 Diarrhea, unspecified: Secondary | ICD-10-CM

## 2020-08-29 DIAGNOSIS — R159 Full incontinence of feces: Secondary | ICD-10-CM

## 2020-08-29 DIAGNOSIS — R152 Fecal urgency: Secondary | ICD-10-CM

## 2020-08-29 NOTE — Addendum Note (Signed)
Addended by: Marrion Coy on: 08/29/2020 03:50 PM   Modules accepted: Orders

## 2020-08-29 NOTE — Addendum Note (Signed)
Addended by: Yordan Martindale. M on: 08/29/2020 04:09 PM   Modules accepted: Orders  

## 2020-08-29 NOTE — Addendum Note (Signed)
Addended by: Marrion Coy on: 08/29/2020 04:09 PM   Modules accepted: Orders

## 2020-08-31 LAB — C. DIFFICILE GDH AND TOXIN A/B
GDH ANTIGEN: NOT DETECTED
MICRO NUMBER:: 11399404
SPECIMEN QUALITY:: ADEQUATE
TOXIN A AND B: NOT DETECTED

## 2020-08-31 LAB — GASTROINTESTINAL PATHOGEN PANEL PCR

## 2020-09-06 ENCOUNTER — Ambulatory Visit: Payer: Medicare Other

## 2020-09-07 DIAGNOSIS — R609 Edema, unspecified: Secondary | ICD-10-CM | POA: Diagnosis not present

## 2020-09-07 DIAGNOSIS — L309 Dermatitis, unspecified: Secondary | ICD-10-CM | POA: Diagnosis not present

## 2020-09-12 ENCOUNTER — Telehealth: Payer: Self-pay | Admitting: Family Medicine

## 2020-09-12 NOTE — Telephone Encounter (Signed)
Patients husband is calling to see if the office has received the results from the specimen that was brought in. Please give him a call back at [phone]. They are aware that Dr. Loletha Grayer will not be in the office until Wednesday.

## 2020-09-12 NOTE — Telephone Encounter (Signed)
Patient notified that some results are in the chart but haven't been reviewed yet.   Please review and advise.   Thanks.  Dm/cma

## 2020-09-13 NOTE — Telephone Encounter (Signed)
c diff was negative Levada Dy, do you know why the GI path panel was cancelled. I don't understand what the comment means. Something on our end or pts end? Looks like it needs to be repeated with new specimen?

## 2020-09-14 NOTE — Telephone Encounter (Signed)
Patients husband notified VIA phone and will come by tomorrow to pick up the specimen containers.  Dm/cma

## 2020-09-20 ENCOUNTER — Other Ambulatory Visit: Payer: Medicare Other

## 2020-09-20 DIAGNOSIS — R197 Diarrhea, unspecified: Secondary | ICD-10-CM | POA: Diagnosis not present

## 2020-09-20 DIAGNOSIS — R159 Full incontinence of feces: Secondary | ICD-10-CM

## 2020-09-28 ENCOUNTER — Telehealth: Payer: Self-pay

## 2020-09-28 LAB — GASTROINTESTINAL PATHOGEN PANEL PCR
C. difficile Tox A/B, PCR: NOT DETECTED
Campylobacter, PCR: NOT DETECTED
Cryptosporidium, PCR: NOT DETECTED
E coli (ETEC) LT/ST PCR: NOT DETECTED
E coli (STEC) stx1/stx2, PCR: NOT DETECTED
E coli 0157, PCR: NOT DETECTED
Giardia lamblia, PCR: NOT DETECTED
Norovirus, PCR: NOT DETECTED
Rotavirus A, PCR: NOT DETECTED
Salmonella, PCR: NOT DETECTED
Shigella, PCR: NOT DETECTED

## 2020-09-28 NOTE — Telephone Encounter (Signed)
Pt's husband called in regards to a sample that he left here of his wife last week and wanted to know what the results were.  I did not see any lab in from last week.  Please advise.  CB#[phone]

## 2020-09-28 NOTE — Telephone Encounter (Signed)
Advised of results. Please see other message. Dm/cma

## 2020-09-29 ENCOUNTER — Encounter: Payer: Self-pay | Admitting: Adult Health

## 2020-09-29 ENCOUNTER — Ambulatory Visit: Payer: Medicare Other | Admitting: Adult Health

## 2020-09-29 ENCOUNTER — Other Ambulatory Visit: Payer: Self-pay

## 2020-09-29 DIAGNOSIS — G4733 Obstructive sleep apnea (adult) (pediatric): Secondary | ICD-10-CM

## 2020-09-29 DIAGNOSIS — Z9989 Dependence on other enabling machines and devices: Secondary | ICD-10-CM

## 2020-09-29 NOTE — Progress Notes (Signed)
@Patient  ID: Emily Castaneda, female    DOB: [birth year], 86 y.o.   MRN: [id number]  Chief Complaint  Patient presents with  . Follow-up  . Sleep Apnea    Referring provider: Ronnald Nian, DO  HPI: 86 year old female followed for severe obstructive sleep apnea Medical history significant for bipolar disorder  TEST/EVENTS :  PSG 03/2004 showed severe OSA with RDI 75/h corrected by CPAP 17 cm, she used other interfaces before settling on nasal pillows  Hospitalization March 2021 with acute respiratory failure secondary to aspiration pneumonia  09/29/2020 Follow up : OSA  Patient presents for a follow-up visit.  Patient is underlying severe OSA.  Patient was seen last visit for a follow-up for sleep apnea.  Her machine is not working and new machine was ordered . She never received new machine.  She remains on a waiting list to do the national back order.  She did bring in her CPAP machine.  We went over how to use this.  It does appear to be working okay currently.  She did try on her mask and all her supplies are correct.  Patient education was given.  No Known Allergies  Immunization History  Administered Date(s) Administered  . Fluad Quad(high Dose 65+) 05/20/2020  . Influenza Split 09/20/2013  . Influenza, High Dose Seasonal PF 08/07/2016, 09/18/2017, 06/13/2018, 05/13/2019  . PFIZER(Purple Top)SARS-COV-2 Vaccination 10/25/2019, 11/25/2019, 09/15/2020  . Pneumococcal Conjugate-13 09/11/2013  . Pneumococcal Polysaccharide-23 02/25/2009  . Td 02/25/2009  . Zoster 12/10/2012    Past Medical History:  Diagnosis Date  . Depression    Dr. Abner Greenspan  . Diverticulosis of colon   . Endometriosis   . Hx of colonic polyps   . Hyperlipidemia   . Hypothyroidism   . Manic, depressive ([place])   . Menopausal syndrome   . OSA (obstructive sleep apnea)    cpap; 17 cm (clint young)    Tobacco History: Social History   Tobacco Use  Smoking Status Former Smoker  . Packs/day:  0.10  . Years: 10.00  . Pack years: 1.00  . Types: Cigarettes  . Quit date: 08/21/1983  . Years since quitting: 37.1  Smokeless Tobacco Never Used  Tobacco Comment   only smoked socially   Counseling given: Not Answered Comment: only smoked socially   Outpatient Medications Prior to Visit  Medication Sig Dispense Refill  . Cholecalciferol (VITAMIN D3) 2000 units TABS Take 1 capsule by mouth daily.    . Cyanocobalamin (B-12) 1000 MCG CAPS     . divalproex (DEPAKOTE ER) 250 MG 24 hr tablet Take 1,250 mg by mouth every evening.     . furosemide (LASIX) 20 MG tablet Take 1 tablet (20 mg total) by mouth daily. 90 tablet 1  . levothyroxine (SYNTHROID) 112 MCG tablet Take 1 tablet (112 mcg total) by mouth daily before breakfast. 90 tablet 0  . mirabegron ER (MYRBETRIQ) 25 MG TB24 tablet Take 1 tablet (25 mg total) by mouth daily. 90 tablet 1  . Multiple Vitamins-Minerals (PRESERVISION AREDS 2) CAPS Take 1 capsule by mouth 2 (two) times daily.    . potassium chloride SA (KLOR-CON) 20 MEQ tablet Take 1 tablet (20 mEq total) by mouth daily. 90 tablet 1  . Probiotic Product (ALIGN) 4 MG CAPS Take 1 capsule (4 mg total) by mouth daily. 90 capsule 3  . atorvastatin (LIPITOR) 10 MG tablet Take 1 tablet (10 mg total) by mouth daily. 90 tablet 0   No facility-administered medications prior to visit.  Review of Systems:   Constitutional:   No  weight loss, night sweats,  Fevers, chills, fatigue, or  lassitude.  HEENT:   No headaches,  Difficulty swallowing,  Tooth/dental problems, or  Sore throat,                No sneezing, itching, ear ache, nasal congestion, post nasal drip,   CV:  No chest pain,  Orthopnea, PND, swelling in lower extremities, anasarca, dizziness, palpitations, syncope.   GI  No heartburn, indigestion, abdominal pain, nausea, vomiting, diarrhea, change in bowel habits, loss of appetite, bloody stools.   Resp: No shortness of breath with exertion or at rest.  No excess  mucus, no productive cough,  No non-productive cough,  No coughing up of blood.  No change in color of mucus.  No wheezing.  No chest wall deformity  Skin: no rash or lesions.  GU: no dysuria, change in color of urine, no urgency or frequency.  No flank pain, no hematuria   MS:  No joint pain or swelling.  No decreased range of motion.  No back pain.    Physical Exam  BP 126/70 (BP Location: Left Arm, Patient Position: Sitting, Cuff Size: Normal)   Pulse (!) 117   Temp (!) 97.4 F (36.3 C) (Temporal)   Ht 5\' 2"  (1.575 m)   Wt 204 lb 1.6 oz (92.6 kg)   SpO2 95%   BMI 37.33 kg/m   GEN: A/Ox3; pleasant , NAD, elderly , walker    HEENT:  [place]/AT,  NOSE-clear, THROAT-clear, no lesions, no postnasal drip or exudate noted.   NECK:  Supple w/ fair ROM; no JVD; normal carotid impulses w/o bruits; no thyromegaly or nodules palpated; no lymphadenopathy.    RESP  Clear  P & A; w/o, wheezes/ rales/ or rhonchi. no accessory muscle use, no dullness to percussion  CARD:  RRR, no m/r/g, no peripheral edema, pulses intact, no cyanosis or clubbing.  GI:   Soft & nt; nml bowel sounds; no organomegaly or masses detected.   Musco: Warm bil, no deformities or joint swelling noted.   Neuro: alert, no focal deficits noted.    Skin: Warm, no lesions or rashes    Lab Results:  ProBNP No results found for: PROBNP  Imaging: No results found.    No flowsheet data found.  No results found for: NITRICOXIDE      Assessment & Plan:   OSA on CPAP Obstructive sleep apnea.  Needs to restart CPAP.  She is awaiting her new CPAP.  We will try to use her old one until her new one arrives.  Patient patient given.  Plan  Patient Instructions  Continue on C Pap at bedtime We waiting for your new CPAP .  Work on healthy weight Do not drive if sleepy CPAP download on return. Bring SD card  Follow-up with Dr. Elsworth Soho in 4 months  and as needed     Morbid obesity ([place]) Healthy weight loss  discussed     Rexene Edison, NP 09/29/2020

## 2020-09-29 NOTE — Assessment & Plan Note (Signed)
Healthy weight loss discussed 

## 2020-09-29 NOTE — Patient Instructions (Addendum)
Continue on C Pap at bedtime We waiting for your new CPAP .  Work on healthy weight Do not drive if sleepy CPAP download on return. Bring SD card  Follow-up with Dr. Elsworth Soho in 4 months  and as needed

## 2020-09-29 NOTE — Assessment & Plan Note (Addendum)
Obstructive sleep apnea.  Needs to restart CPAP.  She is awaiting her new CPAP.  We will try to use her old one until her new one arrives.  Patient patient given.  Plan  Patient Instructions  Continue on C Pap at bedtime We waiting for your new CPAP .  Work on healthy weight Do not drive if sleepy CPAP download on return. Bring SD card  Follow-up with Dr. Elsworth Soho in 4 months  and as needed

## 2020-11-03 ENCOUNTER — Telehealth: Payer: Self-pay | Admitting: Family Medicine

## 2020-11-03 DIAGNOSIS — I10 Essential (primary) hypertension: Secondary | ICD-10-CM | POA: Diagnosis not present

## 2020-11-03 NOTE — Telephone Encounter (Signed)
Spoke with patient she stated she was in the medical facility [place].  Declined AWV

## 2020-11-26 DIAGNOSIS — G4733 Obstructive sleep apnea (adult) (pediatric): Secondary | ICD-10-CM | POA: Diagnosis not present

## 2020-11-26 DIAGNOSIS — G4730 Sleep apnea, unspecified: Secondary | ICD-10-CM | POA: Diagnosis not present

## 2020-11-28 DIAGNOSIS — G4733 Obstructive sleep apnea (adult) (pediatric): Secondary | ICD-10-CM | POA: Diagnosis not present

## 2020-11-28 DIAGNOSIS — G4730 Sleep apnea, unspecified: Secondary | ICD-10-CM | POA: Diagnosis not present

## 2020-12-26 DIAGNOSIS — G4733 Obstructive sleep apnea (adult) (pediatric): Secondary | ICD-10-CM | POA: Diagnosis not present

## 2020-12-26 DIAGNOSIS — G4730 Sleep apnea, unspecified: Secondary | ICD-10-CM | POA: Diagnosis not present

## 2020-12-30 DIAGNOSIS — M5136 Other intervertebral disc degeneration, lumbar region: Secondary | ICD-10-CM | POA: Diagnosis not present

## 2020-12-30 DIAGNOSIS — M545 Low back pain, unspecified: Secondary | ICD-10-CM | POA: Diagnosis not present

## 2021-01-26 DIAGNOSIS — G4730 Sleep apnea, unspecified: Secondary | ICD-10-CM | POA: Diagnosis not present

## 2021-01-26 DIAGNOSIS — G4733 Obstructive sleep apnea (adult) (pediatric): Secondary | ICD-10-CM | POA: Diagnosis not present

## 2021-02-25 DIAGNOSIS — G4730 Sleep apnea, unspecified: Secondary | ICD-10-CM | POA: Diagnosis not present

## 2021-02-25 DIAGNOSIS — G4733 Obstructive sleep apnea (adult) (pediatric): Secondary | ICD-10-CM | POA: Diagnosis not present

## 2021-03-28 DIAGNOSIS — G4730 Sleep apnea, unspecified: Secondary | ICD-10-CM | POA: Diagnosis not present

## 2021-03-28 DIAGNOSIS — G4733 Obstructive sleep apnea (adult) (pediatric): Secondary | ICD-10-CM | POA: Diagnosis not present

## 2021-04-20 ENCOUNTER — Telehealth: Payer: Self-pay

## 2021-04-20 NOTE — Telephone Encounter (Signed)
Called [facility] @ [phone] in regards to a medication/physican orders form received. LOV 08/25/20 w/ Dr Bryan Lemma, no FOV.  Needs to make an appointment to follow up with another provider to verify medications taken. Dm/cma

## 2021-04-28 DIAGNOSIS — G4733 Obstructive sleep apnea (adult) (pediatric): Secondary | ICD-10-CM | POA: Diagnosis not present

## 2021-04-28 DIAGNOSIS — G4730 Sleep apnea, unspecified: Secondary | ICD-10-CM | POA: Diagnosis not present

## 2021-05-09 ENCOUNTER — Ambulatory Visit: Payer: Medicare Other | Admitting: Gastroenterology

## 2021-05-28 DIAGNOSIS — G4733 Obstructive sleep apnea (adult) (pediatric): Secondary | ICD-10-CM | POA: Diagnosis not present

## 2021-05-28 DIAGNOSIS — G4730 Sleep apnea, unspecified: Secondary | ICD-10-CM | POA: Diagnosis not present

## 2021-06-01 DIAGNOSIS — R609 Edema, unspecified: Secondary | ICD-10-CM | POA: Diagnosis not present

## 2021-06-01 DIAGNOSIS — R112 Nausea with vomiting, unspecified: Secondary | ICD-10-CM | POA: Diagnosis not present

## 2021-06-16 DIAGNOSIS — R5383 Other fatigue: Secondary | ICD-10-CM | POA: Diagnosis not present

## 2021-06-16 DIAGNOSIS — R197 Diarrhea, unspecified: Secondary | ICD-10-CM | POA: Diagnosis not present

## 2021-06-16 DIAGNOSIS — K5900 Constipation, unspecified: Secondary | ICD-10-CM | POA: Diagnosis not present

## 2021-06-28 DIAGNOSIS — G4730 Sleep apnea, unspecified: Secondary | ICD-10-CM | POA: Diagnosis not present

## 2021-06-28 DIAGNOSIS — G4733 Obstructive sleep apnea (adult) (pediatric): Secondary | ICD-10-CM | POA: Diagnosis not present

## 2021-07-10 DIAGNOSIS — F03A0 Unspecified dementia, mild, without behavioral disturbance, psychotic disturbance, mood disturbance, and anxiety: Secondary | ICD-10-CM | POA: Diagnosis not present

## 2021-07-10 DIAGNOSIS — E039 Hypothyroidism, unspecified: Secondary | ICD-10-CM | POA: Diagnosis not present

## 2021-07-10 DIAGNOSIS — Z7409 Other reduced mobility: Secondary | ICD-10-CM | POA: Diagnosis not present

## 2021-07-10 DIAGNOSIS — R197 Diarrhea, unspecified: Secondary | ICD-10-CM | POA: Diagnosis not present

## 2021-07-10 DIAGNOSIS — R600 Localized edema: Secondary | ICD-10-CM | POA: Diagnosis not present

## 2021-07-16 DIAGNOSIS — R059 Cough, unspecified: Secondary | ICD-10-CM | POA: Diagnosis not present

## 2021-07-16 DIAGNOSIS — R0902 Hypoxemia: Secondary | ICD-10-CM | POA: Diagnosis not present

## 2021-07-28 DIAGNOSIS — G4730 Sleep apnea, unspecified: Secondary | ICD-10-CM | POA: Diagnosis not present

## 2021-07-28 DIAGNOSIS — G4733 Obstructive sleep apnea (adult) (pediatric): Secondary | ICD-10-CM | POA: Diagnosis not present

## 2021-08-28 DIAGNOSIS — G4730 Sleep apnea, unspecified: Secondary | ICD-10-CM | POA: Diagnosis not present

## 2021-08-28 DIAGNOSIS — G4733 Obstructive sleep apnea (adult) (pediatric): Secondary | ICD-10-CM | POA: Diagnosis not present

## 2021-09-09 DIAGNOSIS — M5136 Other intervertebral disc degeneration, lumbar region: Secondary | ICD-10-CM | POA: Diagnosis not present

## 2021-09-09 DIAGNOSIS — I499 Cardiac arrhythmia, unspecified: Secondary | ICD-10-CM | POA: Diagnosis not present

## 2021-09-09 DIAGNOSIS — M19072 Primary osteoarthritis, left ankle and foot: Secondary | ICD-10-CM | POA: Diagnosis not present

## 2021-09-09 DIAGNOSIS — M2550 Pain in unspecified joint: Secondary | ICD-10-CM | POA: Diagnosis not present

## 2021-09-09 DIAGNOSIS — M7989 Other specified soft tissue disorders: Secondary | ICD-10-CM | POA: Diagnosis not present

## 2021-09-09 DIAGNOSIS — W010XXA Fall on same level from slipping, tripping and stumbling without subsequent striking against object, initial encounter: Secondary | ICD-10-CM | POA: Diagnosis not present

## 2021-09-09 DIAGNOSIS — R197 Diarrhea, unspecified: Secondary | ICD-10-CM | POA: Diagnosis not present

## 2021-09-09 DIAGNOSIS — S80919A Unspecified superficial injury of unspecified knee, initial encounter: Secondary | ICD-10-CM | POA: Diagnosis not present

## 2021-09-09 DIAGNOSIS — S99822A Other specified injuries of left foot, initial encounter: Secondary | ICD-10-CM | POA: Diagnosis not present

## 2021-09-09 DIAGNOSIS — N3090 Cystitis, unspecified without hematuria: Secondary | ICD-10-CM | POA: Diagnosis not present

## 2021-09-09 DIAGNOSIS — M2012 Hallux valgus (acquired), left foot: Secondary | ICD-10-CM | POA: Diagnosis not present

## 2021-09-09 DIAGNOSIS — R7989 Other specified abnormal findings of blood chemistry: Secondary | ICD-10-CM | POA: Diagnosis not present

## 2021-09-09 DIAGNOSIS — M79675 Pain in left toe(s): Secondary | ICD-10-CM | POA: Diagnosis not present

## 2021-09-09 DIAGNOSIS — I4510 Unspecified right bundle-branch block: Secondary | ICD-10-CM | POA: Diagnosis not present

## 2021-09-09 DIAGNOSIS — M47816 Spondylosis without myelopathy or radiculopathy, lumbar region: Secondary | ICD-10-CM | POA: Diagnosis not present

## 2021-09-09 DIAGNOSIS — Y999 Unspecified external cause status: Secondary | ICD-10-CM | POA: Diagnosis not present

## 2021-09-09 DIAGNOSIS — M25562 Pain in left knee: Secondary | ICD-10-CM | POA: Diagnosis not present

## 2021-09-09 DIAGNOSIS — M1612 Unilateral primary osteoarthritis, left hip: Secondary | ICD-10-CM | POA: Diagnosis not present

## 2021-09-09 DIAGNOSIS — S0990XA Unspecified injury of head, initial encounter: Secondary | ICD-10-CM | POA: Diagnosis not present

## 2021-09-09 DIAGNOSIS — M24152 Other articular cartilage disorders, left hip: Secondary | ICD-10-CM | POA: Diagnosis not present

## 2021-09-09 DIAGNOSIS — M1712 Unilateral primary osteoarthritis, left knee: Secondary | ICD-10-CM | POA: Diagnosis not present

## 2021-09-09 DIAGNOSIS — Z743 Need for continuous supervision: Secondary | ICD-10-CM | POA: Diagnosis not present

## 2021-09-09 DIAGNOSIS — R404 Transient alteration of awareness: Secondary | ICD-10-CM | POA: Diagnosis not present

## 2021-09-09 DIAGNOSIS — M24151 Other articular cartilage disorders, right hip: Secondary | ICD-10-CM | POA: Diagnosis not present

## 2021-09-09 DIAGNOSIS — R55 Syncope and collapse: Secondary | ICD-10-CM | POA: Diagnosis not present

## 2021-09-09 DIAGNOSIS — Z7401 Bed confinement status: Secondary | ICD-10-CM | POA: Diagnosis not present

## 2021-09-10 DIAGNOSIS — I4510 Unspecified right bundle-branch block: Secondary | ICD-10-CM | POA: Diagnosis not present

## 2021-09-14 DIAGNOSIS — R2689 Other abnormalities of gait and mobility: Secondary | ICD-10-CM | POA: Diagnosis not present

## 2021-09-14 DIAGNOSIS — M6281 Muscle weakness (generalized): Secondary | ICD-10-CM | POA: Diagnosis not present

## 2021-09-14 DIAGNOSIS — M79672 Pain in left foot: Secondary | ICD-10-CM | POA: Diagnosis not present

## 2021-09-14 DIAGNOSIS — N3080 Other cystitis without hematuria: Secondary | ICD-10-CM | POA: Diagnosis not present

## 2021-09-14 DIAGNOSIS — Z9181 History of falling: Secondary | ICD-10-CM | POA: Diagnosis not present

## 2021-09-19 DIAGNOSIS — R2689 Other abnormalities of gait and mobility: Secondary | ICD-10-CM | POA: Diagnosis not present

## 2021-09-19 DIAGNOSIS — M6281 Muscle weakness (generalized): Secondary | ICD-10-CM | POA: Diagnosis not present

## 2021-09-19 DIAGNOSIS — Z9181 History of falling: Secondary | ICD-10-CM | POA: Diagnosis not present

## 2021-09-19 DIAGNOSIS — M79672 Pain in left foot: Secondary | ICD-10-CM | POA: Diagnosis not present

## 2021-09-19 DIAGNOSIS — N3080 Other cystitis without hematuria: Secondary | ICD-10-CM | POA: Diagnosis not present

## 2021-09-20 DIAGNOSIS — R2689 Other abnormalities of gait and mobility: Secondary | ICD-10-CM | POA: Diagnosis not present

## 2021-09-20 DIAGNOSIS — N3080 Other cystitis without hematuria: Secondary | ICD-10-CM | POA: Diagnosis not present

## 2021-09-20 DIAGNOSIS — M6281 Muscle weakness (generalized): Secondary | ICD-10-CM | POA: Diagnosis not present

## 2021-09-20 DIAGNOSIS — Z9181 History of falling: Secondary | ICD-10-CM | POA: Diagnosis not present

## 2021-09-20 DIAGNOSIS — M79672 Pain in left foot: Secondary | ICD-10-CM | POA: Diagnosis not present

## 2021-09-21 DIAGNOSIS — M6281 Muscle weakness (generalized): Secondary | ICD-10-CM | POA: Diagnosis not present

## 2021-09-21 DIAGNOSIS — M79672 Pain in left foot: Secondary | ICD-10-CM | POA: Diagnosis not present

## 2021-09-21 DIAGNOSIS — N3080 Other cystitis without hematuria: Secondary | ICD-10-CM | POA: Diagnosis not present

## 2021-09-21 DIAGNOSIS — Z9181 History of falling: Secondary | ICD-10-CM | POA: Diagnosis not present

## 2021-09-21 DIAGNOSIS — R2689 Other abnormalities of gait and mobility: Secondary | ICD-10-CM | POA: Diagnosis not present

## 2021-09-25 DIAGNOSIS — M79672 Pain in left foot: Secondary | ICD-10-CM | POA: Diagnosis not present

## 2021-09-25 DIAGNOSIS — N3080 Other cystitis without hematuria: Secondary | ICD-10-CM | POA: Diagnosis not present

## 2021-09-25 DIAGNOSIS — Z9181 History of falling: Secondary | ICD-10-CM | POA: Diagnosis not present

## 2021-09-25 DIAGNOSIS — R2689 Other abnormalities of gait and mobility: Secondary | ICD-10-CM | POA: Diagnosis not present

## 2021-09-25 DIAGNOSIS — M6281 Muscle weakness (generalized): Secondary | ICD-10-CM | POA: Diagnosis not present

## 2021-09-27 DIAGNOSIS — R2689 Other abnormalities of gait and mobility: Secondary | ICD-10-CM | POA: Diagnosis not present

## 2021-09-27 DIAGNOSIS — Z9181 History of falling: Secondary | ICD-10-CM | POA: Diagnosis not present

## 2021-09-27 DIAGNOSIS — M6281 Muscle weakness (generalized): Secondary | ICD-10-CM | POA: Diagnosis not present

## 2021-09-27 DIAGNOSIS — N3080 Other cystitis without hematuria: Secondary | ICD-10-CM | POA: Diagnosis not present

## 2021-09-27 DIAGNOSIS — M79672 Pain in left foot: Secondary | ICD-10-CM | POA: Diagnosis not present

## 2021-09-28 DIAGNOSIS — M79672 Pain in left foot: Secondary | ICD-10-CM | POA: Diagnosis not present

## 2021-09-28 DIAGNOSIS — N3080 Other cystitis without hematuria: Secondary | ICD-10-CM | POA: Diagnosis not present

## 2021-09-28 DIAGNOSIS — R2689 Other abnormalities of gait and mobility: Secondary | ICD-10-CM | POA: Diagnosis not present

## 2021-09-28 DIAGNOSIS — Z9181 History of falling: Secondary | ICD-10-CM | POA: Diagnosis not present

## 2021-09-28 DIAGNOSIS — M6281 Muscle weakness (generalized): Secondary | ICD-10-CM | POA: Diagnosis not present

## 2021-10-03 DIAGNOSIS — R2689 Other abnormalities of gait and mobility: Secondary | ICD-10-CM | POA: Diagnosis not present

## 2021-10-03 DIAGNOSIS — M79672 Pain in left foot: Secondary | ICD-10-CM | POA: Diagnosis not present

## 2021-10-03 DIAGNOSIS — N3080 Other cystitis without hematuria: Secondary | ICD-10-CM | POA: Diagnosis not present

## 2021-10-03 DIAGNOSIS — Z9181 History of falling: Secondary | ICD-10-CM | POA: Diagnosis not present

## 2021-10-03 DIAGNOSIS — M6281 Muscle weakness (generalized): Secondary | ICD-10-CM | POA: Diagnosis not present

## 2021-10-04 DIAGNOSIS — M6281 Muscle weakness (generalized): Secondary | ICD-10-CM | POA: Diagnosis not present

## 2021-10-04 DIAGNOSIS — M79672 Pain in left foot: Secondary | ICD-10-CM | POA: Diagnosis not present

## 2021-10-04 DIAGNOSIS — R2689 Other abnormalities of gait and mobility: Secondary | ICD-10-CM | POA: Diagnosis not present

## 2021-10-04 DIAGNOSIS — N3080 Other cystitis without hematuria: Secondary | ICD-10-CM | POA: Diagnosis not present

## 2021-10-04 DIAGNOSIS — Z9181 History of falling: Secondary | ICD-10-CM | POA: Diagnosis not present

## 2021-10-05 DIAGNOSIS — M79672 Pain in left foot: Secondary | ICD-10-CM | POA: Diagnosis not present

## 2021-10-05 DIAGNOSIS — Z9181 History of falling: Secondary | ICD-10-CM | POA: Diagnosis not present

## 2021-10-05 DIAGNOSIS — M6281 Muscle weakness (generalized): Secondary | ICD-10-CM | POA: Diagnosis not present

## 2021-10-05 DIAGNOSIS — N3080 Other cystitis without hematuria: Secondary | ICD-10-CM | POA: Diagnosis not present

## 2021-10-05 DIAGNOSIS — R2689 Other abnormalities of gait and mobility: Secondary | ICD-10-CM | POA: Diagnosis not present

## 2021-11-15 DIAGNOSIS — E785 Hyperlipidemia, unspecified: Secondary | ICD-10-CM | POA: Diagnosis not present

## 2021-11-15 DIAGNOSIS — K5900 Constipation, unspecified: Secondary | ICD-10-CM | POA: Diagnosis not present

## 2021-11-15 DIAGNOSIS — R609 Edema, unspecified: Secondary | ICD-10-CM | POA: Diagnosis not present

## 2021-11-15 DIAGNOSIS — L03032 Cellulitis of left toe: Secondary | ICD-10-CM | POA: Diagnosis not present

## 2021-11-15 DIAGNOSIS — M159 Polyosteoarthritis, unspecified: Secondary | ICD-10-CM | POA: Diagnosis not present

## 2021-11-15 DIAGNOSIS — Z7409 Other reduced mobility: Secondary | ICD-10-CM | POA: Diagnosis not present

## 2021-11-15 DIAGNOSIS — F03A0 Unspecified dementia, mild, without behavioral disturbance, psychotic disturbance, mood disturbance, and anxiety: Secondary | ICD-10-CM | POA: Diagnosis not present

## 2021-11-15 DIAGNOSIS — N3281 Overactive bladder: Secondary | ICD-10-CM | POA: Diagnosis not present

## 2021-11-15 DIAGNOSIS — E039 Hypothyroidism, unspecified: Secondary | ICD-10-CM | POA: Diagnosis not present

## 2022-01-22 DIAGNOSIS — R0981 Nasal congestion: Secondary | ICD-10-CM | POA: Diagnosis not present

## 2022-01-22 DIAGNOSIS — R059 Cough, unspecified: Secondary | ICD-10-CM | POA: Diagnosis not present

## 2022-01-26 DIAGNOSIS — L309 Dermatitis, unspecified: Secondary | ICD-10-CM | POA: Diagnosis not present

## 2022-01-29 DIAGNOSIS — R0981 Nasal congestion: Secondary | ICD-10-CM | POA: Diagnosis not present

## 2022-01-29 DIAGNOSIS — R059 Cough, unspecified: Secondary | ICD-10-CM | POA: Diagnosis not present

## 2022-03-01 DIAGNOSIS — R609 Edema, unspecified: Secondary | ICD-10-CM | POA: Diagnosis not present

## 2022-03-01 DIAGNOSIS — K5900 Constipation, unspecified: Secondary | ICD-10-CM | POA: Diagnosis not present

## 2022-03-01 DIAGNOSIS — E039 Hypothyroidism, unspecified: Secondary | ICD-10-CM | POA: Diagnosis not present

## 2022-03-01 DIAGNOSIS — M159 Polyosteoarthritis, unspecified: Secondary | ICD-10-CM | POA: Diagnosis not present

## 2022-03-01 DIAGNOSIS — Z7409 Other reduced mobility: Secondary | ICD-10-CM | POA: Diagnosis not present

## 2022-03-01 DIAGNOSIS — E785 Hyperlipidemia, unspecified: Secondary | ICD-10-CM | POA: Diagnosis not present

## 2022-03-01 DIAGNOSIS — F03A0 Unspecified dementia, mild, without behavioral disturbance, psychotic disturbance, mood disturbance, and anxiety: Secondary | ICD-10-CM | POA: Diagnosis not present

## 2022-03-01 DIAGNOSIS — N3281 Overactive bladder: Secondary | ICD-10-CM | POA: Diagnosis not present

## 2022-04-08 DIAGNOSIS — M25522 Pain in left elbow: Secondary | ICD-10-CM | POA: Diagnosis not present

## 2022-04-10 DIAGNOSIS — M79642 Pain in left hand: Secondary | ICD-10-CM | POA: Diagnosis not present

## 2022-04-10 DIAGNOSIS — R2232 Localized swelling, mass and lump, left upper limb: Secondary | ICD-10-CM | POA: Diagnosis not present

## 2022-04-10 DIAGNOSIS — M799 Soft tissue disorder, unspecified: Secondary | ICD-10-CM | POA: Diagnosis not present

## 2022-04-10 DIAGNOSIS — R609 Edema, unspecified: Secondary | ICD-10-CM | POA: Diagnosis not present

## 2022-04-10 DIAGNOSIS — M79602 Pain in left arm: Secondary | ICD-10-CM | POA: Diagnosis not present

## 2022-04-10 DIAGNOSIS — S4992XA Unspecified injury of left shoulder and upper arm, initial encounter: Secondary | ICD-10-CM | POA: Diagnosis not present

## 2022-05-24 DIAGNOSIS — M25552 Pain in left hip: Secondary | ICD-10-CM | POA: Diagnosis not present

## 2022-05-24 DIAGNOSIS — F03A0 Unspecified dementia, mild, without behavioral disturbance, psychotic disturbance, mood disturbance, and anxiety: Secondary | ICD-10-CM | POA: Diagnosis not present

## 2022-05-24 DIAGNOSIS — M6281 Muscle weakness (generalized): Secondary | ICD-10-CM | POA: Diagnosis not present

## 2022-05-24 DIAGNOSIS — R2689 Other abnormalities of gait and mobility: Secondary | ICD-10-CM | POA: Diagnosis not present

## 2022-05-28 DIAGNOSIS — R2689 Other abnormalities of gait and mobility: Secondary | ICD-10-CM | POA: Diagnosis not present

## 2022-05-28 DIAGNOSIS — M6281 Muscle weakness (generalized): Secondary | ICD-10-CM | POA: Diagnosis not present

## 2022-05-28 DIAGNOSIS — F03A0 Unspecified dementia, mild, without behavioral disturbance, psychotic disturbance, mood disturbance, and anxiety: Secondary | ICD-10-CM | POA: Diagnosis not present

## 2022-05-28 DIAGNOSIS — M25552 Pain in left hip: Secondary | ICD-10-CM | POA: Diagnosis not present

## 2022-05-31 DIAGNOSIS — R2689 Other abnormalities of gait and mobility: Secondary | ICD-10-CM | POA: Diagnosis not present

## 2022-05-31 DIAGNOSIS — M6281 Muscle weakness (generalized): Secondary | ICD-10-CM | POA: Diagnosis not present

## 2022-05-31 DIAGNOSIS — M25552 Pain in left hip: Secondary | ICD-10-CM | POA: Diagnosis not present

## 2022-05-31 DIAGNOSIS — F03A0 Unspecified dementia, mild, without behavioral disturbance, psychotic disturbance, mood disturbance, and anxiety: Secondary | ICD-10-CM | POA: Diagnosis not present

## 2022-06-04 DIAGNOSIS — F03A0 Unspecified dementia, mild, without behavioral disturbance, psychotic disturbance, mood disturbance, and anxiety: Secondary | ICD-10-CM | POA: Diagnosis not present

## 2022-06-04 DIAGNOSIS — M6281 Muscle weakness (generalized): Secondary | ICD-10-CM | POA: Diagnosis not present

## 2022-06-04 DIAGNOSIS — R2689 Other abnormalities of gait and mobility: Secondary | ICD-10-CM | POA: Diagnosis not present

## 2022-06-04 DIAGNOSIS — M25552 Pain in left hip: Secondary | ICD-10-CM | POA: Diagnosis not present

## 2022-06-06 DIAGNOSIS — R2689 Other abnormalities of gait and mobility: Secondary | ICD-10-CM | POA: Diagnosis not present

## 2022-06-06 DIAGNOSIS — M6281 Muscle weakness (generalized): Secondary | ICD-10-CM | POA: Diagnosis not present

## 2022-06-06 DIAGNOSIS — F03A0 Unspecified dementia, mild, without behavioral disturbance, psychotic disturbance, mood disturbance, and anxiety: Secondary | ICD-10-CM | POA: Diagnosis not present

## 2022-06-06 DIAGNOSIS — M25552 Pain in left hip: Secondary | ICD-10-CM | POA: Diagnosis not present

## 2022-06-08 DIAGNOSIS — F03A0 Unspecified dementia, mild, without behavioral disturbance, psychotic disturbance, mood disturbance, and anxiety: Secondary | ICD-10-CM | POA: Diagnosis not present

## 2022-06-08 DIAGNOSIS — M6281 Muscle weakness (generalized): Secondary | ICD-10-CM | POA: Diagnosis not present

## 2022-06-08 DIAGNOSIS — R2689 Other abnormalities of gait and mobility: Secondary | ICD-10-CM | POA: Diagnosis not present

## 2022-06-08 DIAGNOSIS — M25552 Pain in left hip: Secondary | ICD-10-CM | POA: Diagnosis not present

## 2022-06-11 DIAGNOSIS — F03A0 Unspecified dementia, mild, without behavioral disturbance, psychotic disturbance, mood disturbance, and anxiety: Secondary | ICD-10-CM | POA: Diagnosis not present

## 2022-06-11 DIAGNOSIS — M25552 Pain in left hip: Secondary | ICD-10-CM | POA: Diagnosis not present

## 2022-06-11 DIAGNOSIS — R2689 Other abnormalities of gait and mobility: Secondary | ICD-10-CM | POA: Diagnosis not present

## 2022-06-11 DIAGNOSIS — M6281 Muscle weakness (generalized): Secondary | ICD-10-CM | POA: Diagnosis not present

## 2022-06-12 DIAGNOSIS — M6281 Muscle weakness (generalized): Secondary | ICD-10-CM | POA: Diagnosis not present

## 2022-06-12 DIAGNOSIS — M25552 Pain in left hip: Secondary | ICD-10-CM | POA: Diagnosis not present

## 2022-06-12 DIAGNOSIS — R2689 Other abnormalities of gait and mobility: Secondary | ICD-10-CM | POA: Diagnosis not present

## 2022-06-12 DIAGNOSIS — F03A0 Unspecified dementia, mild, without behavioral disturbance, psychotic disturbance, mood disturbance, and anxiety: Secondary | ICD-10-CM | POA: Diagnosis not present

## 2022-06-14 DIAGNOSIS — R2689 Other abnormalities of gait and mobility: Secondary | ICD-10-CM | POA: Diagnosis not present

## 2022-06-14 DIAGNOSIS — M6281 Muscle weakness (generalized): Secondary | ICD-10-CM | POA: Diagnosis not present

## 2022-06-14 DIAGNOSIS — M25552 Pain in left hip: Secondary | ICD-10-CM | POA: Diagnosis not present

## 2022-06-14 DIAGNOSIS — F03A0 Unspecified dementia, mild, without behavioral disturbance, psychotic disturbance, mood disturbance, and anxiety: Secondary | ICD-10-CM | POA: Diagnosis not present

## 2022-06-18 DIAGNOSIS — R2689 Other abnormalities of gait and mobility: Secondary | ICD-10-CM | POA: Diagnosis not present

## 2022-06-18 DIAGNOSIS — M25552 Pain in left hip: Secondary | ICD-10-CM | POA: Diagnosis not present

## 2022-06-18 DIAGNOSIS — M6281 Muscle weakness (generalized): Secondary | ICD-10-CM | POA: Diagnosis not present

## 2022-06-18 DIAGNOSIS — F03A0 Unspecified dementia, mild, without behavioral disturbance, psychotic disturbance, mood disturbance, and anxiety: Secondary | ICD-10-CM | POA: Diagnosis not present

## 2022-06-21 DIAGNOSIS — R2689 Other abnormalities of gait and mobility: Secondary | ICD-10-CM | POA: Diagnosis not present

## 2022-06-21 DIAGNOSIS — F03A0 Unspecified dementia, mild, without behavioral disturbance, psychotic disturbance, mood disturbance, and anxiety: Secondary | ICD-10-CM | POA: Diagnosis not present

## 2022-06-21 DIAGNOSIS — M25552 Pain in left hip: Secondary | ICD-10-CM | POA: Diagnosis not present

## 2022-06-21 DIAGNOSIS — M6281 Muscle weakness (generalized): Secondary | ICD-10-CM | POA: Diagnosis not present

## 2022-07-16 DIAGNOSIS — R600 Localized edema: Secondary | ICD-10-CM | POA: Diagnosis not present

## 2022-07-16 DIAGNOSIS — R2689 Other abnormalities of gait and mobility: Secondary | ICD-10-CM | POA: Diagnosis not present

## 2022-07-16 DIAGNOSIS — N3281 Overactive bladder: Secondary | ICD-10-CM | POA: Diagnosis not present

## 2022-07-16 DIAGNOSIS — E785 Hyperlipidemia, unspecified: Secondary | ICD-10-CM | POA: Diagnosis not present

## 2022-07-16 DIAGNOSIS — R7303 Prediabetes: Secondary | ICD-10-CM | POA: Diagnosis not present

## 2022-07-16 DIAGNOSIS — F03918 Unspecified dementia, unspecified severity, with other behavioral disturbance: Secondary | ICD-10-CM | POA: Diagnosis not present

## 2022-07-19 DIAGNOSIS — M6281 Muscle weakness (generalized): Secondary | ICD-10-CM | POA: Diagnosis not present

## 2022-07-19 DIAGNOSIS — R609 Edema, unspecified: Secondary | ICD-10-CM | POA: Diagnosis not present

## 2022-07-19 DIAGNOSIS — R7309 Other abnormal glucose: Secondary | ICD-10-CM | POA: Diagnosis not present

## 2022-08-29 DIAGNOSIS — H2513 Age-related nuclear cataract, bilateral: Secondary | ICD-10-CM | POA: Diagnosis not present

## 2022-08-29 DIAGNOSIS — H52223 Regular astigmatism, bilateral: Secondary | ICD-10-CM | POA: Diagnosis not present

## 2022-08-29 DIAGNOSIS — H353131 Nonexudative age-related macular degeneration, bilateral, early dry stage: Secondary | ICD-10-CM | POA: Diagnosis not present

## 2022-08-29 DIAGNOSIS — H524 Presbyopia: Secondary | ICD-10-CM | POA: Diagnosis not present

## 2022-09-25 DIAGNOSIS — D1801 Hemangioma of skin and subcutaneous tissue: Secondary | ICD-10-CM | POA: Diagnosis not present

## 2022-09-25 DIAGNOSIS — L821 Other seborrheic keratosis: Secondary | ICD-10-CM | POA: Diagnosis not present

## 2022-09-25 DIAGNOSIS — L853 Xerosis cutis: Secondary | ICD-10-CM | POA: Diagnosis not present

## 2022-09-25 DIAGNOSIS — I872 Venous insufficiency (chronic) (peripheral): Secondary | ICD-10-CM | POA: Diagnosis not present

## 2022-09-25 DIAGNOSIS — L814 Other melanin hyperpigmentation: Secondary | ICD-10-CM | POA: Diagnosis not present

## 2022-10-31 DIAGNOSIS — R0989 Other specified symptoms and signs involving the circulatory and respiratory systems: Secondary | ICD-10-CM | POA: Diagnosis not present

## 2022-10-31 DIAGNOSIS — R059 Cough, unspecified: Secondary | ICD-10-CM | POA: Diagnosis not present

## 2022-11-13 DIAGNOSIS — N3281 Overactive bladder: Secondary | ICD-10-CM | POA: Diagnosis not present

## 2022-11-13 DIAGNOSIS — E039 Hypothyroidism, unspecified: Secondary | ICD-10-CM | POA: Diagnosis not present

## 2022-11-13 DIAGNOSIS — R600 Localized edema: Secondary | ICD-10-CM | POA: Diagnosis not present

## 2022-11-13 DIAGNOSIS — Z7409 Other reduced mobility: Secondary | ICD-10-CM | POA: Diagnosis not present

## 2022-11-13 DIAGNOSIS — R7303 Prediabetes: Secondary | ICD-10-CM | POA: Diagnosis not present

## 2022-11-13 DIAGNOSIS — E785 Hyperlipidemia, unspecified: Secondary | ICD-10-CM | POA: Diagnosis not present

## 2022-11-13 DIAGNOSIS — M159 Polyosteoarthritis, unspecified: Secondary | ICD-10-CM | POA: Diagnosis not present

## 2022-11-13 DIAGNOSIS — K5900 Constipation, unspecified: Secondary | ICD-10-CM | POA: Diagnosis not present

## 2022-12-24 DIAGNOSIS — L600 Ingrowing nail: Secondary | ICD-10-CM | POA: Diagnosis not present

## 2022-12-24 DIAGNOSIS — B351 Tinea unguium: Secondary | ICD-10-CM | POA: Diagnosis not present

## 2022-12-24 DIAGNOSIS — L603 Nail dystrophy: Secondary | ICD-10-CM | POA: Diagnosis not present

## 2023-01-08 DIAGNOSIS — M6281 Muscle weakness (generalized): Secondary | ICD-10-CM | POA: Diagnosis not present

## 2023-01-08 DIAGNOSIS — R2689 Other abnormalities of gait and mobility: Secondary | ICD-10-CM | POA: Diagnosis not present

## 2023-01-08 DIAGNOSIS — R52 Pain, unspecified: Secondary | ICD-10-CM | POA: Diagnosis not present

## 2023-01-08 DIAGNOSIS — R2681 Unsteadiness on feet: Secondary | ICD-10-CM | POA: Diagnosis not present

## 2023-01-11 DIAGNOSIS — R2689 Other abnormalities of gait and mobility: Secondary | ICD-10-CM | POA: Diagnosis not present

## 2023-01-11 DIAGNOSIS — R2681 Unsteadiness on feet: Secondary | ICD-10-CM | POA: Diagnosis not present

## 2023-01-11 DIAGNOSIS — M6281 Muscle weakness (generalized): Secondary | ICD-10-CM | POA: Diagnosis not present

## 2023-01-11 DIAGNOSIS — R52 Pain, unspecified: Secondary | ICD-10-CM | POA: Diagnosis not present

## 2023-01-15 DIAGNOSIS — M6281 Muscle weakness (generalized): Secondary | ICD-10-CM | POA: Diagnosis not present

## 2023-01-15 DIAGNOSIS — R52 Pain, unspecified: Secondary | ICD-10-CM | POA: Diagnosis not present

## 2023-01-15 DIAGNOSIS — R2681 Unsteadiness on feet: Secondary | ICD-10-CM | POA: Diagnosis not present

## 2023-01-15 DIAGNOSIS — R2689 Other abnormalities of gait and mobility: Secondary | ICD-10-CM | POA: Diagnosis not present

## 2023-01-16 DIAGNOSIS — R2681 Unsteadiness on feet: Secondary | ICD-10-CM | POA: Diagnosis not present

## 2023-01-16 DIAGNOSIS — M6281 Muscle weakness (generalized): Secondary | ICD-10-CM | POA: Diagnosis not present

## 2023-01-16 DIAGNOSIS — R52 Pain, unspecified: Secondary | ICD-10-CM | POA: Diagnosis not present

## 2023-01-16 DIAGNOSIS — R2689 Other abnormalities of gait and mobility: Secondary | ICD-10-CM | POA: Diagnosis not present

## 2023-01-18 DIAGNOSIS — R52 Pain, unspecified: Secondary | ICD-10-CM | POA: Diagnosis not present

## 2023-01-18 DIAGNOSIS — M6281 Muscle weakness (generalized): Secondary | ICD-10-CM | POA: Diagnosis not present

## 2023-01-18 DIAGNOSIS — R2681 Unsteadiness on feet: Secondary | ICD-10-CM | POA: Diagnosis not present

## 2023-01-18 DIAGNOSIS — R2689 Other abnormalities of gait and mobility: Secondary | ICD-10-CM | POA: Diagnosis not present

## 2023-01-21 DIAGNOSIS — M6281 Muscle weakness (generalized): Secondary | ICD-10-CM | POA: Diagnosis not present

## 2023-01-21 DIAGNOSIS — R2689 Other abnormalities of gait and mobility: Secondary | ICD-10-CM | POA: Diagnosis not present

## 2023-01-21 DIAGNOSIS — R2681 Unsteadiness on feet: Secondary | ICD-10-CM | POA: Diagnosis not present

## 2023-01-21 DIAGNOSIS — R52 Pain, unspecified: Secondary | ICD-10-CM | POA: Diagnosis not present

## 2023-01-23 DIAGNOSIS — R2681 Unsteadiness on feet: Secondary | ICD-10-CM | POA: Diagnosis not present

## 2023-01-23 DIAGNOSIS — R52 Pain, unspecified: Secondary | ICD-10-CM | POA: Diagnosis not present

## 2023-01-23 DIAGNOSIS — R2689 Other abnormalities of gait and mobility: Secondary | ICD-10-CM | POA: Diagnosis not present

## 2023-01-23 DIAGNOSIS — M6281 Muscle weakness (generalized): Secondary | ICD-10-CM | POA: Diagnosis not present

## 2023-01-24 DIAGNOSIS — R2689 Other abnormalities of gait and mobility: Secondary | ICD-10-CM | POA: Diagnosis not present

## 2023-01-24 DIAGNOSIS — R52 Pain, unspecified: Secondary | ICD-10-CM | POA: Diagnosis not present

## 2023-01-24 DIAGNOSIS — R2681 Unsteadiness on feet: Secondary | ICD-10-CM | POA: Diagnosis not present

## 2023-01-24 DIAGNOSIS — M6281 Muscle weakness (generalized): Secondary | ICD-10-CM | POA: Diagnosis not present

## 2023-01-29 DIAGNOSIS — R2689 Other abnormalities of gait and mobility: Secondary | ICD-10-CM | POA: Diagnosis not present

## 2023-01-29 DIAGNOSIS — R2681 Unsteadiness on feet: Secondary | ICD-10-CM | POA: Diagnosis not present

## 2023-01-29 DIAGNOSIS — R52 Pain, unspecified: Secondary | ICD-10-CM | POA: Diagnosis not present

## 2023-01-29 DIAGNOSIS — M6281 Muscle weakness (generalized): Secondary | ICD-10-CM | POA: Diagnosis not present

## 2023-01-31 DIAGNOSIS — R52 Pain, unspecified: Secondary | ICD-10-CM | POA: Diagnosis not present

## 2023-01-31 DIAGNOSIS — M6281 Muscle weakness (generalized): Secondary | ICD-10-CM | POA: Diagnosis not present

## 2023-01-31 DIAGNOSIS — R2689 Other abnormalities of gait and mobility: Secondary | ICD-10-CM | POA: Diagnosis not present

## 2023-01-31 DIAGNOSIS — R2681 Unsteadiness on feet: Secondary | ICD-10-CM | POA: Diagnosis not present

## 2023-02-04 DIAGNOSIS — R2681 Unsteadiness on feet: Secondary | ICD-10-CM | POA: Diagnosis not present

## 2023-02-04 DIAGNOSIS — R52 Pain, unspecified: Secondary | ICD-10-CM | POA: Diagnosis not present

## 2023-02-04 DIAGNOSIS — M6281 Muscle weakness (generalized): Secondary | ICD-10-CM | POA: Diagnosis not present

## 2023-02-04 DIAGNOSIS — R2689 Other abnormalities of gait and mobility: Secondary | ICD-10-CM | POA: Diagnosis not present

## 2023-02-06 DIAGNOSIS — R2681 Unsteadiness on feet: Secondary | ICD-10-CM | POA: Diagnosis not present

## 2023-02-06 DIAGNOSIS — R52 Pain, unspecified: Secondary | ICD-10-CM | POA: Diagnosis not present

## 2023-02-06 DIAGNOSIS — M6281 Muscle weakness (generalized): Secondary | ICD-10-CM | POA: Diagnosis not present

## 2023-02-06 DIAGNOSIS — R2689 Other abnormalities of gait and mobility: Secondary | ICD-10-CM | POA: Diagnosis not present

## 2023-02-08 DIAGNOSIS — R2681 Unsteadiness on feet: Secondary | ICD-10-CM | POA: Diagnosis not present

## 2023-02-08 DIAGNOSIS — M6281 Muscle weakness (generalized): Secondary | ICD-10-CM | POA: Diagnosis not present

## 2023-02-08 DIAGNOSIS — R52 Pain, unspecified: Secondary | ICD-10-CM | POA: Diagnosis not present

## 2023-02-08 DIAGNOSIS — R2689 Other abnormalities of gait and mobility: Secondary | ICD-10-CM | POA: Diagnosis not present

## 2023-02-19 DIAGNOSIS — I872 Venous insufficiency (chronic) (peripheral): Secondary | ICD-10-CM | POA: Diagnosis not present

## 2023-03-25 DIAGNOSIS — I872 Venous insufficiency (chronic) (peripheral): Secondary | ICD-10-CM | POA: Diagnosis not present

## 2023-03-28 DIAGNOSIS — N189 Chronic kidney disease, unspecified: Secondary | ICD-10-CM | POA: Diagnosis not present

## 2023-03-28 DIAGNOSIS — Z9989 Dependence on other enabling machines and devices: Secondary | ICD-10-CM | POA: Diagnosis not present

## 2023-03-28 DIAGNOSIS — E039 Hypothyroidism, unspecified: Secondary | ICD-10-CM | POA: Diagnosis not present

## 2023-03-28 DIAGNOSIS — R269 Unspecified abnormalities of gait and mobility: Secondary | ICD-10-CM | POA: Diagnosis not present

## 2023-03-28 DIAGNOSIS — G4733 Obstructive sleep apnea (adult) (pediatric): Secondary | ICD-10-CM | POA: Diagnosis not present

## 2023-03-28 DIAGNOSIS — I890 Lymphedema, not elsewhere classified: Secondary | ICD-10-CM | POA: Diagnosis not present

## 2023-04-05 DIAGNOSIS — R310 Gross hematuria: Secondary | ICD-10-CM | POA: Diagnosis not present

## 2023-04-05 DIAGNOSIS — I872 Venous insufficiency (chronic) (peripheral): Secondary | ICD-10-CM | POA: Diagnosis not present

## 2023-05-02 DIAGNOSIS — R21 Rash and other nonspecific skin eruption: Secondary | ICD-10-CM | POA: Diagnosis not present

## 2023-05-26 ENCOUNTER — Emergency Department (HOSPITAL_COMMUNITY): Payer: Medicare Other

## 2023-05-26 ENCOUNTER — Other Ambulatory Visit: Payer: Self-pay

## 2023-05-26 ENCOUNTER — Inpatient Hospital Stay (HOSPITAL_COMMUNITY)
Admission: EM | Admit: 2023-05-26 | Discharge: 2023-06-21 | DRG: 871 | Disposition: E | Payer: Medicare Other | Source: Skilled Nursing Facility | Attending: Critical Care Medicine | Admitting: Critical Care Medicine

## 2023-05-26 ENCOUNTER — Encounter (HOSPITAL_COMMUNITY): Payer: Self-pay

## 2023-05-26 DIAGNOSIS — I700 Atherosclerosis of aorta: Secondary | ICD-10-CM | POA: Diagnosis not present

## 2023-05-26 DIAGNOSIS — E039 Hypothyroidism, unspecified: Secondary | ICD-10-CM | POA: Diagnosis not present

## 2023-05-26 DIAGNOSIS — F3130 Bipolar disorder, current episode depressed, mild or moderate severity, unspecified: Secondary | ICD-10-CM | POA: Diagnosis present

## 2023-05-26 DIAGNOSIS — Z515 Encounter for palliative care: Secondary | ICD-10-CM | POA: Diagnosis not present

## 2023-05-26 DIAGNOSIS — R6521 Severe sepsis with septic shock: Secondary | ICD-10-CM | POA: Diagnosis not present

## 2023-05-26 DIAGNOSIS — G9341 Metabolic encephalopathy: Secondary | ICD-10-CM | POA: Diagnosis not present

## 2023-05-26 DIAGNOSIS — Z4682 Encounter for fitting and adjustment of non-vascular catheter: Secondary | ICD-10-CM | POA: Diagnosis not present

## 2023-05-26 DIAGNOSIS — F0393 Unspecified dementia, unspecified severity, with mood disturbance: Secondary | ICD-10-CM | POA: Diagnosis present

## 2023-05-26 DIAGNOSIS — Z87891 Personal history of nicotine dependence: Secondary | ICD-10-CM | POA: Diagnosis not present

## 2023-05-26 DIAGNOSIS — E872 Acidosis, unspecified: Secondary | ICD-10-CM | POA: Diagnosis not present

## 2023-05-26 DIAGNOSIS — D649 Anemia, unspecified: Secondary | ICD-10-CM | POA: Diagnosis not present

## 2023-05-26 DIAGNOSIS — R34 Anuria and oliguria: Secondary | ICD-10-CM | POA: Diagnosis not present

## 2023-05-26 DIAGNOSIS — N390 Urinary tract infection, site not specified: Secondary | ICD-10-CM | POA: Diagnosis not present

## 2023-05-26 DIAGNOSIS — Z1152 Encounter for screening for COVID-19: Secondary | ICD-10-CM

## 2023-05-26 DIAGNOSIS — R739 Hyperglycemia, unspecified: Secondary | ICD-10-CM | POA: Diagnosis present

## 2023-05-26 DIAGNOSIS — R4781 Slurred speech: Secondary | ICD-10-CM | POA: Diagnosis not present

## 2023-05-26 DIAGNOSIS — E785 Hyperlipidemia, unspecified: Secondary | ICD-10-CM | POA: Diagnosis present

## 2023-05-26 DIAGNOSIS — Z743 Need for continuous supervision: Secondary | ICD-10-CM | POA: Diagnosis not present

## 2023-05-26 DIAGNOSIS — Z66 Do not resuscitate: Secondary | ICD-10-CM | POA: Diagnosis not present

## 2023-05-26 DIAGNOSIS — Z79890 Hormone replacement therapy (postmenopausal): Secondary | ICD-10-CM | POA: Diagnosis not present

## 2023-05-26 DIAGNOSIS — I878 Other specified disorders of veins: Secondary | ICD-10-CM | POA: Diagnosis present

## 2023-05-26 DIAGNOSIS — A419 Sepsis, unspecified organism: Secondary | ICD-10-CM | POA: Diagnosis not present

## 2023-05-26 DIAGNOSIS — Z833 Family history of diabetes mellitus: Secondary | ICD-10-CM

## 2023-05-26 DIAGNOSIS — J9601 Acute respiratory failure with hypoxia: Secondary | ICD-10-CM | POA: Diagnosis not present

## 2023-05-26 DIAGNOSIS — G4733 Obstructive sleep apnea (adult) (pediatric): Secondary | ICD-10-CM | POA: Diagnosis not present

## 2023-05-26 DIAGNOSIS — R918 Other nonspecific abnormal finding of lung field: Secondary | ICD-10-CM | POA: Diagnosis not present

## 2023-05-26 DIAGNOSIS — F319 Bipolar disorder, unspecified: Secondary | ICD-10-CM | POA: Diagnosis present

## 2023-05-26 DIAGNOSIS — R61 Generalized hyperhidrosis: Secondary | ICD-10-CM | POA: Diagnosis not present

## 2023-05-26 DIAGNOSIS — Z8249 Family history of ischemic heart disease and other diseases of the circulatory system: Secondary | ICD-10-CM

## 2023-05-26 DIAGNOSIS — Z803 Family history of malignant neoplasm of breast: Secondary | ICD-10-CM

## 2023-05-26 DIAGNOSIS — R9431 Abnormal electrocardiogram [ECG] [EKG]: Secondary | ICD-10-CM | POA: Diagnosis not present

## 2023-05-26 DIAGNOSIS — G9389 Other specified disorders of brain: Secondary | ICD-10-CM | POA: Diagnosis not present

## 2023-05-26 DIAGNOSIS — I517 Cardiomegaly: Secondary | ICD-10-CM | POA: Diagnosis not present

## 2023-05-26 DIAGNOSIS — I4510 Unspecified right bundle-branch block: Secondary | ICD-10-CM | POA: Diagnosis not present

## 2023-05-26 DIAGNOSIS — E876 Hypokalemia: Secondary | ICD-10-CM | POA: Diagnosis present

## 2023-05-26 DIAGNOSIS — F32A Depression, unspecified: Secondary | ICD-10-CM | POA: Diagnosis present

## 2023-05-26 DIAGNOSIS — R110 Nausea: Secondary | ICD-10-CM | POA: Diagnosis not present

## 2023-05-26 DIAGNOSIS — Z79899 Other long term (current) drug therapy: Secondary | ICD-10-CM

## 2023-05-26 DIAGNOSIS — R5381 Other malaise: Secondary | ICD-10-CM | POA: Diagnosis present

## 2023-05-26 DIAGNOSIS — N179 Acute kidney failure, unspecified: Secondary | ICD-10-CM

## 2023-05-26 LAB — VALPROIC ACID LEVEL: Valproic Acid Lvl: 56 ug/mL (ref 50.0–100.0)

## 2023-05-26 LAB — URINALYSIS, W/ REFLEX TO CULTURE (INFECTION SUSPECTED)
Bilirubin Urine: NEGATIVE
Glucose, UA: NEGATIVE mg/dL
Ketones, ur: 5 mg/dL — AB
Nitrite: NEGATIVE
Protein, ur: 30 mg/dL — AB
Specific Gravity, Urine: 1.018 (ref 1.005–1.030)
WBC, UA: >50 WBC/hpf (ref 0–5)
pH: 5 (ref 5.0–8.0)

## 2023-05-26 LAB — CBC WITH DIFFERENTIAL/PLATELET
Abs Immature Granulocytes: 0.05 10*3/uL (ref 0.00–0.07)
Basophils Absolute: 0 10*3/uL (ref 0.0–0.1)
Basophils Relative: 0 %
Eosinophils Absolute: 0 10*3/uL (ref 0.0–0.5)
Eosinophils Relative: 0 %
HCT: 40.4 % (ref 36.0–46.0)
Hemoglobin: 13.1 g/dL (ref 12.0–15.0)
Immature Granulocytes: 1 %
Lymphocytes Relative: 7 %
Lymphs Abs: 0.6 10*3/uL — ABNORMAL LOW (ref 0.7–4.0)
MCH: 31.5 pg (ref 26.0–34.0)
MCHC: 32.4 g/dL (ref 30.0–36.0)
MCV: 97.1 fL (ref 80.0–100.0)
Monocytes Absolute: 0.1 10*3/uL (ref 0.1–1.0)
Monocytes Relative: 1 %
Neutro Abs: 8.6 10*3/uL — ABNORMAL HIGH (ref 1.7–7.7)
Neutrophils Relative %: 91 %
Platelets: 194 10*3/uL (ref 150–400)
RBC: 4.16 MIL/uL (ref 3.87–5.11)
RDW: 13.5 % (ref 11.5–15.5)
WBC: 9.3 10*3/uL (ref 4.0–10.5)
nRBC: 0 % (ref 0.0–0.2)

## 2023-05-26 LAB — COMPREHENSIVE METABOLIC PANEL
ALT: 17 U/L (ref 0–44)
AST: 28 U/L (ref 15–41)
Albumin: 2.8 g/dL — ABNORMAL LOW (ref 3.5–5.0)
Alkaline Phosphatase: 62 U/L (ref 38–126)
Anion gap: 17 — ABNORMAL HIGH (ref 5–15)
BUN: 22 mg/dL (ref 8–23)
CO2: 22 mmol/L (ref 22–32)
Calcium: 8.9 mg/dL (ref 8.9–10.3)
Chloride: 104 mmol/L (ref 98–111)
Creatinine, Ser: 1.21 mg/dL — ABNORMAL HIGH (ref 0.44–1.00)
GFR, Estimated: 44 mL/min — ABNORMAL LOW (ref >60)
Glucose, Bld: 133 mg/dL — ABNORMAL HIGH (ref 70–99)
Potassium: 3.5 mmol/L (ref 3.5–5.1)
Sodium: 143 mmol/L (ref 135–145)
Total Bilirubin: 0.3 mg/dL (ref 0.3–1.2)
Total Protein: 6.1 g/dL — ABNORMAL LOW (ref 6.5–8.1)

## 2023-05-26 LAB — RESP PANEL BY RT-PCR (RSV, FLU A&B, COVID)  RVPGX2
Influenza A by PCR: NEGATIVE
Influenza B by PCR: NEGATIVE
Resp Syncytial Virus by PCR: NEGATIVE
SARS Coronavirus 2 by RT PCR: NEGATIVE

## 2023-05-26 LAB — APTT: aPTT: 25 s (ref 24–36)

## 2023-05-26 LAB — I-STAT CG4 LACTIC ACID, ED
Lactic Acid, Venous: 4.3 mmol/L (ref 0.5–1.9)
Lactic Acid, Venous: 3.7 mmol/L (ref 0.5–1.9)

## 2023-05-26 LAB — PROTIME-INR
INR: 1.1 (ref 0.8–1.2)
Prothrombin Time: 14.6 s (ref 11.4–15.2)

## 2023-05-26 MED ORDER — SODIUM CHLORIDE 0.9 % IV BOLUS
1000.0000 mL | Freq: Once | INTRAVENOUS | Status: AC
  Administered 2023-05-26: 1000 mL via INTRAVENOUS

## 2023-05-26 MED ORDER — SODIUM CHLORIDE 0.9 % IV SOLN
2.0000 g | INTRAVENOUS | Status: DC
  Administered 2023-05-26: 2 g via INTRAVENOUS
  Filled 2023-05-26: qty 20

## 2023-05-26 MED ORDER — ACETAMINOPHEN 500 MG PO TABS
1000.0000 mg | ORAL_TABLET | Freq: Once | ORAL | Status: AC
  Administered 2023-05-26: 1000 mg via ORAL
  Filled 2023-05-26: qty 2

## 2023-05-26 MED ORDER — LACTATED RINGERS IV SOLN: INTRAVENOUS | Status: DC

## 2023-05-26 MED ORDER — SODIUM CHLORIDE 0.9 % IV BOLUS
800.0000 mL | Freq: Once | INTRAVENOUS | Status: AC
  Administered 2023-05-26: 800 mL via INTRAVENOUS

## 2023-05-26 MED ORDER — SODIUM CHLORIDE 0.9 % IV BOLUS
1000.0000 mL | Freq: Once | INTRAVENOUS | Status: AC
  Administered 2023-05-26 – 2023-05-27 (×2): 1000 mL via INTRAVENOUS

## 2023-05-26 NOTE — Progress Notes (Signed)
[facility] following for sepsis protocol. 

## 2023-05-26 NOTE — ED Provider Notes (Signed)
[place] EMERGENCY DEPARTMENT AT [facility] Provider Note   CSN: [id number] Arrival date & time: 05/27/2023  1622     History  Chief Complaint  Patient presents with   Tremors   Emesis   Altered Mental Status    Emily Castaneda is a 88 y.o. female.   Emesis Altered Mental Status Associated symptoms: vomiting    This patient is a 88 year old female presenting from urgent care where she stays with her husband after having some onset of tremor and weakness today.  She has had some vomiting as well.  The patient has a history of hypothyroidism, chronic swelling of the lower extremities for which she wears compression stockings, he is currently cared for by an independent physician at the facility where she lives.  The patient does have a history of impaired mobility as well as mild dementia and bipolar disorder, history of falls  She has a rivastigmine patch on her left upper posterior shoulder  Evidently the patient has some degree of dementia, received her flu vaccination yesterday    Home Medications Prior to Admission medications   Medication Sig Start Date End Date Taking? Authorizing Provider  atorvastatin (LIPITOR) 10 MG tablet Take 1 tablet (10 mg total) by mouth daily. 04/29/20 07/28/20  Cirigliano, Jearld Lesch, DO  Cholecalciferol (VITAMIN D3) 2000 units TABS Take 1 capsule by mouth daily.    [provider]  Cyanocobalamin (B-12) 1000 MCG CAPS     [provider]  divalproex (DEPAKOTE ER) 250 MG 24 hr tablet Take 1,250 mg by mouth every evening.     [provider]  furosemide (LASIX) 20 MG tablet Take 1 tablet (20 mg total) by mouth daily. 02/10/20   Cirigliano, Jearld Lesch, DO  levothyroxine (SYNTHROID) 112 MCG tablet Take 1 tablet (112 mcg total) by mouth daily before breakfast. 04/29/20   Cirigliano, Jearld Lesch, DO  mirabegron ER (MYRBETRIQ) 25 MG TB24 tablet Take 1 tablet (25 mg total) by mouth daily. 08/25/20   Cirigliano, Jearld Lesch, DO  Multiple  Vitamins-Minerals (PRESERVISION AREDS 2) CAPS Take 1 capsule by mouth 2 (two) times daily.    [provider]  potassium chloride SA (KLOR-CON) 20 MEQ tablet Take 1 tablet (20 mEq total) by mouth daily. 02/10/20   Cirigliano, Jearld Lesch, DO  Probiotic Product (ALIGN) 4 MG CAPS Take 1 capsule (4 mg total) by mouth daily. 08/25/20   Overton Mam, DO      Allergies    Patient has no known allergies.    Review of Systems   Review of Systems  Gastrointestinal:  Positive for vomiting.  All other systems reviewed and are negative.   Physical Exam Updated Vital Signs BP 139/67   Pulse (!) 115   Temp (!) 101 F (38.3 C) (Rectal)   Resp (!) 24   SpO2 98%  Physical Exam Vitals and nursing note reviewed.  Constitutional:      General: She is not in acute distress.    Appearance: She is well-developed.  HENT:     Head: Normocephalic and atraumatic.     Nose: No congestion or rhinorrhea.     Mouth/Throat:     Pharynx: No oropharyngeal exudate.  Eyes:     General: No scleral icterus.       Right eye: No discharge.        Left eye: No discharge.     Conjunctiva/sclera: Conjunctivae normal.     Pupils: Pupils are equal, round, and reactive  premature complex Right bundle branch block Inferior infarct, old Artifact in lead(s) I II III aVR aVL aVF V1 V4 V5 Confirmed by Eber Hong ([id number]) on 06/08/2023 4:55:33 PM  Radiology DG Chest Port 1 View  Result Date: 06/07/2023 CLINICAL DATA:  Sepsis EXAM: PORTABLE CHEST 1 VIEW COMPARISON:  None Available. FINDINGS: Lungs are well expanded, symmetric, and clear. No pneumothorax or pleural effusion. Cardiac size within normal limits. Pulmonary vascularity is normal. Osseous structures are age-appropriate. No acute bone abnormality. IMPRESSION: No active disease. Electronically Signed   By: Helyn Numbers M.D.   On: 06/04/2023 19:01    Procedures .Critical Care  Performed by: Eber Hong, MD Authorized by: Eber Hong, MD   Critical care provider statement:    Critical care time (minutes):  45   Critical care time was exclusive of:  Separately billable procedures and treating other patients   Critical care was necessary to treat or prevent imminent or life-threatening deterioration of the following conditions:  Sepsis   Critical care was time spent personally by me on the following activities:  Development of treatment plan with patient or surrogate, discussions with consultants, evaluation of patient's response to treatment, examination of patient, obtaining history from patient or surrogate, review of old charts, re-evaluation of patient's condition, pulse oximetry, ordering and review of radiographic studies, ordering and review of laboratory studies and ordering and performing treatments and interventions   I assumed direction of critical care for this patient from another provider in my specialty: no     Care discussed  with: admitting provider   Comments:           Medications Ordered in ED Medications  lactated ringers infusion ( Intravenous New Bag/Given 06/12/2023 1709)  cefTRIAXone (ROCEPHIN) 2 g in sodium chloride 0.9 % 100 mL IVPB (0 g Intravenous Stopped 06/06/2023 1738)  sodium chloride 0.9 % bolus 1,000 mL (1,000 mLs Intravenous New Bag/Given 06/17/2023 1737)  sodium chloride 0.9 % bolus 1,000 mL (1,000 mLs Intravenous New Bag/Given 06/08/2023 1737)  sodium chloride 0.9 % bolus 800 mL (800 mLs Intravenous New Bag/Given 05/30/2023 1737)  acetaminophen (TYLENOL) tablet 1,000 mg (1,000 mg Oral Given 06/01/2023 1900)    ED Course/ Medical Decision Making/ A&P                                 Medical Decision Making Amount and/or Complexity of Data Reviewed Labs: ordered. Radiology: ordered. ECG/medicine tests: ordered.  Risk OTC drugs. Prescription drug management. Decision regarding hospitalization.    This patient presents to the ED for concern of tremor weakness and episode of vomiting, this involves an extensive number of treatment options, and is a complaint that carries with it a high risk of complications and morbidity.  The differential diagnosis includes cellulitis from the lower extremities though this could just be chronic stasis dermatitis however the legs are warm and red and bilaterally edematous, they are symmetrical   Co morbidities that complicate the patient evaluation  Dementia, cannot answer questions with any degree of certainty   Additional history obtained:  Additional history obtained from med record External records from outside source obtained and reviewed including office visits   Lab Tests:  I Ordered, and personally interpreted labs.  The pertinent results include: CBC without a leukocytosis, lactate greater than 4, repeat lactate was 3.7, urinalysis consistent with infection   Imaging Studies ordered:  I ordered imaging studies including chest x-ray I  premature complex Right bundle branch block Inferior infarct, old Artifact in lead(s) I II III aVR aVL aVF V1 V4 V5 Confirmed by Eber Hong ([id number]) on 06/08/2023 4:55:33 PM  Radiology DG Chest Port 1 View  Result Date: 06/07/2023 CLINICAL DATA:  Sepsis EXAM: PORTABLE CHEST 1 VIEW COMPARISON:  None Available. FINDINGS: Lungs are well expanded, symmetric, and clear. No pneumothorax or pleural effusion. Cardiac size within normal limits. Pulmonary vascularity is normal. Osseous structures are age-appropriate. No acute bone abnormality. IMPRESSION: No active disease. Electronically Signed   By: Helyn Numbers M.D.   On: 06/04/2023 19:01    Procedures .Critical Care  Performed by: Eber Hong, MD Authorized by: Eber Hong, MD   Critical care provider statement:    Critical care time (minutes):  45   Critical care time was exclusive of:  Separately billable procedures and treating other patients   Critical care was necessary to treat or prevent imminent or life-threatening deterioration of the following conditions:  Sepsis   Critical care was time spent personally by me on the following activities:  Development of treatment plan with patient or surrogate, discussions with consultants, evaluation of patient's response to treatment, examination of patient, obtaining history from patient or surrogate, review of old charts, re-evaluation of patient's condition, pulse oximetry, ordering and review of radiographic studies, ordering and review of laboratory studies and ordering and performing treatments and interventions   I assumed direction of critical care for this patient from another provider in my specialty: no     Care discussed  with: admitting provider   Comments:           Medications Ordered in ED Medications  lactated ringers infusion ( Intravenous New Bag/Given 06/12/2023 1709)  cefTRIAXone (ROCEPHIN) 2 g in sodium chloride 0.9 % 100 mL IVPB (0 g Intravenous Stopped 06/06/2023 1738)  sodium chloride 0.9 % bolus 1,000 mL (1,000 mLs Intravenous New Bag/Given 06/17/2023 1737)  sodium chloride 0.9 % bolus 1,000 mL (1,000 mLs Intravenous New Bag/Given 06/08/2023 1737)  sodium chloride 0.9 % bolus 800 mL (800 mLs Intravenous New Bag/Given 05/30/2023 1737)  acetaminophen (TYLENOL) tablet 1,000 mg (1,000 mg Oral Given 06/01/2023 1900)    ED Course/ Medical Decision Making/ A&P                                 Medical Decision Making Amount and/or Complexity of Data Reviewed Labs: ordered. Radiology: ordered. ECG/medicine tests: ordered.  Risk OTC drugs. Prescription drug management. Decision regarding hospitalization.    This patient presents to the ED for concern of tremor weakness and episode of vomiting, this involves an extensive number of treatment options, and is a complaint that carries with it a high risk of complications and morbidity.  The differential diagnosis includes cellulitis from the lower extremities though this could just be chronic stasis dermatitis however the legs are warm and red and bilaterally edematous, they are symmetrical   Co morbidities that complicate the patient evaluation  Dementia, cannot answer questions with any degree of certainty   Additional history obtained:  Additional history obtained from med record External records from outside source obtained and reviewed including office visits   Lab Tests:  I Ordered, and personally interpreted labs.  The pertinent results include: CBC without a leukocytosis, lactate greater than 4, repeat lactate was 3.7, urinalysis consistent with infection   Imaging Studies ordered:  I ordered imaging studies including chest x-ray I  [place] EMERGENCY DEPARTMENT AT [facility] Provider Note   CSN: [id number] Arrival date & time: 05/27/2023  1622     History  Chief Complaint  Patient presents with   Tremors   Emesis   Altered Mental Status    Emily Castaneda is a 88 y.o. female.   Emesis Altered Mental Status Associated symptoms: vomiting    This patient is a 88 year old female presenting from urgent care where she stays with her husband after having some onset of tremor and weakness today.  She has had some vomiting as well.  The patient has a history of hypothyroidism, chronic swelling of the lower extremities for which she wears compression stockings, he is currently cared for by an independent physician at the facility where she lives.  The patient does have a history of impaired mobility as well as mild dementia and bipolar disorder, history of falls  She has a rivastigmine patch on her left upper posterior shoulder  Evidently the patient has some degree of dementia, received her flu vaccination yesterday    Home Medications Prior to Admission medications   Medication Sig Start Date End Date Taking? Authorizing Provider  atorvastatin (LIPITOR) 10 MG tablet Take 1 tablet (10 mg total) by mouth daily. 04/29/20 07/28/20  Cirigliano, Jearld Lesch, DO  Cholecalciferol (VITAMIN D3) 2000 units TABS Take 1 capsule by mouth daily.    [provider]  Cyanocobalamin (B-12) 1000 MCG CAPS     [provider]  divalproex (DEPAKOTE ER) 250 MG 24 hr tablet Take 1,250 mg by mouth every evening.     [provider]  furosemide (LASIX) 20 MG tablet Take 1 tablet (20 mg total) by mouth daily. 02/10/20   Cirigliano, Jearld Lesch, DO  levothyroxine (SYNTHROID) 112 MCG tablet Take 1 tablet (112 mcg total) by mouth daily before breakfast. 04/29/20   Cirigliano, Jearld Lesch, DO  mirabegron ER (MYRBETRIQ) 25 MG TB24 tablet Take 1 tablet (25 mg total) by mouth daily. 08/25/20   Cirigliano, Jearld Lesch, DO  Multiple  Vitamins-Minerals (PRESERVISION AREDS 2) CAPS Take 1 capsule by mouth 2 (two) times daily.    [provider]  potassium chloride SA (KLOR-CON) 20 MEQ tablet Take 1 tablet (20 mEq total) by mouth daily. 02/10/20   Cirigliano, Jearld Lesch, DO  Probiotic Product (ALIGN) 4 MG CAPS Take 1 capsule (4 mg total) by mouth daily. 08/25/20   Overton Mam, DO      Allergies    Patient has no known allergies.    Review of Systems   Review of Systems  Gastrointestinal:  Positive for vomiting.  All other systems reviewed and are negative.   Physical Exam Updated Vital Signs BP 139/67   Pulse (!) 115   Temp (!) 101 F (38.3 C) (Rectal)   Resp (!) 24   SpO2 98%  Physical Exam Vitals and nursing note reviewed.  Constitutional:      General: She is not in acute distress.    Appearance: She is well-developed.  HENT:     Head: Normocephalic and atraumatic.     Nose: No congestion or rhinorrhea.     Mouth/Throat:     Pharynx: No oropharyngeal exudate.  Eyes:     General: No scleral icterus.       Right eye: No discharge.        Left eye: No discharge.     Conjunctiva/sclera: Conjunctivae normal.     Pupils: Pupils are equal, round, and reactive

## 2023-05-26 NOTE — H&P (Signed)
History and Physical    Patient: Emily Castaneda:[id number] DOB: [birth year] DOA: 05/21/2023 DOS: the patient was seen and examined on 05/25/2023 PCP: Judi Saa, DO  Patient coming from: Home  Chief Complaint:  Chief Complaint  Patient presents with   Tremors   Emesis   Altered Mental Status   HPI: Emily Castaneda is a 88 y.o. female with medical history significant of obstructive sleep apnea, depression, hypothyroidism, hyperlipidemia, who was brought in from assisted living facility with slow speech and tremors.  Patient is weak and debilitated.  Patient was altered.  Evaluation in the ER shows patient has met sepsis criteria.  She has a temperature of 101 and heart rate of 115.  Also hypoxia with oxygen rate of 79% on room air.  Lactic acid 4.3.  Creatinine is 1.21.  Urinalysis showed leukocytosis white count more than 50 bacteria rare.  Chest x-ray showed no active disease.  Patient is therefore being admitted with sepsis secondary to UTI.  Review of Systems: As mentioned in the history of present illness. All other systems reviewed and are negative. Past Medical History:  Diagnosis Date   Depression    Dr. Raquel James   Diverticulosis of colon    Endometriosis    Hx of colonic polyps    Hyperlipidemia    Hypothyroidism    Manic, depressive (HCC)    Menopausal syndrome    OSA (obstructive sleep apnea)    cpap; 17 cm (clint young)   Past Surgical History:  Procedure Laterality Date   APPENDECTOMY     COLONOSCOPY     POLYPECTOMY     Dr. Nicholas Lose   TONSILLECTOMY AND ADENOIDECTOMY     Social History:  reports that she quit smoking about 39 years ago. Her smoking use included cigarettes. She started smoking about 49 years ago. She has a 1 pack-year smoking history. She has never used smokeless tobacco. She reports current alcohol use of about 1.0 - 2.0 standard drink of alcohol per week. She reports that she does not use drugs.  No Known Allergies  Family History  Problem  Relation Age of Onset   Diabetes Mother    Heart disease Father        complications of coronary heart disease   Diabetes Brother    Heart disease Other    Cancer Other        BREAST AND LUNG    Breast cancer Maternal Aunt    Cancer Maternal Grandfather        LUNG   Breast cancer Daughter    Colon cancer Neg Hx    Rectal cancer Neg Hx    Stomach cancer Neg Hx     Prior to Admission medications   Medication Sig Start Date End Date Taking? Authorizing Provider  atorvastatin (LIPITOR) 10 MG tablet Take 1 tablet (10 mg total) by mouth daily. 04/29/20 07/28/20  Cirigliano, Jearld Lesch, DO  Cholecalciferol (VITAMIN D3) 2000 units TABS Take 1 capsule by mouth daily.    [provider]  Cyanocobalamin (B-12) 1000 MCG CAPS     [provider]  divalproex (DEPAKOTE ER) 250 MG 24 hr tablet Take 1,250 mg by mouth every evening.     [provider]  furosemide (LASIX) 20 MG tablet Take 1 tablet (20 mg total) by mouth daily. 02/10/20   Overton Mam, DO  levothyroxine (SYNTHROID) 112 MCG tablet Take 1 tablet (112 mcg total) by mouth daily before breakfast. 04/29/20   Luana Shu  K, DO  mirabegron ER (MYRBETRIQ) 25 MG TB24 tablet Take 1 tablet (25 mg total) by mouth daily. 08/25/20   Cirigliano, Jearld Lesch, DO  Multiple Vitamins-Minerals (PRESERVISION AREDS 2) CAPS Take 1 capsule by mouth 2 (two) times daily.    [provider]  potassium chloride SA (KLOR-CON) 20 MEQ tablet Take 1 tablet (20 mEq total) by mouth daily. 02/10/20   Cirigliano, Jearld Lesch, DO  Probiotic Product (ALIGN) 4 MG CAPS Take 1 capsule (4 mg total) by mouth daily. 08/25/20   Overton Mam, DO    Physical Exam: Vitals:   2023-06-19 1637 June 19, 2023 1642 2023-06-19 1658 2023/06/19 1800  BP:  125/66  139/67  Pulse: (!) 108   (!) 115  Resp: (!) 25   (!) 24  Temp:   (!) 101 F (38.3 C)   TempSrc:   Rectal   SpO2: 99%   98%   Constitutional: Confused, agitated, NAD, calm, comfortable Eyes: PERRL, lids  and conjunctivae normal ENMT: Mucous membranes are dry. Posterior pharynx clear of any exudate or lesions.Normal dentition.  Neck: normal, supple, no masses, no thyromegaly Respiratory: clear to auscultation bilaterally, no wheezing, no crackles. Normal respiratory effort. No accessory muscle use.  Cardiovascular: Sinus tachycardia, no murmurs / rubs / gallops. No extremity edema. 2+ pedal pulses. No carotid bruits.  Abdomen: no tenderness, no masses palpated. No hepatosplenomegaly. Bowel sounds positive.  Musculoskeletal: Good range of motion, no joint swelling or tenderness, Skin: no rashes, lesions, ulcers. No induration Neurologic: CN 2-12 grossly intact. Sensation intact, DTR normal. Strength 5/5 in all 4.  Psychiatric: Confused, agitated  Data Reviewed:  Urinalysis showed WBC more than 50 with bacteremia leukocytes positive nitrite negative.  Lactic acid 4.3, glucose 133, creatinine 1.21 acute viral screen is negative.  Chest x-ray showed no active disease  Assessment and Plan:  #1 sepsis due to UTI: Patient will have blood and urine cultures obtained.  IV antibiotics, fluids, supportive care.  Empiric Rocephin.  Based on culture results we will adjust antibiotics.  #2 hypothyroidism: Continue levothyroxine  #3 hyperlipidemia: Continue home statin  #4 acute metabolic encephalopathy: Secondary to UTI and sepsis.  Continue the supportive care  #5 morbid obesity: Dietary counseling.  #6 bipolar disorder: Will confirm and resume home regimen  #7 obstructive sleep apnea: On CPAP at night    Advance Care Planning:   Code Status: Prior full code  Consults: None  Family Communication: No family at bedside  Severity of Illness: The appropriate patient status for this patient is INPATIENT. Inpatient status is judged to be reasonable and necessary in order to provide the required intensity of service to ensure the patient's safety. The patient's presenting symptoms, physical exam  findings, and initial radiographic and laboratory data in the context of their chronic comorbidities is felt to place them at high risk for further clinical deterioration. Furthermore, it is not anticipated that the patient will be medically stable for discharge from the hospital within 2 midnights of admission.   * I certify that at the point of admission it is my clinical judgment that the patient will require inpatient hospital care spanning beyond 2 midnights from the point of admission due to high intensity of service, high risk for further deterioration and high frequency of surveillance required.*  AuthorLonia Blood, MD 19-Jun-2023 8:46 PM  For on call review [email or web address].

## 2023-05-26 NOTE — ED Triage Notes (Signed)
From [facility] facility Slurred speech Tremors Per facility  No neuro defecits per ems  Flu shot yesterday  Pt arrives pale, per ems ecg all over the place   avg 95-100 bpm Cbc 153  Bp 140/80 02 97% RA  No chest pain Small vomiting  Confused at this time, unknown of normal baseline

## 2023-05-27 ENCOUNTER — Inpatient Hospital Stay (HOSPITAL_COMMUNITY): Payer: Medicare Other

## 2023-05-27 DIAGNOSIS — N179 Acute kidney failure, unspecified: Secondary | ICD-10-CM | POA: Diagnosis not present

## 2023-05-27 DIAGNOSIS — J9601 Acute respiratory failure with hypoxia: Secondary | ICD-10-CM

## 2023-05-27 DIAGNOSIS — R6521 Severe sepsis with septic shock: Secondary | ICD-10-CM

## 2023-05-27 DIAGNOSIS — N390 Urinary tract infection, site not specified: Secondary | ICD-10-CM | POA: Diagnosis not present

## 2023-05-27 DIAGNOSIS — E872 Acidosis, unspecified: Secondary | ICD-10-CM

## 2023-05-27 DIAGNOSIS — G9341 Metabolic encephalopathy: Secondary | ICD-10-CM

## 2023-05-27 DIAGNOSIS — A419 Sepsis, unspecified organism: Secondary | ICD-10-CM | POA: Diagnosis not present

## 2023-05-27 LAB — COMPREHENSIVE METABOLIC PANEL
ALT: 17 U/L (ref 0–44)
AST: 34 U/L (ref 15–41)
Albumin: 2.4 g/dL — ABNORMAL LOW (ref 3.5–5.0)
Alkaline Phosphatase: 71 U/L (ref 38–126)
Anion gap: 16 — ABNORMAL HIGH (ref 5–15)
BUN: 20 mg/dL (ref 8–23)
CO2: 17 mmol/L — ABNORMAL LOW (ref 22–32)
Calcium: 8.5 mg/dL — ABNORMAL LOW (ref 8.9–10.3)
Chloride: 108 mmol/L (ref 98–111)
Creatinine, Ser: 1.4 mg/dL — ABNORMAL HIGH (ref 0.44–1.00)
GFR, Estimated: 37 mL/min — ABNORMAL LOW (ref >60)
Glucose, Bld: 117 mg/dL — ABNORMAL HIGH (ref 70–99)
Potassium: 3.9 mmol/L (ref 3.5–5.1)
Sodium: 141 mmol/L (ref 135–145)
Total Bilirubin: 0.5 mg/dL (ref 0.3–1.2)
Total Protein: 6 g/dL — ABNORMAL LOW (ref 6.5–8.1)

## 2023-05-27 LAB — BASIC METABOLIC PANEL
Anion gap: 19 — ABNORMAL HIGH (ref 5–15)
Anion gap: 16 — ABNORMAL HIGH (ref 5–15)
BUN: 24 mg/dL — ABNORMAL HIGH (ref 8–23)
BUN: 25 mg/dL — ABNORMAL HIGH (ref 8–23)
CO2: 14 mmol/L — ABNORMAL LOW (ref 22–32)
CO2: 16 mmol/L — ABNORMAL LOW (ref 22–32)
Calcium: 8.2 mg/dL — ABNORMAL LOW (ref 8.9–10.3)
Calcium: 7.8 mg/dL — ABNORMAL LOW (ref 8.9–10.3)
Chloride: 111 mmol/L (ref 98–111)
Chloride: 109 mmol/L (ref 98–111)
Creatinine, Ser: 2.04 mg/dL — ABNORMAL HIGH (ref 0.44–1.00)
Creatinine, Ser: 2.31 mg/dL — ABNORMAL HIGH (ref 0.44–1.00)
GFR, Estimated: 23 mL/min — ABNORMAL LOW (ref >60)
GFR, Estimated: 20 mL/min — ABNORMAL LOW (ref >60)
Glucose, Bld: 115 mg/dL — ABNORMAL HIGH (ref 70–99)
Glucose, Bld: 115 mg/dL — ABNORMAL HIGH (ref 70–99)
Potassium: 2.9 mmol/L — ABNORMAL LOW (ref 3.5–5.1)
Potassium: 4.2 mmol/L (ref 3.5–5.1)
Sodium: 144 mmol/L (ref 135–145)
Sodium: 141 mmol/L (ref 135–145)

## 2023-05-27 LAB — GLUCOSE, CAPILLARY
Glucose-Capillary: 101 mg/dL — ABNORMAL HIGH (ref 70–99)
Glucose-Capillary: 115 mg/dL — ABNORMAL HIGH (ref 70–99)
Glucose-Capillary: 114 mg/dL — ABNORMAL HIGH (ref 70–99)
Glucose-Capillary: 133 mg/dL — ABNORMAL HIGH (ref 70–99)

## 2023-05-27 LAB — POCT I-STAT 7, (LYTES, BLD GAS, ICA,H+H)
Acid-base deficit: 9 mmol/L — ABNORMAL HIGH (ref 0.0–2.0)
Bicarbonate: 16.6 mmol/L — ABNORMAL LOW (ref 20.0–28.0)
Calcium, Ion: 1.18 mmol/L (ref 1.15–1.40)
HCT: 36 % (ref 36.0–46.0)
Hemoglobin: 12.2 g/dL (ref 12.0–15.0)
O2 Saturation: 100 %
Patient temperature: 99.2
Potassium: 3.2 mmol/L — ABNORMAL LOW (ref 3.5–5.1)
Sodium: 144 mmol/L (ref 135–145)
TCO2: 18 mmol/L — ABNORMAL LOW (ref 22–32)
pCO2 arterial: 35.6 mm[Hg] (ref 32–48)
pH, Arterial: 7.279 — ABNORMAL LOW (ref 7.35–7.45)
pO2, Arterial: 350 mm[Hg] — ABNORMAL HIGH (ref 83–108)

## 2023-05-27 LAB — CBC
HCT: 40.8 % (ref 36.0–46.0)
Hemoglobin: 13.4 g/dL (ref 12.0–15.0)
MCH: 32 pg (ref 26.0–34.0)
MCHC: 32.8 g/dL (ref 30.0–36.0)
MCV: 97.4 fL (ref 80.0–100.0)
Platelets: 156 10*3/uL (ref 150–400)
RBC: 4.19 MIL/uL (ref 3.87–5.11)
RDW: 13.7 % (ref 11.5–15.5)
WBC: 2 10*3/uL — ABNORMAL LOW (ref 4.0–10.5)
nRBC: 1.5 % — ABNORMAL HIGH (ref 0.0–0.2)

## 2023-05-27 LAB — I-STAT ARTERIAL BLOOD GAS, ED
Acid-base deficit: 12 mmol/L — ABNORMAL HIGH (ref 0.0–2.0)
Bicarbonate: 13.3 mmol/L — ABNORMAL LOW (ref 20.0–28.0)
Calcium, Ion: 1.19 mmol/L (ref 1.15–1.40)
HCT: 37 % (ref 36.0–46.0)
Hemoglobin: 12.6 g/dL (ref 12.0–15.0)
O2 Saturation: 92 %
Potassium: 3.3 mmol/L — ABNORMAL LOW (ref 3.5–5.1)
Sodium: 143 mmol/L (ref 135–145)
TCO2: 14 mmol/L — ABNORMAL LOW (ref 22–32)
pCO2 arterial: 27.1 mm[Hg] — ABNORMAL LOW (ref 32–48)
pH, Arterial: 7.298 — ABNORMAL LOW (ref 7.35–7.45)
pO2, Arterial: 68 mm[Hg] — ABNORMAL LOW (ref 83–108)

## 2023-05-27 LAB — CORTISOL-AM, BLOOD: Cortisol - AM: 40.9 ug/dL — ABNORMAL HIGH (ref 6.7–22.6)

## 2023-05-27 LAB — AMMONIA: Ammonia: 33 umol/L (ref 9–35)

## 2023-05-27 LAB — URINE CULTURE: Culture: <10000 — AB

## 2023-05-27 LAB — I-STAT VENOUS BLOOD GAS, ED
Acid-base deficit: 11 mmol/L — ABNORMAL HIGH (ref 0.0–2.0)
Bicarbonate: 12.4 mmol/L — ABNORMAL LOW (ref 20.0–28.0)
Calcium, Ion: 1.02 mmol/L — ABNORMAL LOW (ref 1.15–1.40)
HCT: 33 % — ABNORMAL LOW (ref 36.0–46.0)
Hemoglobin: 11.2 g/dL — ABNORMAL LOW (ref 12.0–15.0)
O2 Saturation: 93 %
Potassium: 3.3 mmol/L — ABNORMAL LOW (ref 3.5–5.1)
Sodium: 142 mmol/L (ref 135–145)
TCO2: 13 mmol/L — ABNORMAL LOW (ref 22–32)
pCO2, Ven: 22.4 mm[Hg] — ABNORMAL LOW (ref 44–60)
pH, Ven: 7.351 (ref 7.25–7.43)
pO2, Ven: 67 mm[Hg] — ABNORMAL HIGH (ref 32–45)

## 2023-05-27 LAB — MRSA NEXT GEN BY PCR, NASAL: MRSA by PCR Next Gen: NOT DETECTED

## 2023-05-27 LAB — LACTIC ACID, PLASMA
Lactic Acid, Venous: 6.6 mmol/L (ref 0.5–1.9)
Lactic Acid, Venous: 8.7 mmol/L (ref 0.5–1.9)
Lactic Acid, Venous: 7.5 mmol/L (ref 0.5–1.9)
Lactic Acid, Venous: 8.2 mmol/L (ref 0.5–1.9)
Lactic Acid, Venous: 7.9 mmol/L (ref 0.5–1.9)

## 2023-05-27 LAB — TSH: TSH: 6.958 u[IU]/mL — ABNORMAL HIGH (ref 0.350–4.500)

## 2023-05-27 LAB — PROTIME-INR
INR: 1.2 (ref 0.8–1.2)
Prothrombin Time: 14.9 s (ref 11.4–15.2)

## 2023-05-27 LAB — CBG MONITORING, ED: Glucose-Capillary: 118 mg/dL — ABNORMAL HIGH (ref 70–99)

## 2023-05-27 LAB — BRAIN NATRIURETIC PEPTIDE: B Natriuretic Peptide: 218.8 pg/mL — ABNORMAL HIGH (ref 0.0–100.0)

## 2023-05-27 LAB — PROCALCITONIN: Procalcitonin: 23.01 ng/mL

## 2023-05-27 LAB — T4, FREE: Free T4: 0.82 ng/dL (ref 0.61–1.12)

## 2023-05-27 MED ORDER — CALCIUM CHLORIDE 10 % IV SOLN
INTRAVENOUS | Status: AC
  Administered 2023-05-27: 1 g via INTRAVENOUS
  Filled 2023-05-27: qty 10

## 2023-05-27 MED ORDER — SODIUM BICARBONATE 8.4 % IV SOLN
INTRAVENOUS | Status: DC
  Filled 2023-05-27 (×3): qty 1000

## 2023-05-27 MED ORDER — ORAL CARE MOUTH RINSE
15.0000 mL | OROMUCOSAL | Status: DC
  Administered 2023-05-27 – 2023-05-28 (×13): 15 mL via OROMUCOSAL

## 2023-05-27 MED ORDER — VANCOMYCIN HCL 1500 MG/300ML IV SOLN
1500.0000 mg | Freq: Once | INTRAVENOUS | Status: AC
  Administered 2023-05-27: 1500 mg via INTRAVENOUS
  Filled 2023-05-27: qty 300

## 2023-05-27 MED ORDER — SODIUM CHLORIDE 0.9 % IV SOLN
250.0000 mL | INTRAVENOUS | Status: DC
  Administered 2023-05-27: 250 mL via INTRAVENOUS

## 2023-05-27 MED ORDER — SODIUM BICARBONATE 8.4 % IV SOLN: 50.0000 meq | Freq: Once | INTRAVENOUS | Status: AC

## 2023-05-27 MED ORDER — FENTANYL CITRATE PF 50 MCG/ML IJ SOSY: 100.0000 ug | PREFILLED_SYRINGE | Freq: Once | INTRAMUSCULAR | Status: DC

## 2023-05-27 MED ORDER — SODIUM BICARBONATE 8.4 % IV SOLN
INTRAVENOUS | Status: AC
  Administered 2023-05-27: 100 meq via INTRAVENOUS
  Filled 2023-05-27: qty 100

## 2023-05-27 MED ORDER — ATORVASTATIN CALCIUM 10 MG PO TABS
10.0000 mg | ORAL_TABLET | Freq: Every day | ORAL | Status: DC
  Administered 2023-05-28: 10 mg
  Filled 2023-05-27: qty 1

## 2023-05-27 MED ORDER — ENOXAPARIN SODIUM 30 MG/0.3ML IJ SOSY
30.0000 mg | PREFILLED_SYRINGE | Freq: Every day | INTRAMUSCULAR | Status: DC
  Administered 2023-05-27: 30 mg via SUBCUTANEOUS
  Filled 2023-05-27: qty 0.3

## 2023-05-27 MED ORDER — LEVOTHYROXINE SODIUM 112 MCG PO TABS
112.0000 ug | ORAL_TABLET | Freq: Every day | ORAL | Status: DC
  Administered 2023-05-28: 112 ug
  Filled 2023-05-27: qty 1

## 2023-05-27 MED ORDER — ACETAMINOPHEN 325 MG PO TABS
650.0000 mg | ORAL_TABLET | Freq: Four times a day (QID) | ORAL | Status: DC | PRN
  Administered 2023-05-27 – 2023-05-28 (×2): 650 mg via ORAL
  Filled 2023-05-27 (×2): qty 2

## 2023-05-27 MED ORDER — POLYETHYLENE GLYCOL 3350 17 G PO PACK: 17.0000 g | PACK | Freq: Every day | ORAL | Status: DC | PRN

## 2023-05-27 MED ORDER — ACETAMINOPHEN 650 MG RE SUPP: 650.0000 mg | Freq: Four times a day (QID) | RECTAL | Status: DC | PRN

## 2023-05-27 MED ORDER — MIDAZOLAM HCL 2 MG/2ML IJ SOLN: 2.0000 mg | Freq: Once | INTRAMUSCULAR | Status: DC

## 2023-05-27 MED ORDER — SODIUM BICARBONATE 8.4 % IV SOLN
INTRAVENOUS | Status: AC
  Filled 2023-05-27: qty 50

## 2023-05-27 MED ORDER — LACTATED RINGERS IV BOLUS
1000.0000 mL | Freq: Once | INTRAVENOUS | Status: AC
  Administered 2023-05-27: 1000 mL via INTRAVENOUS

## 2023-05-27 MED ORDER — ROCURONIUM BROMIDE 10 MG/ML (PF) SYRINGE
PREFILLED_SYRINGE | INTRAVENOUS | Status: AC
  Filled 2023-05-27: qty 10

## 2023-05-27 MED ORDER — FAMOTIDINE 20 MG PO TABS: 20.0000 mg | ORAL_TABLET | Freq: Every day | ORAL | Status: DC

## 2023-05-27 MED ORDER — DOCUSATE SODIUM 100 MG PO CAPS: 100.0000 mg | ORAL_CAPSULE | Freq: Two times a day (BID) | ORAL | Status: DC | PRN

## 2023-05-27 MED ORDER — FENTANYL CITRATE PF 50 MCG/ML IJ SOSY: 25.0000 ug | PREFILLED_SYRINGE | INTRAMUSCULAR | Status: DC | PRN

## 2023-05-27 MED ORDER — MIDAZOLAM HCL 2 MG/2ML IJ SOLN
INTRAMUSCULAR | Status: AC
  Administered 2023-05-27: 2 mg
  Filled 2023-05-27: qty 2

## 2023-05-27 MED ORDER — FENTANYL CITRATE PF 50 MCG/ML IJ SOSY
PREFILLED_SYRINGE | INTRAMUSCULAR | Status: AC
  Administered 2023-05-27: 50 ug
  Filled 2023-05-27: qty 2

## 2023-05-27 MED ORDER — CALCIUM CHLORIDE 10 % IV SOLN: 1.0000 g | Freq: Once | INTRAVENOUS | Status: AC

## 2023-05-27 MED ORDER — ORAL CARE MOUTH RINSE: 15.0000 mL | OROMUCOSAL | Status: DC | PRN

## 2023-05-27 MED ORDER — POTASSIUM CHLORIDE 20 MEQ PO PACK
60.0000 meq | PACK | Freq: Once | ORAL | Status: AC
  Administered 2023-05-27: 60 meq
  Filled 2023-05-27: qty 3

## 2023-05-27 MED ORDER — ENOXAPARIN SODIUM 40 MG/0.4ML IJ SOSY: 40.0000 mg | PREFILLED_SYRINGE | Freq: Every day | INTRAMUSCULAR | Status: DC

## 2023-05-27 MED ORDER — ONDANSETRON HCL 4 MG/2ML IJ SOLN: 4.0000 mg | Freq: Four times a day (QID) | INTRAMUSCULAR | Status: DC | PRN

## 2023-05-27 MED ORDER — NOREPINEPHRINE 4 MG/250ML-% IV SOLN
INTRAVENOUS | Status: AC
  Administered 2023-05-27: 6 mg via INTRAVENOUS
  Filled 2023-05-27: qty 250

## 2023-05-27 MED ORDER — ALBUMIN HUMAN 5 % IV SOLN
12.5000 g | Freq: Once | INTRAVENOUS | Status: AC
  Administered 2023-05-27: 12.5 g via INTRAVENOUS
  Filled 2023-05-27: qty 250

## 2023-05-27 MED ORDER — SODIUM BICARBONATE 8.4 % IV SOLN
50.0000 meq | Freq: Once | INTRAVENOUS | Status: AC
  Administered 2023-05-27: 50 meq via INTRAVENOUS
  Filled 2023-05-27: qty 50

## 2023-05-27 MED ORDER — SODIUM CHLORIDE 0.9 % IV SOLN: 2.0000 g | INTRAVENOUS | Status: DC

## 2023-05-27 MED ORDER — ONDANSETRON HCL 4 MG PO TABS: 4.0000 mg | ORAL_TABLET | Freq: Four times a day (QID) | ORAL | Status: DC | PRN

## 2023-05-27 MED ORDER — VANCOMYCIN HCL IN DEXTROSE 1-5 GM/200ML-% IV SOLN: 1000.0000 mg | INTRAVENOUS | Status: DC

## 2023-05-27 MED ORDER — POLYETHYLENE GLYCOL 3350 17 G PO PACK
17.0000 g | PACK | Freq: Every day | ORAL | Status: DC
  Administered 2023-05-27: 17 g
  Filled 2023-05-27: qty 1

## 2023-05-27 MED ORDER — SODIUM CHLORIDE 0.9 % IV SOLN
2.0000 g | Freq: Once | INTRAVENOUS | Status: AC
  Administered 2023-05-27: 2 g via INTRAVENOUS
  Filled 2023-05-27: qty 12.5

## 2023-05-27 MED ORDER — ETOMIDATE 2 MG/ML IV SOLN
INTRAVENOUS | Status: AC
  Filled 2023-05-27: qty 20

## 2023-05-27 MED ORDER — DOCUSATE SODIUM 50 MG/5ML PO LIQD
100.0000 mg | Freq: Two times a day (BID) | ORAL | Status: DC
  Administered 2023-05-27 – 2023-05-28 (×2): 100 mg
  Filled 2023-05-27 (×2): qty 10

## 2023-05-27 MED ORDER — ROCURONIUM BROMIDE 10 MG/ML (PF) SYRINGE
80.0000 mg | PREFILLED_SYRINGE | Freq: Once | INTRAVENOUS | Status: AC
  Administered 2023-05-27: 80 mg via INTRAVENOUS

## 2023-05-27 MED ORDER — FENTANYL CITRATE PF 50 MCG/ML IJ SOSY
25.0000 ug | PREFILLED_SYRINGE | INTRAMUSCULAR | Status: DC | PRN
  Administered 2023-05-27: 50 ug via INTRAVENOUS
  Filled 2023-05-27: qty 1

## 2023-05-27 MED ORDER — DEXMEDETOMIDINE HCL IN NACL 400 MCG/100ML IV SOLN
0.0000 ug/kg/h | INTRAVENOUS | Status: DC
  Administered 2023-05-27: 0.4 ug/kg/h via INTRAVENOUS
  Filled 2023-05-27: qty 100

## 2023-05-27 MED ORDER — POTASSIUM CHLORIDE 10 MEQ/100ML IV SOLN
10.0000 meq | INTRAVENOUS | Status: AC
  Administered 2023-05-27 (×4): 10 meq via INTRAVENOUS
  Filled 2023-05-27 (×4): qty 100

## 2023-05-27 MED ORDER — CHLORHEXIDINE GLUCONATE CLOTH 2 % EX PADS
6.0000 | MEDICATED_PAD | Freq: Every day | CUTANEOUS | Status: DC
  Administered 2023-05-27: 6 via TOPICAL

## 2023-05-27 MED ORDER — FAMOTIDINE 20 MG PO TABS
20.0000 mg | ORAL_TABLET | Freq: Two times a day (BID) | ORAL | Status: DC
  Administered 2023-05-27: 20 mg
  Filled 2023-05-27: qty 1

## 2023-05-27 MED ORDER — NOREPINEPHRINE 4 MG/250ML-% IV SOLN
0.0000 ug/min | INTRAVENOUS | Status: DC
  Administered 2023-05-27: 40 ug/min via INTRAVENOUS
  Administered 2023-05-27: 14 ug/min via INTRAVENOUS
  Administered 2023-05-27: 40 ug/min via INTRAVENOUS
  Administered 2023-05-27: 10 ug/min via INTRAVENOUS
  Administered 2023-05-28: 30 ug/min via INTRAVENOUS
  Administered 2023-05-28: 38 ug/min via INTRAVENOUS
  Administered 2023-05-28: 36 ug/min via INTRAVENOUS
  Administered 2023-05-28: 34 ug/min via INTRAVENOUS
  Administered 2023-05-28: 30 ug/min via INTRAVENOUS
  Filled 2023-05-27 (×3): qty 500
  Filled 2023-05-27 (×3): qty 250

## 2023-05-27 MED ORDER — LACTATED RINGERS IV SOLN: INTRAVENOUS | Status: DC

## 2023-05-27 MED ORDER — LACTATED RINGERS IV SOLN
150.0000 mL/h | INTRAVENOUS | Status: DC
  Administered 2023-05-27: 150 mL/h via INTRAVENOUS

## 2023-05-27 MED ORDER — SODIUM CHLORIDE 0.9 % IV SOLN
1.0000 g | INTRAVENOUS | Status: DC
  Administered 2023-05-28: 1 g via INTRAVENOUS
  Filled 2023-05-27: qty 10

## 2023-05-27 MED ORDER — ETOMIDATE 2 MG/ML IV SOLN
10.0000 mg | Freq: Once | INTRAVENOUS | Status: AC
  Administered 2023-05-27: 10 mg via INTRAVENOUS

## 2023-05-27 NOTE — Progress Notes (Addendum)
eLink Physician-Brief Progress Note Patient Name: Emily Castaneda DOB: [birth year] MRN: [id number]   Date of Service  05/27/2023  HPI/Events of Note  88 year old woman who presented with altered mental status and fever, tachycardia.  Lactic acidosis present.  Urinalysis consistent with UTI. Intubated for airway protection, fluid resuscitated started on vasopressors and antibiotics.  Notified by team that the patient was severely hypotensive into maps of 30s-40s.  Patient is on peripheral norepinephrine and on spontaneous breathing trial.   eICU Interventions   Switch back to a rate, increase respiratory rate to 30.  Given the degree of hypotension, I discussed pursuing central line with the patient's family members.  They are opposed to pursuing any sort of intervention that may cause pain or discomfort and do not wish to escalate her care any further.  Talked about maintaining current level of care until family can be at bedside-her husband lives in an assisted living locally and would like to be present.  Maintain no escalation of care.  2 ampoules of bicarb administered.  Glucose within normal limits.  1 g of calcium administered.  Anticipate transition to comfort measures upon arrival of the remaining family.   2147 -spoke to family at bedside.  No anticipation of escalating further.  Maintaining norepinephrine at 40 mcg (maximum dosing).  No ABG or escalation of care at this time.  Discussed with the family that patient may pass away regardless of current interventions-they understand and requested no further interventions at this time.  Intervention Category Intermediate Interventions: Hypotension - evaluation and management  Sherlock Nancarrow 05/27/2023, 7:44 PM

## 2023-05-27 NOTE — ED Notes (Addendum)
Respiratory called for ABG

## 2023-05-27 NOTE — Consult Note (Signed)
NAME:  Emily Castaneda, MRN:  [id number], DOB:  06/11/1935, LOS: 1 ADMISSION DATE:  06/19/2023, CONSULTATION DATE:  10/7 REFERRING MD:  Dr. Mikeal Hawthorne, CHIEF COMPLAINT:  urosepsis   History of Present Illness:  Patient is a 88 yo F w/ pertinent PMH dementia, HLD, hypothyroidism, OSA on CPAP, depression presents to [facility] ED on 2023-06-19 w/ urosepsis.  On 06-19-23 patient having slow speech and tremors. Noted to be altered. EMS called and transported to [facility]. Febrile 101 F and tachy 110s. LA 4.3. UA w/ leukocytosis and bacteria. CXR unremarkable. Sepsis protocol initiated: cultures obtained, iv fluids, and started on rocephin. TRH to admit. Throughout the night patient RR in 20-30s and now tachycardic 140s. BP starting to become soft. LA 6.6 from 3.7. Patient more altered. PCCM consulted.  Pertinent  Medical History   Past Medical History:  Diagnosis Date   Depression    Dr. Raquel James   Diverticulosis of colon    Endometriosis    Hx of colonic polyps    Hyperlipidemia    Hypothyroidism    Manic, depressive (HCC)    Menopausal syndrome    OSA (obstructive sleep apnea)    cpap; 17 cm (clint young)     Significant Hospital Events: Including procedures, antibiotic start and stop dates in addition to other pertinent events   19-Jun-2023 admitted w/ urosepsis 10/7 worsening LA and soft bp  Interim History / Subjective:  Patient on NRB sats low 90s Gurgly respirations; weak cough Patient lethargic; opens eyes to voice and follows some commands; able to state name  Objective   Blood pressure (!) 130/46, pulse 99, temperature 99.8 F (37.7 C), temperature source Rectal, resp. rate (!) 29, SpO2 98%.        Intake/Output Summary (Last 24 hours) at 05/27/2023 0346 Last data filed at 06/19/23 1738 Gross per 24 hour  Intake 100 ml  Output --  Net 100 ml   There were no vitals filed for this visit.  Examination: General:  elderly ill appearing female HEENT: MM pink/dry Neuro: lethargic; opens eyes to  voice and follows some commands; able to state name CV: s1s2, tachy 130s, no m/r/g PULM:  dim rhonchi BS bilaterally; on NRB sats 92% GI: soft, bsx4 active  Extremities: warm/dry, ble edema; chronic venous stasis  Skin: no rashes or lesions    Resolved Hospital Problem list     Assessment & Plan:  Urosepsis Plan: -cont iv fluids -trend LA -check pct -broaden abx  -follow cultures -trend wbc/fever curve  Acute metabolic encephalopathy Hx of dementia -likley in setting of sepsis Plan: -ct head -treat sepsis as above -f/u vbg -check ammonia and tsh -resume home dementia meds when able  Acute respiratory failure w/ hypoxia -possible aspiration Hx of osa on cpap Plan: -will intubate -LTVV strategy with tidal volumes of 6-8 cc/kg ideal body weight -check ABG and adjust settings accordingly  -Wean PEEP/FiO2 for SpO2 >92% -VAP bundle in place -Daily SAT and SBT -PAD protocol in place -wean sedation for RASS goal 0 to -1 -trach aspirate  AKI Plan: -iv fluids -Trend BMP / urinary output -Replace electrolytes as indicated -Avoid nephrotoxic agents, ensure adequate renal perfusion  Hypothyroidism Plan: -tsh -synthroid  Bipolar disorder Plan: -hold home meds for now  HLD  Plan: -statin   Best Practice (right click and "Reselect all SmartList Selections" daily)   Diet/type: NPO DVT prophylaxis: LMWH GI prophylaxis: N/A Lines: N/A Foley:  N/A Code Status:  full code Last date of multidisciplinary goals of  care discussion [attempted to contact family over phone but no answer]  Labs   CBC: Recent Labs  Lab May 31, 2023 1635 05/27/23 0235  WBC 9.3 2.0*  NEUTROABS 8.6*  --   HGB 13.1 13.4  HCT 40.4 40.8  MCV 97.1 97.4  PLT 194 156    Basic Metabolic Panel: Recent Labs  Lab May 31, 2023 1635 05/27/23 0235  NA 143 141  K 3.5 3.9  CL 104 108  CO2 22 17*  GLUCOSE 133* 117*  BUN 22 20  CREATININE 1.21* 1.40*  CALCIUM 8.9 8.5*   GFR: CrCl cannot  be calculated (Unknown ideal weight.). Recent Labs  Lab 05/31/2023 1635 05/31/2023 1708 31-May-2023 1919 05/27/23 0235 05/27/23 0241  WBC 9.3  --   --  2.0*  --   LATICACIDVEN  --  4.3* 3.7*  --  6.6*    Liver Function Tests: Recent Labs  Lab 05-31-23 1635 05/27/23 0235  AST 28 34  ALT 17 17  ALKPHOS 62 71  BILITOT 0.3 0.5  PROT 6.1* 6.0*  ALBUMIN 2.8* 2.4*   No results for input(s): "LIPASE", "AMYLASE" in the last 168 hours. No results for input(s): "AMMONIA" in the last 168 hours.  ABG    Component Value Date/Time   PHART 7.370 11/14/2013 0357   PCO2ART 42.1 11/14/2013 0357   PO2ART 64.6 (L) 11/14/2013 0357   HCO3 23.8 11/14/2013 0357   TCO2 21.7 11/14/2013 0357   ACIDBASEDEF 0.9 11/14/2013 0357   O2SAT 92.6 11/14/2013 0357     Coagulation Profile: Recent Labs  Lab 2023-05-31 1635 05/27/23 0235  INR 1.1 1.2    Cardiac Enzymes: No results for input(s): "CKTOTAL", "CKMB", "CKMBINDEX", "TROPONINI" in the last 168 hours.  HbA1C: No results found for: "HGBA1C"  CBG: Recent Labs  Lab 05/27/23 0341  GLUCAP 118*    Review of Systems:   Patient is encephalopathic; therefore, history has been obtained from chart review.    Past Medical History:  She,  has a past medical history of Depression, Diverticulosis of colon, Endometriosis, colonic polyps, Hyperlipidemia, Hypothyroidism, Manic, depressive (HCC), Menopausal syndrome, and OSA (obstructive sleep apnea).   Surgical History:   Past Surgical History:  Procedure Laterality Date   APPENDECTOMY     COLONOSCOPY     POLYPECTOMY     Dr. Nicholas Lose   TONSILLECTOMY AND ADENOIDECTOMY       Social History:   reports that she quit smoking about 39 years ago. Her smoking use included cigarettes. She started smoking about 49 years ago. She has a 1 pack-year smoking history. She has never used smokeless tobacco. She reports current alcohol use of about 1.0 - 2.0 standard drink of alcohol per week. She reports that she  does not use drugs.   Family History:  Her family history includes Breast cancer in her daughter and maternal aunt; Cancer in her maternal grandfather and another family member; Diabetes in her brother and mother; Heart disease in her father and another family member. There is no history of Colon cancer, Rectal cancer, or Stomach cancer.   Allergies No Known Allergies   Home Medications  Prior to Admission medications   Medication Sig Start Date End Date Taking? Authorizing Provider  acetaminophen (TYLENOL) 500 MG tablet Take 1,000 mg by mouth 3 (three) times daily as needed for moderate pain.   Yes [provider]  atorvastatin (LIPITOR) 10 MG tablet Take 1 tablet (10 mg total) by mouth daily. Patient taking differently: Take 10 mg by mouth at bedtime.  04/29/20 06/12/23 Yes Cirigliano, Jearld Lesch, DO  Cholecalciferol (VITAMIN D3) 2000 units TABS Take 1 tablet by mouth daily.   Yes [provider]  Cyanocobalamin (B-12) 1000 MCG CAPS Take 1,000 mcg by mouth daily.   Yes [provider]  divalproex (DEPAKOTE ER) 250 MG 24 hr tablet Take 1,250 mg by mouth at bedtime.   Yes [provider]  fluticasone (FLONASE) 50 MCG/ACT nasal spray Place 1 spray into both nostrils daily.   Yes [provider]  levothyroxine (SYNTHROID) 112 MCG tablet Take 1 tablet (112 mcg total) by mouth daily before breakfast. 04/29/20  Yes Cirigliano, Mary K, DO  lidocaine (LMX) 4 % cream Apply 1 Application topically daily. Apply pea-sized amount mixed with aquaphor ointment to bilateral lower extremities.   Yes [provider]  lidocaine 4 % Place 1 patch onto the skin See admin instructions. Apply 1 patch a day to area specified by resident, leave on for 12 hours, remove at bedtime.   Yes [provider]  loperamide HCl (IMODIUM) 1 MG/7.5ML suspension Take 2-4 mg by mouth See admin instructions. Give 30mL by mouth with first loose stool, then give 15mL by mouth per  subsequent loose stool. Not to exceed 87mL/24 hours.   Yes [provider]  mineral oil-hydrophilic petrolatum (AQUAPHOR) ointment Apply 1 Application topically daily. Apply moderate amount mixed with lidocaine cream to bilateral lower extremities.   Yes [provider]  Multiple Vitamins-Minerals (PRESERVISION AREDS 2) CAPS Take 1 capsule by mouth 2 (two) times daily.   Yes [provider]  Polyethylene Glycol 3350 POWD Take 17 g by mouth daily. Hold for loose stool.   Yes [provider]  polyvinyl alcohol (LIQUIFILM TEARS) 1.4 % ophthalmic solution Place 1 drop into both eyes in the morning and at bedtime.   Yes [provider]  Probiotic Product (ALIGN) 4 MG CAPS Take 1 capsule (4 mg total) by mouth daily. 08/25/20  Yes Cirigliano, Mary K, DO  psyllium (REGULOID) 0.52 g capsule Take 0.52 g by mouth in the morning and at bedtime.   Yes [provider]  rivastigmine (EXELON) 13.3 MG/24HR Place 13.3 mg onto the skin daily. 05/08/23  Yes [provider]  shark liver oil-cocoa butter (PREPARATION H) 0.25-88.44 % suppository Place 1 suppository rectally daily as needed for hemorrhoids.   Yes [provider]     Critical care time: 45 minutes    JD Anselm Lis [facility] 05/27/2023, 3:46 AM  Please see [email or web address] for pager details.  From 7A-7P if no response, please call [phone]. After hours, please call ELink [phone].

## 2023-05-27 NOTE — ED Notes (Addendum)
Notified Dr. Arlean Hopping of patient's Critical Lactic 6.6, Dr. Arlean Hopping to enter new orders

## 2023-05-27 NOTE — ED Notes (Signed)
Notified Dr. Mikeal Hawthorne that the patient is more labored in her breathing, tachypenic  and HR 125, she has received of fluids since she has been here, turned her IV fluids off for now, bladder scanned 0ml in bladder, she has only voided 1time since 1900 and it was a small amount. Requested for Dr. Mikeal Hawthorne to come and assess this patient.

## 2023-05-27 NOTE — Progress Notes (Signed)
Potassium given per OGT. Patient immediately vomited dose. Celine Mans PA notified.

## 2023-05-27 NOTE — Progress Notes (Addendum)
Agarwala MD notified of increasing pressor requirements above max for peripheral levophed. Order received for bottle of albumin IV and to keep levophed at max of 10.

## 2023-05-27 NOTE — Progress Notes (Signed)
TRH night cross cover note:   I was contacted by patient's RN regarding the patient's increased work of breathing, tachycardia, increased lactic acid, now 6.6.  I evaluated the patient at bedside, she denies any acute complaints, including no chest pain demonstrates increased work of breathing. STAT CXR, EKG  ordered.   She is here with severe sepsis due to urinary tract infection has been receiving Rocephin.  Has received approximately 3800 cc of total IV fluid.  Repeat lactate is pending.  Have added on a procalcitonin level and BNP as well.   In the setting of progressive tachycardia, tachypnea, with respiratory rate in the 30s to 40s, increasing lactate now 6.6, with blood pressure trending down, now 90s/50s mmHg, I have contacted on-call PCCM, Dr. Isaiah Serge, who will evaluate the patient.    Newton Pigg, DO Hospitalist

## 2023-05-27 NOTE — ED Notes (Addendum)
Called Dr. Arlean Hopping that the patient has had EKG changes and requesting him to review results

## 2023-05-27 NOTE — Progress Notes (Signed)
Pharmacy Antibiotic Note  Emily Castaneda is a 88 y.o. female admitted on 05/25/2023 with sepsis and UTI.  Pharmacy has been consulted for cefepime and vancomycin dosing.  -WBC= 2, tmax= 101, :LA= 8.7 -SCr= 1.4 (BL ~ 0.8)  Plan: -Cefepime 2gm IV q24h -Vancomycin 1000mg  IV q48hr  Height: 5\' 1"  (154.9 cm) Weight: 95 kg (209 lb 7 oz) IBW/kg (Calculated) : 47.8  Temp (24hrs), Avg:99.1 F (37.3 C), Min:97.6 F (36.4 C), Max:101 F (38.3 C)  Recent Labs  Lab 05/25/2023 1635 05/24/2023 1708 06/11/2023 1919 05/27/23 0235 05/27/23 0241 05/27/23 0447  WBC 9.3  --   --  2.0*  --   --   CREATININE 1.21*  --   --  1.40*  --   --   LATICACIDVEN  --  4.3* 3.7*  --  6.6* 8.7*    Estimated Creatinine Clearance: 30.4 mL/min (A) (by C-G formula based on SCr of 1.4 mg/dL (H)).    No Known Allergies  Antimicrobials this admission: 10/7 cefepime 10/7 vanc  Dose adjustments this admission:   Microbiology results: 10/7 MRSA PCR- neg 10/6 blood x1 10.6 urine  Thank you for allowing pharmacy to be a part of this patient's care.  Harland German, PharmD Clinical Pharmacist **Pharmacist phone directory can now be found on [email or web address] ([email or web address]).  Listed under [facility].

## 2023-05-27 NOTE — Progress Notes (Signed)
Pharmacy Antibiotic Note  Emily Castaneda is a 88 y.o. female admitted on 06/23/2023 with sepsis and UTI.  Pharmacy has been consulted for Vancomycin, Cefepime dosing. Leukopenic. Scr 1.4. Needs updated weight  Plan: Vancomycin 1500 mg IV x 1 Cefepime 2g IV x 1 F/U updated weight for additional dosing  Height: 5\' 1"  (154.9 cm) IBW/kg (Calculated) : 47.8  Temp (24hrs), Avg:99.3 F (37.4 C), Min:97.6 F (36.4 C), Max:101 F (38.3 C)  Recent Labs  Lab 06-23-23 1635 June 23, 2023 1708 June 23, 2023 1919 05/27/23 0235 05/27/23 0241 05/27/23 0447  WBC 9.3  --   --  2.0*  --   --   CREATININE 1.21*  --   --  1.40*  --   --   LATICACIDVEN  --  4.3* 3.7*  --  6.6* 8.7*    CrCl cannot be calculated (Unknown ideal weight.).    No Known Allergies  Abran Duke, PharmD, BCPS Clinical Pharmacist Phone: [phone]

## 2023-05-27 NOTE — ED Notes (Signed)
Time out noted for ETT placement with Pia Mau, PA

## 2023-05-27 NOTE — ED Notes (Addendum)
[facility] provider paged to report patient change in condition, received call back at [phone]

## 2023-05-27 NOTE — ED Notes (Signed)
RT called to assist with transporting patient to the floor for Vent support/transport

## 2023-05-27 NOTE — Procedures (Signed)
Intubation Procedure Note  Emily Castaneda  [id number]  1935/07/22  Date:05/27/23  Time:5:10 AM   Provider Performing:Kinlee Garrison D Suzie Portela    Procedure: Intubation (31500)  Indication(s) Respiratory Failure  Consent Unable to obtain consent due to emergent nature of procedure. Attempted to reach family by phone but no answer.  Anesthesia Etomidate, Versed, Fentanyl, and Rocuronium   Time Out Verified patient identification, verified procedure, site/side was marked, verified correct patient position, special equipment/implants available, medications/allergies/relevant history reviewed, required imaging and test results available.   Sterile Technique Usual hand hygeine, masks, and gloves were used   Procedure Description Patient positioned in bed supine.  Sedation given as noted above.  Patient was intubated with endotracheal tube using Glidescope.  View was Grade 1 full glottis .  Number of attempts was 1.  Colorimetric CO2 detector was consistent with tracheal placement.   Complications/Tolerance None; patient tolerated the procedure well. Chest X-ray is ordered to verify placement.   EBL Minimal   Specimen(s) None  JD Anselm Lis [place] [facility] 05/27/2023, 5:10 AM  Please see [email or web address] for pager details.  From 7A-7P if no response, please call [phone]. After hours, please call ELink [phone].

## 2023-05-27 NOTE — Progress Notes (Signed)
Pt has multiple IVs. No other access needed at this time per primary RN. IV Team will remain available as needed.

## 2023-05-27 NOTE — Progress Notes (Signed)
Attending note: I have seen and examined the patient. History, labs and imaging reviewed.  88 year old with history of dementia, hyperlipidemia, hypothyroidism, OSA presenting with urosepsis.  Treated with IV Fluids, ceftriaxone. PCCM consulted for tachycardia, desaturations, hypotension and increasing lactic acid.  Blood pressure (!) 130/46, pulse 99, temperature 99.8 F (37.7 C), temperature source Rectal, resp. rate (!) 29, SpO2 98%. Gen:      Moderate distress with increased work of breathing HEENT:  EOMI, sclera anicteric Neck:     No masses; no thyromegaly Lungs:    Scattered rhonchi CV:         Regular rate and rhythm; no murmurs Abd:      + bowel sounds; soft, non-tender; no palpable masses, no distension Ext:    No edema; adequate peripheral perfusion Skin:      Warm and dry; no rash Neuro: Somnolent, arousable  Labs/Imaging personally reviewed, significant for BUN/creatinine 20/1.40, AST 34, ALT 17 WBC 2.0, hemoglobin 13.4, platelets 156 Lactic acid 6.6  Assessment/plan: Severe sepsis secondary to UTI Continue IV fluids, follow lactic acid, cultures Change ceftriaxone to vancomycin, cefepime  Acute metabolic/encephalopathy secondary to sepsis History of dementia Follow CT head Check TSH, ammonia  Acute respiratory failure Follow repeat chest x-ray Intubated for respiratory protection  The patient is critically ill with multiple organ systems failure and requires high complexity decision making for assessment and support, frequent evaluation and titration of therapies, application of advanced monitoring technologies and extensive interpretation of multiple databases.  Critical care time - 35 mins. This represents my time independent of the NPs time taking care of the pt.  Chilton Greathouse MD [place] [facility] 05/27/2023, 3:57 AM

## 2023-05-27 NOTE — Progress Notes (Addendum)
NAME:  Emily Castaneda, MRN:  [id number], DOB:  [birth year], LOS: 1 ADMISSION DATE:  2023-06-10, CONSULTATION DATE:  10/7 REFERRING MD:  Dr. Mikeal Hawthorne, CHIEF COMPLAINT:  urosepsis   History of Present Illness:  Patient is a 88 yo F w/ pertinent PMH dementia, HLD, hypothyroidism, OSA on CPAP, depression presents to [facility] ED on 06-10-23 w/ urosepsis.  On 06-10-23 patient having slow speech and tremors. Noted to be altered. EMS called and transported to [facility]. Febrile 101 F and tachy 110s. LA 4.3. UA w/ leukocytosis and bacteria. CXR unremarkable. Sepsis protocol initiated: cultures obtained, iv fluids, and started on rocephin. TRH to admit. Throughout the night patient RR in 20-30s and now tachycardic 140s. BP starting to become soft. LA 6.6 from 3.7. Patient more altered. PCCM consulted.  Pertinent  Medical History   Past Medical History:  Diagnosis Date   Depression    Dr. Raquel James   Diverticulosis of colon    Endometriosis    Hx of colonic polyps    Hyperlipidemia    Hypothyroidism    Manic, depressive (HCC)    Menopausal syndrome    OSA (obstructive sleep apnea)    cpap; 17 cm (clint young)     Significant Hospital Events: Including procedures, antibiotic start and stop dates in addition to other pertinent events   06/10/2023 admitted w/ urosepsis 10/7 worsening LA and soft bp. Intubated early AM for AMS  Interim History / Subjective:  Stable on vent. She is awake, follows commands. Attempted SBT but about 15 minutes in, had a vomiting episode and became tachypneic afterwards so placed back to full support. Had DNR form in paperwork from ALF. Called daughter Kathie Dike who confirmed this; however, shortly after, she called unit back and informed RN that pt's husband wanted to rescind this and list her as full code. Other daughter, Bufford Lope will be arriving to hospital shortly so we will clarify things once she arrives.  Objective   Blood pressure (!) 109/42, pulse (!) 101, temperature 97.8  F (36.6 C), temperature source Oral, resp. rate (!) 26, height 5\' 1"  (1.549 m), weight 95 kg, SpO2 97%.    Vent Mode: AC FiO2 (%):  [50 %-100 %] 50 % Set Rate:  [16 bmp-24 bmp] 24 bmp Vt Set:  [400 mL] 400 mL PEEP:  [5 cmH20] 5 cmH20 Plateau Pressure:  [18 cmH20-19 cmH20] 19 cmH20   Intake/Output Summary (Last 24 hours) at 05/27/2023 1036 Last data filed at 05/27/2023 1000 Gross per 24 hour  Intake 2395.81 ml  Output --  Net 2395.81 ml   Filed Weights   05/27/23 0702  Weight: 95 kg    Examination: General:  elderly ill appearing female HEENT: MM pink/dry Neuro: Awake on vent, follows basic commands CV: RRR, no M/R/G PULM:  Normal effort. Clear bilaterally GI: soft, bsx4 active  Extremities: warm/dry, ble edema; chronic venous stasis changes BLE Skin: no rashes or lesions   Assessment & Plan:   Urosepsis with septic shock Plan: -cont Cefepime, d/c Vanc with neg MRSA PCR -follow cultures -Continue Levophed, wean as able -trend wbc/fever curve  Acute metabolic encephalopathy - presumed 2/2 sepsis. CT head neg. Hx of dementia Plan: -Continue supportive care -Avoid sedating meds -resume home dementia meds when able  Acute respiratory failure w/ hypoxia - s/p intubation for AMS 10/7 Hx of osa on cpap Plan: -Continue vent support -Weaned to PSV this AM and was doing well until had a vomiting episode with potassium repletion, then became tachypneic  and placed back to full support -repeat PSV this PM -Bronchial hygiene -CXR intermittently  AKI - oliguric. Minimal UOP on in/out cath Hypokalemia Lactic acidosis Plan: -Gentle hydration -get renal US -Replete K, change to IV as she vomited with PO -Repeat BMP and lactate  Hypothyroidism Plan: -Continue synthroid -Add T3, T4  Bipolar disorder Plan: -hold home meds for now  HLD  Plan: -statin  Goals of care -DNR form in paperwork from ALF. Confirmed with daughter Kathie Dike over the phone; however,  shortly after, she called unit back and informed RN that pt's husband wanted to rescind this and list her as full code. Other daughter, Bufford Lope will be arriving to hospital shortly so we will clarify things once she arrives.  Best Practice (right click and "Reselect all SmartList Selections" daily)   Diet/type: NPO DVT prophylaxis: LMWH GI prophylaxis: N/A Lines: N/A Foley:  N/A Code Status:  DNR - need to clarify with daughter after she arrives shortly Last date of multidisciplinary goals of care discussion [attempted to contact family over phone but no answer]  Critical care time: 40 minutes    Rutherford Guys, [place] - C [place] [facility] For pager details, please see AMION or use Epic chat  After 1900, please call ELINK for cross coverage needs 05/27/2023, 10:52 AM

## 2023-05-27 NOTE — ED Notes (Addendum)
Notified Dr. Newton Pigg the patient is more labored in her breathing, tachypenic  and HR 125, she has received of fluids since ER arrival. I have turned her IV fluids off for now, bladder scanned 0ml in bladder, she has only voided 1 time since 1900 and it was a small amount. Dr, Arlean Hopping to come and assess the patient.

## 2023-05-27 NOTE — ED Notes (Signed)
Patient monitor showing tachycardia. EKG obtained and alerted RN.

## 2023-05-27 NOTE — Progress Notes (Signed)
PCCM Brief Note  Additional daughter, Bufford Lope arrived at bedside. I met with her at the bedside with pt's RN, Angela Burke present for the conversation. We discussed pt's DNR form and that husband had mentioned rescinding this. Maralyn Sago states her mom and dad have always stated they would not want to undergo resuscitation in the event of an arrest and agrees perhaps her dad was confused regarding the conversation on code status. She confirms that pt is in fact DNR. She has requested we maintain DNR orders in the chart but has also stated that she will call her two sisters and discuss this again. She will notify us if there are any changes/new decisions.  For now, DNR maintained.   Rutherford Guys, PA - C [place] [facility] For pager details, please see AMION or use Epic chat  After 1900, please call [facility] for cross coverage needs 05/27/2023, 1:19 PM

## 2023-05-27 NOTE — ED Notes (Signed)
Patient transported to the floor with portable cardiac monitor in use via stretcher, with RT for ventilator support/transport

## 2023-05-27 NOTE — Progress Notes (Signed)
I responded to a page from the nurse to provide spiritual support for the patient's family. I arrived at the patient's room where her husband and two daughters were present. I provided spiritual support through pastoral presence, sharing words of encouragement and leading in prayer.    05/27/23 2206  Spiritual Encounters  Type of Visit Initial  Care provided to: Pt and family  Conversation partners present during encounter Nurse  Referral source Nurse (RN/NT/LPN)  Reason for visit Urgent spiritual support  OnCall Visit Yes  Interventions  Spiritual Care Interventions Made Compassionate presence;Prayer;Encouragement    Chaplain Dr Melvyn Novas

## 2023-05-28 ENCOUNTER — Inpatient Hospital Stay (HOSPITAL_COMMUNITY): Payer: Medicare Other

## 2023-05-28 DIAGNOSIS — N390 Urinary tract infection, site not specified: Secondary | ICD-10-CM | POA: Diagnosis not present

## 2023-05-28 DIAGNOSIS — N179 Acute kidney failure, unspecified: Secondary | ICD-10-CM | POA: Diagnosis not present

## 2023-05-28 DIAGNOSIS — E872 Acidosis, unspecified: Secondary | ICD-10-CM | POA: Diagnosis not present

## 2023-05-28 DIAGNOSIS — R34 Anuria and oliguria: Secondary | ICD-10-CM

## 2023-05-28 DIAGNOSIS — A419 Sepsis, unspecified organism: Secondary | ICD-10-CM | POA: Diagnosis not present

## 2023-05-28 DIAGNOSIS — G9341 Metabolic encephalopathy: Secondary | ICD-10-CM | POA: Diagnosis not present

## 2023-05-28 DIAGNOSIS — Z66 Do not resuscitate: Secondary | ICD-10-CM

## 2023-05-28 LAB — PHOSPHORUS: Phosphorus: 2.2 mg/dL — ABNORMAL LOW (ref 2.5–4.6)

## 2023-05-28 LAB — CBC
HCT: 35.2 % — ABNORMAL LOW (ref 36.0–46.0)
Hemoglobin: 11.6 g/dL — ABNORMAL LOW (ref 12.0–15.0)
MCH: 31.7 pg (ref 26.0–34.0)
MCHC: 33 g/dL (ref 30.0–36.0)
MCV: 96.2 fL (ref 80.0–100.0)
Platelets: UNDETERMINED 10*3/uL (ref 150–400)
RBC: 3.66 MIL/uL — ABNORMAL LOW (ref 3.87–5.11)
RDW: 14.6 % (ref 11.5–15.5)
WBC: 27.6 10*3/uL — ABNORMAL HIGH (ref 4.0–10.5)
nRBC: 0.1 % (ref 0.0–0.2)

## 2023-05-28 LAB — T3, FREE: T3, Free: 1.1 pg/mL — ABNORMAL LOW (ref 2.0–4.4)

## 2023-05-28 LAB — BASIC METABOLIC PANEL
Anion gap: 19 — ABNORMAL HIGH (ref 5–15)
BUN: 29 mg/dL — ABNORMAL HIGH (ref 8–23)
CO2: 18 mmol/L — ABNORMAL LOW (ref 22–32)
Calcium: 8.4 mg/dL — ABNORMAL LOW (ref 8.9–10.3)
Chloride: 100 mmol/L (ref 98–111)
Creatinine, Ser: 3.06 mg/dL — ABNORMAL HIGH (ref 0.44–1.00)
GFR, Estimated: 14 mL/min — ABNORMAL LOW (ref >60)
Glucose, Bld: 218 mg/dL — ABNORMAL HIGH (ref 70–99)
Potassium: 4 mmol/L (ref 3.5–5.1)
Sodium: 137 mmol/L (ref 135–145)

## 2023-05-28 LAB — GLUCOSE, CAPILLARY
Glucose-Capillary: 169 mg/dL — ABNORMAL HIGH (ref 70–99)
Glucose-Capillary: 192 mg/dL — ABNORMAL HIGH (ref 70–99)

## 2023-05-28 LAB — MAGNESIUM: Magnesium: 1 mg/dL — ABNORMAL LOW (ref 1.7–2.4)

## 2023-05-28 MED ORDER — ACETAMINOPHEN 325 MG PO TABS: 650.0000 mg | ORAL_TABLET | Freq: Four times a day (QID) | ORAL | Status: DC | PRN

## 2023-05-28 MED ORDER — GLYCOPYRROLATE 1 MG PO TABS
1.0000 mg | ORAL_TABLET | ORAL | Status: DC | PRN
  Filled 2023-05-28: qty 1

## 2023-05-28 MED ORDER — MAGNESIUM SULFATE 50 % IJ SOLN: 6.0000 g | Freq: Once | INTRAVENOUS | Status: DC

## 2023-05-28 MED ORDER — MAGNESIUM SULFATE 2 GM/50ML IV SOLN
2.0000 g | Freq: Once | INTRAVENOUS | Status: AC
  Administered 2023-05-28: 2 g via INTRAVENOUS
  Filled 2023-05-28: qty 50

## 2023-05-28 MED ORDER — SODIUM PHOSPHATES 45 MMOLE/15ML IV SOLN
15.0000 mmol | Freq: Once | INTRAVENOUS | Status: DC
  Administered 2023-05-28: 15 mmol via INTRAVENOUS
  Filled 2023-05-28: qty 5

## 2023-05-28 MED ORDER — MAGNESIUM SULFATE 4 GM/100ML IV SOLN
4.0000 g | Freq: Once | INTRAVENOUS | Status: AC
  Administered 2023-05-28: 4 g via INTRAVENOUS
  Filled 2023-05-28: qty 100

## 2023-05-28 MED ORDER — POLYVINYL ALCOHOL 1.4 % OP SOLN: 1.0000 [drp] | Freq: Four times a day (QID) | OPHTHALMIC | Status: DC | PRN

## 2023-05-28 MED ORDER — MORPHINE SULFATE (PF) 2 MG/ML IV SOLN
2.0000 mg | INTRAVENOUS | Status: DC | PRN
  Administered 2023-05-28 (×2): 4 mg via INTRAVENOUS
  Filled 2023-05-28 (×2): qty 2

## 2023-05-28 MED ORDER — INSULIN ASPART 100 UNIT/ML IJ SOLN: 0.0000 [IU] | INTRAMUSCULAR | Status: DC

## 2023-05-28 MED ORDER — MORPHINE 100MG IN NS 100ML (1MG/ML) PREMIX INFUSION
5.0000 mg/h | INTRAVENOUS | Status: DC
  Administered 2023-05-28: 5 mg/h via INTRAVENOUS
  Filled 2023-05-28 (×2): qty 100

## 2023-05-28 MED ORDER — GLYCOPYRROLATE 0.2 MG/ML IJ SOLN: 0.2000 mg | INTRAMUSCULAR | Status: DC | PRN

## 2023-05-28 MED ORDER — ACETAMINOPHEN 650 MG RE SUPP: 650.0000 mg | Freq: Four times a day (QID) | RECTAL | Status: DC | PRN

## 2023-05-28 MED ORDER — GLYCOPYRROLATE 0.2 MG/ML IJ SOLN
0.2000 mg | INTRAMUSCULAR | Status: DC | PRN
  Administered 2023-05-28: 0.2 mg via INTRAVENOUS
  Filled 2023-05-28: qty 1

## 2023-05-28 NOTE — Progress Notes (Signed)
NAME:  Emily Castaneda, MRN:  [id number], DOB:  [birth year], LOS: 2 ADMISSION DATE:  06/18/2023, CONSULTATION DATE:  10/7 REFERRING MD:  Dr. Mikeal Hawthorne, CHIEF COMPLAINT:  urosepsis   History of Present Illness:  Patient is a 88 yo F w/ pertinent PMH dementia, HLD, hypothyroidism, OSA on CPAP, depression presents to [facility] ED on 10/6 w/ urosepsis.  On 10/6 patient having slow speech and tremors. Noted to be altered. EMS called and transported to [facility]. Febrile 101 F and tachy 110s. LA 4.3. UA w/ leukocytosis and bacteria. CXR unremarkable. Sepsis protocol initiated: cultures obtained, iv fluids, and started on rocephin. TRH to admit. Throughout the night patient RR in 20-30s and now tachycardic 140s. BP starting to become soft. LA 6.6 from 3.7. Patient more altered. PCCM consulted.  Pertinent  Medical History   Past Medical History:  Diagnosis Date   Depression    Dr. Raquel James   Diverticulosis of colon    Endometriosis    Hx of colonic polyps    Hyperlipidemia    Hypothyroidism    Manic, depressive (HCC)    Menopausal syndrome    OSA (obstructive sleep apnea)    cpap; 17 cm (clint young)     Significant Hospital Events: Including procedures, antibiotic start and stop dates in addition to other pertinent events   10/6 admitted w/ urosepsis 10/7 worsening LA and soft bp. Intubated early AM for AMS  Interim History / Subjective:  Worsening shock, renal failure. Now on 30 levo. Family opted against CVL overnight. They have discussed as a family this AM and have opted to transition to comfort care.  Objective   Blood pressure (!) 129/57, pulse (!) 108, temperature (!) 100.4 F (38 C), temperature source Oral, resp. rate (!) 34, height 5\' 1"  (1.549 m), weight 100.2 kg, SpO2 95%.    Vent Mode: PRVC FiO2 (%):  [40 %-100 %] 40 % Set Rate:  [24 bmp-34 bmp] 34 bmp Vt Set:  [400 mL] 400 mL PEEP:  [5 cmH20-6 cmH20] 5 cmH20 Pressure Support:  [5 cmH20] 5 cmH20 Plateau Pressure:  [20 cmH20] 20  cmH20   Intake/Output Summary (Last 24 hours) at 05/30/2023 0910 Last data filed at 06/17/2023 0800 Gross per 24 hour  Intake 5850.69 ml  Output 425 ml  Net 5425.69 ml   Filed Weights   05/27/23 0702 05/30/2023 0615  Weight: 95 kg 100.2 kg    Examination: General:  elderly ill appearing female, critically ill HEENT: MM pink/dry. ETT in place Neuro: Somnolent, intermittently opens eyes but not following most commands this AM CV: RRR, no M/R/G PULM:  Normal effort. Clear bilaterally GI: soft, bsx4 active  Extremities: warm/dry, ble edema; chronic venous stasis changes BLE Skin: no rashes or lesions   Assessment & Plan:   Urosepsis with septic shock - unfortunately progressive shock overnight with multiorgan failure Acute metabolic encephalopathy - presumed 2/2 sepsis. CT head neg. Hx of dementia Acute respiratory failure w/ hypoxia - s/p intubation for AMS 10/7 Hx of osa on cpap AKI - anuric. Renal US normal Lactic acidosis Hypothyroidism Bipolar disorder HLD   Discussion: Unfortunately Emily Castaneda has had gradual decline since admission and is now in multisystem organ failure including worsened septic shock, ongoing hypoxic respiratory failure, acute metabolic encephalopathy, anuric renal failure.  I had an extensive bedside discussion with Emily Castaneda family including two daughters and husband at bedside regarding the current circumstances, poor prognosis, and likely poor quality of life for her.  We also discussed the  patient's prior wishes under circumstances such as this and the fact that she had made it known that she would not wish for heroic aggressive measures in the event she had a critical illness.  The family has decided to offer full comfort care for her. They have been fully updated on the process and expectations and all questions have been answered.  Emotional support offered. See separate IPAL note.   Best Practice (right click and "Reselect all SmartList  Selections" daily)   Diet/type: NPO DVT prophylaxis: LMWH GI prophylaxis: N/A Lines: N/A Foley:  N/A Code Status:  DNR - need to clarify with daughter after she arrives shortly Last date of multidisciplinary goals of care discussion [attempted to contact family over phone but no answer]  Critical care time: 30 minutes    Rutherford Guys, [place] - C [facility] For pager details, please see AMION or use Epic chat  After 1900, please call [facility] for cross coverage needs 2023-06-23, 9:10 AM

## 2023-05-28 NOTE — Progress Notes (Signed)
05/22/2023 1000  Spiritual Encounters  Type of Visit Follow up  Care provided to: Pt and family  Conversation partners present during encounter Nurse  Referral source Chaplain team  Reason for visit Routine spiritual support  OnCall Visit No  Interventions  Spiritual Care Interventions Made Established relationship of care and support;Compassionate presence;Reflective listening;Narrative/life review;Encouragement   Chaplain returned to follow up with family at the recommendation of the over night on-call chaplain. Pt daughters were at bedside, Pt husband was sleeping- daughters said he was emotionally spent and needed rest. Chaplain facilitated life review with daughters who expressed the many wonderful, funny, and larger-than-life sides of their mother.  They shares funny stories, as well as stories of the deep and loving relationship their parents shared. Daughter appeared to be experiencing anticipatory grief, yet leaning on one another for support.  Both spoke of the coming days of caring for their father. Chaplain shared how the family could reach out to spiritual care throughout the day as needed.  Chaplain services remain available by Spiritual Consult or for emergent cases, paging [phone]  Chaplain Raelene Bott, MDiv [email or web address] [phone]

## 2023-05-28 NOTE — Plan of Care (Signed)
  Problem: Respiratory: Goal: Ability to maintain adequate ventilation will improve Outcome: Progressing   Problem: Clinical Measurements: Goal: Cardiovascular complication will be avoided Outcome: Progressing

## 2023-05-28 NOTE — IPAL (Signed)
Interdisciplinary Goals of Care Family Meeting   Date carried out: 2023/06/05  Location of the meeting: Bedside  Member's involved: Bedside Registered Nurse and Family Member or next of kin, Physician Assistant  Durable Power of Attorney or acting medical decision maker: Pt's two daughters and husband at bedside    Discussion: We discussed goals of care for Emily Castaneda. I had an extensive bedside discussion with Emily Castaneda family including two daughters and husband at bedside regarding the current circumstances, poor prognosis, and likely poor quality of life for her.  We also discussed the patient's prior wishes under circumstances such as this and the fact that she had made it known that she would not wish for heroic aggressive measures in the event she had a critical illness.  The family has decided to offer full comfort care for her. They have been fully updated on the process and expectations and all questions have been answered.   Code status:   Code Status: Do not attempt resuscitation (DNR) - Comfort care   Disposition: In-patient comfort care  Time spent for the meeting: 15 minutes.   Rutherford Guys, PA - C [facility] For pager details, please see AMION or use Epic chat  After 1900, please call [facility] for cross coverage needs 06-05-2023, 9:10 AM

## 2023-05-28 NOTE — Progress Notes (Signed)
Pt extubated to comfort care measures.

## 2023-05-31 LAB — CULTURE, BLOOD (ROUTINE X 2)
Culture: NO GROWTH
Special Requests: ADEQUATE

## 2023-06-21 NOTE — Death Summary Note (Signed)
DEATH SUMMARY   Patient Details  Name: Emily Castaneda MRN: [id number] DOB: [birth year]  Admission/Discharge Information   Admit Date:  June 21, 2023  Date of Death: Date of Death: 06-23-2023  Time of Death: Time of Death: 2156/08/04  Length of Stay: 3  Referring Physician: Judi Saa, DO   Reason(s) for Hospitalization  Sepsis due to UTI  Diagnoses  Preliminary cause of death:  Secondary Diagnoses (including complications and co-morbidities):  Principal Problem:   Sepsis secondary to UTI [facility]) Active Problems:   Hypothyroidism   Dyslipidemia   Bipolar 1 disorder, depressed (HCC)   Depression   OSA on CPAP   AKI (acute kidney injury) (HCC)   Acute metabolic encephalopathy   Acute respiratory failure with hypoxia (HCC)   Septic shock (HCC) Hypomagnesemia Hypophosphatemia Anemia Lactic acidosis Hyperglycemia Septic shock History of dementia  Hypothyroidism Anuria hyperlipidemia  Brief Hospital Course (including significant findings, care, treatment, and services provided and events leading to death)  Emily Castaneda is a 88 y.o. year old female  w/ pertinent PMH dementia, HLD, hypothyroidism, OSA on CPAP, depression presents to [facility] ED on 06/21/2023 w/ urosepsis.   On 06/21/2023 patient having slow speech and tremors. Noted to be altered. EMS called and transported to [facility]. Febrile 101 F and tachy 110s. LA 4.3. UA w/ leukocytosis and bacteria. CXR unremarkable. Sepsis protocol initiated: cultures obtained, iv fluids, and started on rocephin. TRH to admit. Throughout the night patient RR in 20-30s and now tachycardic 140s. BP starting to become soft. LA 6.6 from 3.7. Patient more altered. PCCM consulted.   After admission to the hospital she declined and was transferred to the ICU. Despite maximal aggressive care with mechanical ventilation, IVF resuscitation, antibiotics, pressors she failed to improve. Family elected to focus on comfort, and she was compassionately extubated on June 23, 2023. She  passed away with family at bedside.    Pertinent Labs and Studies  Significant Diagnostic Studies US RENAL  Result Date: 23-Jun-2023 CLINICAL DATA:  Acute kidney injury. EXAM: RENAL / URINARY TRACT ULTRASOUND COMPLETE COMPARISON:  None Available. FINDINGS: Right Kidney: Renal measurements: 9.0 x 5.2 x 4.9 cm = volume: 119.2 mL. Echogenicity within normal limits. No mass or hydronephrosis visualized. Left Kidney: Renal measurements: 10.7 x 4.9 x 5.8 cm = volume: 161.1 mL. Echogenicity within normal limits. No mass or hydronephrosis visualized. Bladder: Appears normal for degree of bladder distention. Other: None. IMPRESSION: Normal renal ultrasound. Electronically Signed   By: Signa Kell M.D.   On: 2023-06-23 06:12   DG Chest Port 1 View  Result Date: 05/27/2023 CLINICAL DATA:  [id number]. Encounter for imaging study to confirm orogastric tube placement. EXAM: PORTABLE CHEST 1 VIEW COMPARISON:  Portable chest earlier today at 3:12 a.m. No old abdomen film. FINDINGS: Chest AP portable at 5:23 a.m.: The patient is now intubated. Tip of the ETT is only 1 cm from the carina. Recommend withdrawing the tube 4 cm to the mid trachea. There is mild cardiomegaly without evidence of CHF. There is increased retrocardiac left lower lobe opacity which could be due to atelectasis, pneumonia or aspiration. Remaining lungs remain clear.  The mediastinum is normally outlined. Aortic atherosclerosis.  No new osseous findings. 5:29 a.m. high transverse portable supine abdomen single view: NGT is in place curving to the left in the stomach with the tip in the proximal gastric fundus, but the side-hole is only just below the hiatus and should be advanced further in. There are overlying telemetry leads and electrical pads. The visualized  bowel pattern is nonobstructive. No free air is seen. No pathologic calcifications or other significant radiographic findings. IMPRESSION: 1. ETT tip only 1 cm from the carina. Recommend  withdrawing the tube 4 cm to the mid trachea. 2. NGT tip in the proximal gastric fundus, but the side-hole is only just below the hiatus and should be advanced further in. 3. Increased retrocardiac left lower lobe opacity which could be due to atelectasis, pneumonia or aspiration. 4. In all other respects no further changes. 5. These results will be called to the ordering clinician or representative by the Radiologist Assistant, and communication documented in the PACS or [employer]. Electronically Signed   By: Almira Bar M.D.   On: 05/27/2023 06:11   DG Abd 1 View  Result Date: 05/27/2023 CLINICAL DATA:  [id number]. Encounter for imaging study to confirm orogastric tube placement. EXAM: PORTABLE CHEST 1 VIEW COMPARISON:  Portable chest earlier today at 3:12 a.m. No old abdomen film. FINDINGS: Chest AP portable at 5:23 a.m.: The patient is now intubated. Tip of the ETT is only 1 cm from the carina. Recommend withdrawing the tube 4 cm to the mid trachea. There is mild cardiomegaly without evidence of CHF. There is increased retrocardiac left lower lobe opacity which could be due to atelectasis, pneumonia or aspiration. Remaining lungs remain clear.  The mediastinum is normally outlined. Aortic atherosclerosis.  No new osseous findings. 5:29 a.m. high transverse portable supine abdomen single view: NGT is in place curving to the left in the stomach with the tip in the proximal gastric fundus, but the side-hole is only just below the hiatus and should be advanced further in. There are overlying telemetry leads and electrical pads. The visualized bowel pattern is nonobstructive. No free air is seen. No pathologic calcifications or other significant radiographic findings. IMPRESSION: 1. ETT tip only 1 cm from the carina. Recommend withdrawing the tube 4 cm to the mid trachea. 2. NGT tip in the proximal gastric fundus, but the side-hole is only just below the hiatus and should be advanced further in. 3.  Increased retrocardiac left lower lobe opacity which could be due to atelectasis, pneumonia or aspiration. 4. In all other respects no further changes. 5. These results will be called to the ordering clinician or representative by the Radiologist Assistant, and communication documented in the PACS or [employer]. Electronically Signed   By: Almira Bar M.D.   On: 05/27/2023 06:11   CT Head Wo Contrast  Result Date: 05/27/2023 CLINICAL DATA:  Mental status change with unknown cause EXAM: CT HEAD WITHOUT CONTRAST TECHNIQUE: Contiguous axial images were obtained from the base of the skull through the vertex without intravenous contrast. RADIATION DOSE REDUCTION: This exam was performed according to the departmental dose-optimization program which includes automated exposure control, adjustment of the mA and/or kV according to patient size and/or use of iterative reconstruction technique. COMPARISON:  01/24/2018 FINDINGS: Brain: No evidence of acute infarction, hemorrhage, hydrocephalus, extra-axial collection or mass lesion/mass effect. Advanced atrophy with ventriculomegaly. Vascular: No hyperdense vessel or unexpected calcification. Skull: Normal. Negative for fracture or focal lesion. Sinuses/Orbits: No acute finding. IMPRESSION: 1. No acute or reversible finding. 2. Advanced brain atrophy. Electronically Signed   By: Tiburcio Pea M.D.   On: 05/27/2023 04:49   DG Chest Port 1 View  Result Date: 05/27/2023 CLINICAL DATA:  Sepsis.  UTI EXAM: PORTABLE CHEST 1 VIEW COMPARISON:  Yesterday FINDINGS: Normal heart size and mediastinal contours. No acute infiltrate or edema. No effusion or pneumothorax.  No acute osseous findings. IMPRESSION: Stable exam.  No evidence of acute disease. Electronically Signed   By: Tiburcio Pea M.D.   On: 05/27/2023 04:48   DG Chest Port 1 View  Result Date: 06/15/2023 CLINICAL DATA:  Sepsis EXAM: PORTABLE CHEST 1 VIEW COMPARISON:  None Available. FINDINGS: Lungs are  well expanded, symmetric, and clear. No pneumothorax or pleural effusion. Cardiac size within normal limits. Pulmonary vascularity is normal. Osseous structures are age-appropriate. No acute bone abnormality. IMPRESSION: No active disease. Electronically Signed   By: Helyn Numbers M.D.   On: Jun 15, 2023 19:01    Microbiology Recent Results (from the past 240 hour(s))  Resp panel by RT-PCR (RSV, Flu A&B, Covid) Anterior Nasal Swab     Status: None   Collection Time: 2023-06-15  4:35 PM   Specimen: Anterior Nasal Swab  Result Value Ref Range Status   SARS Coronavirus 2 by RT PCR NEGATIVE NEGATIVE Final   Influenza A by PCR NEGATIVE NEGATIVE Final   Influenza B by PCR NEGATIVE NEGATIVE Final    Comment: (NOTE) The Xpert Xpress SARS-CoV-2/FLU/RSV plus assay is intended as an aid in the diagnosis of influenza from Nasopharyngeal swab specimens and should not be used as a sole basis for treatment. Nasal washings and aspirates are unacceptable for Xpert Xpress SARS-CoV-2/FLU/RSV testing.  Fact Sheet for Patients: BloggerCourse.com  Fact Sheet for Healthcare Providers: SeriousBroker.it  This test is not yet approved or cleared by the [place] FDA and has been authorized for detection and/or diagnosis of SARS-CoV-2 by FDA under an Emergency Use Authorization (EUA). This EUA will remain in effect (meaning this test can be used) for the duration of the COVID-19 declaration under Section 564(b)(1) of the Act, 21 U.S.C. section 360bbb-3(b)(1), unless the authorization is terminated or revoked.     Resp Syncytial Virus by PCR NEGATIVE NEGATIVE Final    Comment: (NOTE) Fact Sheet for Patients: BloggerCourse.com  Fact Sheet for Healthcare Providers: SeriousBroker.it  This test is not yet approved or cleared by the [place] FDA and has been authorized for detection and/or diagnosis of  SARS-CoV-2 by FDA under an Emergency Use Authorization (EUA). This EUA will remain in effect (meaning this test can be used) for the duration of the COVID-19 declaration under Section 564(b)(1) of the Act, 21 U.S.C. section 360bbb-3(b)(1), unless the authorization is terminated or revoked.  Performed at [facility], [address] [place], [place] [address]   Blood Culture (routine x 2)     Status: None (Preliminary result)   Collection Time: 2023/06/15  4:35 PM   Specimen: BLOOD  Result Value Ref Range Status   Specimen Description BLOOD SITE NOT SPECIFIED  Final   Special Requests   Final    BOTTLES DRAWN AEROBIC AND ANAEROBIC Blood Culture adequate volume   Culture   Final    NO GROWTH 3 DAYS Performed at [facility], [address] [place], [place] [address]    Report Status PENDING  Incomplete  Urine Culture     Status: Abnormal   Collection Time: 06/15/23  4:35 PM   Specimen: Urine, Random  Result Value Ref Range Status   Specimen Description URINE, RANDOM  Final   Special Requests NONE Reflexed from [id number]  Final   Culture (A)  Final    <10,000 COLONIES/mL INSIGNIFICANT GROWTH Performed at [facility], [address] [place], [place] [address]    Report Status 05/27/2023 FINAL  Final  MRSA Next Gen by  PCR, Nasal     Status: None   Collection Time: 05/27/23  6:39 AM   Specimen: Nasal Mucosa; Nasal Swab  Result Value Ref Range Status   MRSA by PCR Next Gen NOT DETECTED NOT DETECTED Final    Comment: (NOTE) The GeneXpert MRSA Assay (FDA approved for NASAL specimens only), is one component of a comprehensive MRSA colonization surveillance program. It is not intended to diagnose MRSA infection nor to guide or monitor treatment for MRSA infections. Test performance is not FDA approved in patients less than 79 years old. Performed at [facility], [address] [place], [place] [address]     Lab Basic Metabolic Panel: Recent Labs  Lab  06/11/2023 1635 05/27/23 0235 05/27/23 0430 05/27/23 0454 05/27/23 0646 05/27/23 1112 05/27/23 1550 06/10/2023 0246  NA 143 141   < > 142 144 144 141 137  K 3.5 3.9   < > 3.3* 3.2* 2.9* 4.2 4.0  CL 104 108  --   --   --  111 109 100  CO2 22 17*  --   --   --  14* 16* 18*  GLUCOSE 133* 117*  --   --   --  115* 115* 218*  BUN 22 20  --   --   --  24* 25* 29*  CREATININE 1.21* 1.40*  --   --   --  2.04* 2.31* 3.06*  CALCIUM 8.9 8.5*  --   --   --  8.2* 7.8* 8.4*  MG  --   --   --   --   --   --   --  1.0*  PHOS  --   --   --   --   --   --   --  2.2*   < > = values in this interval not displayed.   Liver Function Tests: Recent Labs  Lab Jun 11, 2023 1635 05/27/23 0235  AST 28 34  ALT 17 17  ALKPHOS 62 71  BILITOT 0.3 0.5  PROT 6.1* 6.0*  ALBUMIN 2.8* 2.4*   No results for input(s): "LIPASE", "AMYLASE" in the last 168 hours. Recent Labs  Lab 05/27/23 0447  AMMONIA 33   CBC: Recent Labs  Lab June 11, 2023 1635 05/27/23 0235 05/27/23 0430 05/27/23 0454 05/27/23 0646 05/27/2023 0246  WBC 9.3 2.0*  --   --   --  27.6*  NEUTROABS 8.6*  --   --   --   --   --   HGB 13.1 13.4 12.6 11.2* 12.2 11.6*  HCT 40.4 40.8 37.0 33.0* 36.0 35.2*  MCV 97.1 97.4  --   --   --  96.2  PLT 194 156  --   --   --  PLATELET CLUMPS NOTED ON SMEAR, UNABLE TO ESTIMATE   Cardiac Enzymes: No results for input(s): "CKTOTAL", "CKMB", "CKMBINDEX", "TROPONINI" in the last 168 hours. Sepsis Labs: Recent Labs  Lab 06/11/2023 1635 2023/06/11 1708 05/27/23 0235 05/27/23 0241 05/27/23 0447 05/27/23 0842 05/27/23 1112 05/27/23 1550 05/31/2023 0246  PROCALCITON  --   --   --   --  23.01  --   --   --   --   WBC 9.3  --  2.0*  --   --   --   --   --  27.6*  LATICACIDVEN  --    < >  --    < > 8.7* 7.5* 8.2* 7.9*  --    < > = values  in this interval not displayed.    Procedures/Operations   Intubation    Steffanie Dunn 05/30/2023, 8:52 AM

## 2023-06-21 DEATH — deceased
